# Patient Record
Sex: Female | Born: 1945
Health system: Southern US, Community
[De-identification: ages and names within clinical notes are randomized; demographics above are authoritative.]

## PROBLEM LIST (undated history)

## (undated) DIAGNOSIS — N760 Acute vaginitis: Secondary | ICD-10-CM

## (undated) DIAGNOSIS — E8941 Symptomatic postprocedural ovarian failure: Secondary | ICD-10-CM

## (undated) DIAGNOSIS — B029 Zoster without complications: Secondary | ICD-10-CM

## (undated) DIAGNOSIS — G4700 Insomnia, unspecified: Secondary | ICD-10-CM

## (undated) DIAGNOSIS — F411 Generalized anxiety disorder: Secondary | ICD-10-CM

## (undated) DIAGNOSIS — K589 Irritable bowel syndrome without diarrhea: Secondary | ICD-10-CM

## (undated) HISTORY — DX: Symptomatic postprocedural ovarian failure: E89.41

## (undated) HISTORY — PX: ABDOMINAL HYSTERECTOMY: SHX81

## (undated) HISTORY — DX: Generalized anxiety disorder: F41.1

## (undated) HISTORY — DX: Zoster without complications: B02.9

## (undated) HISTORY — PX: EYE SURGERY: SHX253

## (undated) HISTORY — PX: CATARACT EXTRACTION: SUR2

## (undated) HISTORY — PX: TONSILLECTOMY: SUR1361

## (undated) HISTORY — PX: COLECTOMY: SHX59

## (undated) HISTORY — DX: Insomnia, unspecified: G47.00

## (undated) HISTORY — DX: Acute vaginitis: N76.0

## (undated) HISTORY — DX: Irritable bowel syndrome without diarrhea: K58.9

## (undated) HISTORY — PX: TONSILLECTOMY: SHX5217

---

## 2001-07-03 ENCOUNTER — Ambulatory Visit (HOSPITAL_COMMUNITY): Admission: RE | Admit: 2001-07-03 | Discharge: 2001-07-03 | Payer: Self-pay | Admitting: Gastroenterology

## 2001-08-14 ENCOUNTER — Other Ambulatory Visit: Admission: RE | Admit: 2001-08-14 | Discharge: 2001-08-14 | Payer: Self-pay | Admitting: *Deleted

## 2002-11-04 ENCOUNTER — Other Ambulatory Visit: Admission: RE | Admit: 2002-11-04 | Discharge: 2002-11-04 | Payer: Self-pay | Admitting: *Deleted

## 2003-12-09 ENCOUNTER — Other Ambulatory Visit: Admission: RE | Admit: 2003-12-09 | Discharge: 2003-12-09 | Payer: Self-pay | Admitting: *Deleted

## 2004-04-12 ENCOUNTER — Encounter: Admission: RE | Admit: 2004-04-12 | Discharge: 2004-04-12 | Payer: Self-pay | Admitting: Family Medicine

## 2005-02-02 ENCOUNTER — Other Ambulatory Visit: Admission: RE | Admit: 2005-02-02 | Discharge: 2005-02-02 | Payer: Self-pay | Admitting: *Deleted

## 2006-02-21 ENCOUNTER — Other Ambulatory Visit: Admission: RE | Admit: 2006-02-21 | Discharge: 2006-02-21 | Payer: Self-pay | Admitting: *Deleted

## 2007-03-28 ENCOUNTER — Other Ambulatory Visit: Admission: RE | Admit: 2007-03-28 | Discharge: 2007-03-28 | Payer: Self-pay | Admitting: *Deleted

## 2007-08-13 ENCOUNTER — Encounter: Admission: RE | Admit: 2007-08-13 | Discharge: 2007-08-13 | Payer: Self-pay | Admitting: Family Medicine

## 2008-05-07 ENCOUNTER — Emergency Department (HOSPITAL_COMMUNITY): Admission: EM | Admit: 2008-05-07 | Discharge: 2008-05-07 | Payer: Self-pay | Admitting: Family Medicine

## 2008-08-14 ENCOUNTER — Ambulatory Visit: Payer: Self-pay | Admitting: Internal Medicine

## 2008-08-14 DIAGNOSIS — F411 Generalized anxiety disorder: Secondary | ICD-10-CM

## 2008-08-14 DIAGNOSIS — J069 Acute upper respiratory infection, unspecified: Secondary | ICD-10-CM | POA: Insufficient documentation

## 2008-08-14 DIAGNOSIS — E8941 Symptomatic postprocedural ovarian failure: Secondary | ICD-10-CM | POA: Insufficient documentation

## 2008-08-14 HISTORY — DX: Generalized anxiety disorder: F41.1

## 2008-08-14 HISTORY — DX: Symptomatic postprocedural ovarian failure: E89.41

## 2008-10-03 ENCOUNTER — Ambulatory Visit: Payer: Self-pay | Admitting: Family Medicine

## 2008-10-03 ENCOUNTER — Telehealth: Payer: Self-pay | Admitting: Family Medicine

## 2008-10-04 ENCOUNTER — Telehealth: Payer: Self-pay | Admitting: Family Medicine

## 2009-04-23 ENCOUNTER — Telehealth: Payer: Self-pay | Admitting: Internal Medicine

## 2009-08-30 ENCOUNTER — Telehealth: Payer: Self-pay | Admitting: Internal Medicine

## 2009-08-31 ENCOUNTER — Telehealth: Payer: Self-pay | Admitting: Internal Medicine

## 2009-09-01 ENCOUNTER — Telehealth: Payer: Self-pay

## 2009-09-07 ENCOUNTER — Ambulatory Visit: Payer: Self-pay | Admitting: Internal Medicine

## 2009-09-07 DIAGNOSIS — G4700 Insomnia, unspecified: Secondary | ICD-10-CM | POA: Insufficient documentation

## 2009-09-07 DIAGNOSIS — K589 Irritable bowel syndrome without diarrhea: Secondary | ICD-10-CM

## 2009-09-07 HISTORY — DX: Irritable bowel syndrome, unspecified: K58.9

## 2009-09-07 HISTORY — DX: Insomnia, unspecified: G47.00

## 2009-09-07 LAB — CONVERTED CEMR LAB
BUN: 16 mg/dL (ref 6–23)
Basophils Absolute: 0 10*3/uL (ref 0.0–0.1)
Bilirubin, Direct: 0 mg/dL (ref 0.0–0.3)
Cholesterol: 173 mg/dL (ref 0–200)
Eosinophils Relative: 1.2 % (ref 0.0–5.0)
Glucose, Bld: 91 mg/dL (ref 70–99)
HCT: 40 % (ref 36.0–46.0)
Hemoglobin: 13.2 g/dL (ref 12.0–15.0)
Lymphocytes Relative: 30.8 % (ref 12.0–46.0)
MCHC: 33.1 g/dL (ref 30.0–36.0)
Monocytes Relative: 9.3 % (ref 3.0–12.0)
Neutro Abs: 3.2 10*3/uL (ref 1.4–7.7)
Neutrophils Relative %: 58.3 % (ref 43.0–77.0)
RBC: 4.44 M/uL (ref 3.87–5.11)
RDW: 12.6 % (ref 11.5–14.6)
TSH: 1.42 microintl units/mL (ref 0.35–5.50)
Total Bilirubin: 0.6 mg/dL (ref 0.3–1.2)
Total Protein: 6.7 g/dL (ref 6.0–8.3)

## 2010-02-04 ENCOUNTER — Emergency Department (HOSPITAL_COMMUNITY): Admission: EM | Admit: 2010-02-04 | Discharge: 2010-02-05 | Payer: Self-pay | Admitting: Emergency Medicine

## 2010-02-22 ENCOUNTER — Ambulatory Visit: Payer: Self-pay | Admitting: Cardiology

## 2010-02-22 ENCOUNTER — Ambulatory Visit: Payer: Self-pay

## 2010-02-22 ENCOUNTER — Encounter (INDEPENDENT_AMBULATORY_CARE_PROVIDER_SITE_OTHER): Payer: Self-pay | Admitting: Neurology

## 2010-02-22 ENCOUNTER — Ambulatory Visit (HOSPITAL_COMMUNITY): Admission: RE | Admit: 2010-02-22 | Discharge: 2010-02-22 | Payer: Self-pay | Admitting: Neurology

## 2010-05-12 ENCOUNTER — Telehealth: Payer: Self-pay | Admitting: Internal Medicine

## 2010-06-04 ENCOUNTER — Ambulatory Visit: Payer: Self-pay | Admitting: Family Medicine

## 2010-06-04 DIAGNOSIS — R300 Dysuria: Secondary | ICD-10-CM

## 2010-06-04 DIAGNOSIS — N760 Acute vaginitis: Secondary | ICD-10-CM | POA: Insufficient documentation

## 2010-06-04 HISTORY — DX: Acute vaginitis: N76.0

## 2010-06-04 LAB — CONVERTED CEMR LAB
Blood in Urine, dipstick: NEGATIVE
Glucose, Urine, Semiquant: NEGATIVE
Ketones, urine, test strip: NEGATIVE
Protein, U semiquant: NEGATIVE
Specific Gravity, Urine: <1.005

## 2010-06-06 ENCOUNTER — Telehealth: Payer: Self-pay | Admitting: Internal Medicine

## 2010-06-07 ENCOUNTER — Ambulatory Visit (HOSPITAL_COMMUNITY): Payer: Self-pay | Admitting: Psychology

## 2010-07-14 ENCOUNTER — Ambulatory Visit: Payer: Self-pay | Admitting: Internal Medicine

## 2010-07-15 ENCOUNTER — Telehealth: Payer: Self-pay | Admitting: Internal Medicine

## 2010-07-24 LAB — HM PAP SMEAR: HM Pap smear: NORMAL

## 2010-08-25 NOTE — Assessment & Plan Note (Signed)
Summary: URI/dm   Vital Signs:  Patient profile:   65 year old female Weight:      110 pounds Temp:     98.2 degrees F oral BP sitting:   108 / 72  (left arm) Cuff size:   regular  Vitals Entered By: Duard Brady LPN (July 14, 2010 11:13 AM) CC: c/o chest congestion and weakness Is Patient Diabetic? No   CC:  c/o chest congestion and weakness.  History of Present Illness: 65 year old patient with a 7-day history of head and chest congestion, and minimally productive cough.  She has weakness, chest congestion, and his general sense of unwellness.  There is been no documented fever.  She denies any purulent sputum production, chest pain or shortness of breath.  Allergies: 1)  ! Compazine  Past History:  Past Medical History: Reviewed history from 08/14/2008 and no changes required. surgical menopause history of endometriosis Anxiety insomnia neck pain IBS  Family History: Reviewed history from 08/14/2008 and no changes required. father died age 82, lung cancer mother died age 52 complications of Hodgkin's disease  No siblings  Review of Systems       The patient complains of anorexia, hoarseness, and prolonged cough.  The patient denies fever, weight loss, weight gain, vision loss, decreased hearing, chest pain, syncope, dyspnea on exertion, peripheral edema, headaches, hemoptysis, abdominal pain, melena, hematochezia, severe indigestion/heartburn, hematuria, incontinence, genital sores, muscle weakness, suspicious skin lesions, transient blindness, difficulty walking, depression, unusual weight change, abnormal bleeding, enlarged lymph nodes, angioedema, and breast masses.    Physical Exam  General:  Well-developed,well-nourished,in no acute distress; alert,appropriate and cooperative throughout examination Head:  Normocephalic and atraumatic without obvious abnormalities. No apparent alopecia or balding. Eyes:  No corneal or conjunctival inflammation noted.  EOMI. Perrla. Funduscopic exam benign, without hemorrhages, exudates or papilledema. Vision grossly normal. Ears:  External ear exam shows no significant lesions or deformities.  Otoscopic examination reveals clear canals, tympanic membranes are intact bilaterally without bulging, retraction, inflammation or discharge. Hearing is grossly normal bilaterally. Nose:  External nasal examination shows no deformity or inflammation. Nasal mucosa are pink and moist without lesions or exudates. Mouth:  Oral mucosa and oropharynx without lesions or exudates.  Teeth in good repair. Neck:  No deformities, masses, or tenderness noted. Lungs:  Normal respiratory effort, chest expands symmetrically. Lungs are clear to auscultation, no crackles or wheezes. O2 saturation 99% Heart:  Normal rate and regular rhythm. S1 and S2 normal without gallop, murmur, click, rub or other extra sounds. Msk:  No deformity or scoliosis noted of thoracic or lumbar spine.   Extremities:  No clubbing, cyanosis, edema, or deformity noted with normal full range of motion of all joints.     Impression & Recommendations:  Problem # 1:  URI (ICD-465.9)  Her updated medication list for this problem includes:    Hydrocodone-homatropine 5-1.5 Mg/43ml Syrp (Hydrocodone-homatropine) .Marland Kitchen... 1 teaspoon every 6 hours as needed for cough or congestion  Her updated medication list for this problem includes:    Hydrocodone-homatropine 5-1.5 Mg/62ml Syrp (Hydrocodone-homatropine) .Marland Kitchen... 1 teaspoon every 6 hours as needed for cough or congestion  Complete Medication List: 1)  Prozac 10 Mg Caps (Fluoxetine hcl) .... 1/2 once daily 2)  Premarin 0.9 Mg Tabs (Estrogens conjugated) .Marland Kitchen.. 1 once daily 3)  Trazodone Hcl 50 Mg Tabs (Trazodone hcl) .... 1/2 at bedtime 4)  Alprazolam 0.25 Mg Tbdp (Alprazolam) .Marland Kitchen.. 1 twice daily 5)  Methocarbamol 500 Mg Tabs (Methocarbamol) .Marland Kitchen.. 1 once daily  as needed 6)  Fluticasone Propionate 50 Mcg/act Susp (Fluticasone  propionate) .... 2 sprays each nostril once daily 7)  Hydrocodone-homatropine 5-1.5 Mg/35ml Syrp (Hydrocodone-homatropine) .Marland Kitchen.. 1 teaspoon every 6 hours as needed for cough or congestion  Patient Instructions: 1)  Get plenty of rest, drink lots of clear liquids, and use Tylenol or Ibuprofen for fever and comfort. Return in 7-10 days if you're not better:sooner if you're feeling worse. 2)  Please schedule a follow-up appointment in 6 months for annual exam Prescriptions: HYDROCODONE-HOMATROPINE 5-1.5 MG/5ML SYRP (HYDROCODONE-HOMATROPINE) 1 teaspoon every 6 hours as needed for cough or congestion  #6 oz x 0   Entered and Authorized by:   Gordy Savers  MD   Signed by:   Gordy Savers  MD on 07/14/2010   Method used:   Print then Give to Patient   RxID:   [id number] FLUTICASONE PROPIONATE 50 MCG/ACT  SUSP (FLUTICASONE PROPIONATE) 2 sprays each nostril once daily  #1 x 4   Entered and Authorized by:   Gordy Savers  MD   Signed by:   Gordy Savers  MD on 07/14/2010   Method used:   Print then Give to Patient   RxID:   [id number] ALPRAZOLAM 0.25 MG TBDP (ALPRAZOLAM) 1 twice daily  #180 x 3   Entered and Authorized by:   Gordy Savers  MD   Signed by:   Gordy Savers  MD on 07/14/2010   Method used:   Print then Give to Patient   RxID:   [id number] TRAZODONE HCL 50 MG TABS (TRAZODONE HCL) 1/2 at bedtime  #90 x 4   Entered and Authorized by:   Gordy Savers  MD   Signed by:   Gordy Savers  MD on 07/14/2010   Method used:   Print then Give to Patient   RxID:   [id number] PREMARIN 0.9 MG TABS (ESTROGENS CONJUGATED) 1 once daily  #90 x 4   Entered and Authorized by:   Gordy Savers  MD   Signed by:   Gordy Savers  MD on 07/14/2010   Method used:   Print then Give to Patient   RxID:   [id number] PROZAC 10 MG CAPS (FLUOXETINE HCL) 1/2 once daily  #90 x 4   Entered and Authorized by:   Gordy Savers   MD   Signed by:   Gordy Savers  MD on 07/14/2010   Method used:   Print then Give to Patient   RxID:   [id number]    Orders Added: 1)  Est. Patient Level III [57322]

## 2010-08-25 NOTE — Progress Notes (Signed)
Summary: Call-A-Nurse Report  Call-A-Nurse Triage Call Report Triage Record Num: [id number] Operator: Caswell Corwin Patient Name: Elizabeth Ward Call Date & Time: 06/04/2010 10:05:29AM Patient Phone: [phone] PCP: Patient Gender: Female PCP Fax : Patient DOB: [birth year] [facility] Name: [facility] Reason for Call: Pt callint that she has UTI sx with bladder pain. Sx started 06/02/10 and frequency and urgency and burning. Last voided  ~ 1000. Triaged U/S SX female having above sx. No S/O per office guidelines. Pt inst to go to [facility] for eval. and then called office @ [phone] and transferred to [facility] for appt at [facility]. Protocol(s) Used: Urinary Symptoms - Female Recommended Outcome per Protocol: See Provider within 4 hours Reason for Outcome: Urinary tract symptoms AND any flank or low back pain Care Advice:  ~ Another adult should drive. 06/04/2010 10:18:05AM Page 1 of 1 [email or web address]

## 2010-08-25 NOTE — Progress Notes (Signed)
Summary: cough (appt [facility] at [facility])  Phone Note Call from Patient Call back at Home Phone [phone]   Caller: Patient Call For: DrTabori Summary of Call: c/o possible bronchitis, the past few days fatigue is worse,dry cough, sore throat, glands in throat swollen, chest pain with cough. C/o symptoms since end of Jan but have recently gotten worse, expectorants not helping. I scheduled appt with [facility] Initial call taken by: Liane Comber,  October 03, 2008 9:48 AM  Follow-up for Phone Call        agree Follow-up by: Neena Rhymes MD,  October 03, 2008 9:53 AM

## 2010-08-25 NOTE — Progress Notes (Signed)
  Phone Note Call from Patient   Summary of Call: was seen yesterday and calls today stating no better- sever sorethroat, sinusitis like signs with facial pain- cannot take z pack--[facility]-cough and chest congestion Initial call taken by: Willy Eddy, LPN,  July 15, 2010 2:57 PM  Follow-up for Phone Call        doxycycline 100 mg #20 one BID Follow-up by: Gordy Savers  MD,  July 15, 2010 4:48 PM    New/Updated Medications: DOXYCYCLINE HYCLATE 100 MG CAPS (DOXYCYCLINE HYCLATE) 1 two times a day for 10 days Prescriptions: DOXYCYCLINE HYCLATE 100 MG CAPS (DOXYCYCLINE HYCLATE) 1 two times a day for 10 days  #20 x 0   Entered by:   Willy Eddy, LPN   Authorized by:   Gordy Savers  MD   Signed by:   Willy Eddy, LPN on 40/98/1191   Method used:   [facility]        [facility] [address] [phone]* (retail)       [address]       [place], [place]  [address]       Ph: [phone] or [phone]       Fax: [phone]   RxID:   [id number]   Appended Document:  pt was instructed to check witgh pharmacy after 5pm if she didnt receive call from [place]

## 2010-08-25 NOTE — Progress Notes (Signed)
Summary[facility]  Phone Note Call from Patient   Caller: Patient Call For: Gordy Savers  MD Summary of Call: Spoke with pt. about Alprazolam , she has increased dose to 2 once daily due to family issues. Pharmacy will not refill at original dose, it is to early. She has appt next tues here, and is out of meds now. Will you ok increase in doseage and early refill?  KIK Initial call taken by: Raechel Ache, RN,  September 01, 2009 10:15 AM  Follow-up for Phone Call        [facility] in [facility] Dr. Amador Cunas , notified pt.  Encouraged to keep appt next week.  KIK Follow-up by: Duard Brady RN,  September 02, 2009 8:55 AM    alprazolam 0.25  #60 one twice daily as needed

## 2010-08-25 NOTE — Progress Notes (Signed)
Summary: Pt req early refill of Alprazolam  Phone Note Call from Patient Call back at Home Phone [phone]   Caller: Patient Summary of Call: Pt req early refill of Alprazolam .25mg . Pt says that she has been taking more than dosage instruction. Pt has had a lot of anxiety due family situation. Pt req 90 day supply. [facility] in to [facility].  Initial call taken by: Lucy Antigua,  August 30, 2009 10:29 AM    Prescriptions: ALPRAZOLAM 0.25 MG TBDP (ALPRAZOLAM) 1 once daily as needed  #90 x 0   Entered by:   Raechel Ache, RN   Authorized by:   Gordy Savers  MD   Signed by:   Raechel Ache, RN on 08/31/2009   Method used:   Printed then faxed to ...       [facility] [address] [phone]* (retail)       [address]       [place], [place]  [address]       Ph: [phone] or [phone]       Fax: [phone]   RxID:   [id number]  #90  needs ROV    , Called pt , informed of 90 day rx only , needs ROV for future Rx.  Will call back and make appt.  KIK

## 2010-08-25 NOTE — Progress Notes (Signed)
Summary: REQ FOR REFILL / RETURN CALL  Phone Note Call from Patient   Caller: Patient [phone] Reason for Call: Talk to Nurse, Talk to Doctor Summary of Call: Pt called to adv that she has been taking approx 2 alprazolam (xanax) tablets per day.... Pt increased amt she was taking daily due to current family stresses and increased anxiety.... Pt adv that she would need to have dosage increased because she has been taking an average of 2 pills per day.Marland KitchenMarland KitchenPt was adv that she may need to make an appt to obtain an increase in the dosage amt because she 1) would be increasing her dosage.... 2) she hasn't been seen by Dr Kirtland Bouchard in over a year...Marland KitchenMarland KitchenMarland Kitchen Pt scheduled an OV on 09/07/2009 @ 1:45pm w/ Dr Kirtland Bouchard for med ck / refills.  Pt states that she doesn't understand why Rx hasn't been called into pharmacy ([facility] [address]... Pharmacy advised pt that they have not received any refill authorizations from [facility].... Pt req a return call at [phone]  -or-  cell # [phone].  Initial call taken by: Debbra Riding,  August 31, 2009 3:54 PM

## 2010-08-25 NOTE — Assessment & Plan Note (Signed)
Summary: MED CK (REFILLS) // RS   Vital Signs:  Patient profile:   65 year old female Weight:      115 pounds Temp:     98.6 degrees F BP sitting:   108 / 64  (right arm) Cuff size:   regular  Vitals Entered By: Duard Brady LPN (September 07, 2009 1:52 PM) CC: medication review - refills needed , pt increased alprazolam to BID per self due to family stress   CC:  medication review - refills needed  and pt increased alprazolam to BID per self due to family stress.  History of Present Illness: 65 year old patient who established  here approximately  one year ago who is seen today for follow-up.  She has a history of anxiety, depression, surgical metapophysis and also a history of IBS.  She has been sleeping poorly and describes increased situational stress.  Her IBS is also acting up a bit with some increasingly loose and frequent stools  Allergies: 1)  ! Compazine  Past History:  Past Medical History: Reviewed history from 08/14/2008 and no changes required. surgical menopause history of endometriosis Anxiety insomnia neck pain IBS  Past Surgical History: Reviewed history from 08/14/2008 and no changes required. Colectomy partial 1992 for endometrioma Hysterectomy history of endometriosis 1983 Tonsillectomy 1954  Colonoscopy in 1995  Family History: Reviewed history from 08/14/2008 and no changes required. father died age 8, lung cancer mother died age 37 complications of Hodgkin's disease  No siblings  Review of Systems       The patient complains of anorexia and depression.  The patient denies fever, weight loss, weight gain, vision loss, decreased hearing, hoarseness, chest pain, syncope, dyspnea on exertion, peripheral edema, prolonged cough, headaches, hemoptysis, abdominal pain, melena, hematochezia, severe indigestion/heartburn, hematuria, incontinence, genital sores, muscle weakness, suspicious skin lesions, transient blindness, difficulty walking,  unusual weight change, abnormal bleeding, enlarged lymph nodes, angioedema, and breast masses.    Physical Exam  General:  Well-developed,well-nourished,in no acute distress; alert,appropriate and cooperative throughout examination Head:  Normocephalic and atraumatic without obvious abnormalities. No apparent alopecia or balding. Eyes:  No corneal or conjunctival inflammation noted. EOMI. Perrla. Funduscopic exam benign, without hemorrhages, exudates or papilledema. Vision grossly normal. Ears:  External ear exam shows no significant lesions or deformities.  Otoscopic examination reveals clear canals, tympanic membranes are intact bilaterally without bulging, retraction, inflammation or discharge. Hearing is grossly normal bilaterally. Mouth:  Oral mucosa and oropharynx without lesions or exudates.  Teeth in good repair. Neck:  No deformities, masses, or tenderness noted. Chest Wall:  No deformities, masses, or tenderness noted. Lungs:  Normal respiratory effort, chest expands symmetrically. Lungs are clear to auscultation, no crackles or wheezes. Heart:  Normal rate and regular rhythm. S1 and S2 normal without gallop, murmur, click, rub or other extra sounds. Abdomen:  Bowel sounds positive,abdomen soft and non-tender without masses, organomegaly or hernias noted. Msk:  No deformity or scoliosis noted of thoracic or lumbar spine.   Pulses:  R and L carotid,radial,femoral,dorsalis pedis and posterior tibial pulses are full and equal bilaterally Extremities:  No clubbing, cyanosis, edema, or deformity noted with normal full range of motion of all joints.   Skin:  Intact without suspicious lesions or rashes Cervical Nodes:  No lymphadenopathy noted Psych:  depressed affect.  depressed affect.     Impression & Recommendations:  Problem # 1:  ANXIETY (ICD-300.00)  Her updated medication list for this problem includes:    Prozac 10 Mg Caps (Fluoxetine hcl) .Marland KitchenMarland KitchenMarland KitchenMarland Kitchen  1/2 once daily    Trazodone Hcl  50 Mg Tabs (Trazodone hcl) .Marland Kitchen... 1/2 at bedtime    Alprazolam 0.25 Mg Tbdp (Alprazolam) .Marland Kitchen... 1 twice daily    Her updated medication list for this problem includes:    Prozac 10 Mg Caps (Fluoxetine hcl) .Marland Kitchen... 1/2 once daily    Trazodone Hcl 50 Mg Tabs (Trazodone hcl) .Marland Kitchen... 1/2 at bedtime    Alprazolam 0.25 Mg Tbdp (Alprazolam) .Marland Kitchen... 1 twice daily  Orders: Venipuncture (29562) TLB-BMP (Basic Metabolic Panel-BMET) (80048-METABOL) TLB-CBC Platelet - w/Differential (85025-CBCD) TLB-Hepatic/Liver Function Pnl (80076-HEPATIC) TLB-TSH (Thyroid Stimulating Hormone) (84443-TSH) TLB-Cholesterol, Total (82465-CHO)  Problem # 2:  IRRITABLE BOWEL SYNDROME (ICD-564.1)  Orders: Venipuncture (13086) TLB-BMP (Basic Metabolic Panel-BMET) (80048-METABOL) TLB-CBC Platelet - w/Differential (85025-CBCD) TLB-Hepatic/Liver Function Pnl (80076-HEPATIC) TLB-TSH (Thyroid Stimulating Hormone) (84443-TSH) TLB-Cholesterol, Total (82465-CHO)  Problem # 3:  INSOMNIA (ICD-780.52)  Orders: Venipuncture (57846) TLB-BMP (Basic Metabolic Panel-BMET) (80048-METABOL) TLB-CBC Platelet - w/Differential (85025-CBCD) TLB-Hepatic/Liver Function Pnl (80076-HEPATIC) TLB-TSH (Thyroid Stimulating Hormone) (84443-TSH) TLB-Cholesterol, Total (82465-CHO)  Complete Medication List: 1)  Prozac 10 Mg Caps (Fluoxetine hcl) .... 1/2 once daily 2)  Premarin 0.9 Mg Tabs (Estrogens conjugated) .Marland Kitchen.. 1 once daily 3)  Trazodone Hcl 50 Mg Tabs (Trazodone hcl) .... 1/2 at bedtime 4)  Alprazolam 0.25 Mg Tbdp (Alprazolam) .Marland Kitchen.. 1 twice daily 5)  Methocarbamol 500 Mg Tabs (Methocarbamol) .Marland Kitchen.. 1 once daily as needed 6)  Fluticasone Propionate 50 Mcg/act Susp (Fluticasone propionate) .... 2 sprays each nostril once daily  Patient Instructions: 1)  Please schedule a follow-up appointment in 6 months for annual exam 2)  Limit your Sodium (Salt). 3)  It is important that you exercise regularly at least 20 minutes 5 times a week. If you  develop chest pain, have severe difficulty breathing, or feel very tired , stop exercising immediately and seek medical attention. 4)  Schedule your mammogram. 5)  Schedule a colonoscopy/sigmoidoscopy to help detect colon cancer. 6)  Take calcium +Vitamin D daily. Prescriptions: METHOCARBAMOL 500 MG TABS (METHOCARBAMOL) 1 once daily as needed  #50 x 4   Entered and Authorized by:   Gordy Savers  MD   Signed by:   Gordy Savers  MD on 09/07/2009   Method used:   Print then Give to Patient   RxID:   [id number] ALPRAZOLAM 0.25 MG TBDP (ALPRAZOLAM) 1 twice daily  #180 x 4   Entered and Authorized by:   Gordy Savers  MD   Signed by:   Gordy Savers  MD on 09/07/2009   Method used:   Print then Give to Patient   RxID:   [id number] FLUTICASONE PROPIONATE 50 MCG/ACT  SUSP (FLUTICASONE PROPIONATE) 2 sprays each nostril once daily  #1 x 4   Entered and Authorized by:   Gordy Savers  MD   Signed by:   Gordy Savers  MD on 09/07/2009   Method used:   Print then Give to Patient   RxID:   [id number] ALPRAZOLAM 0.25 MG TBDP (ALPRAZOLAM) 1 once daily as needed  #90 x 4   Entered and Authorized by:   Gordy Savers  MD   Signed by:   Gordy Savers  MD on 09/07/2009   Method used:   Print then Give to Patient   RxID:   [id number] TRAZODONE HCL 50 MG TABS (TRAZODONE HCL) 1/2 at bedtime  #90 x 4   Entered and Authorized by:   Gordy Savers  MD   Signed by:  Gordy Savers  MD on 09/07/2009   Method used:   Print then Give to Patient   RxID:   [id number] PREMARIN 0.9 MG TABS (ESTROGENS CONJUGATED) 1 once daily  #90 x 4   Entered and Authorized by:   Gordy Savers  MD   Signed by:   Gordy Savers  MD on 09/07/2009   Method used:   Print then Give to Patient   RxID:   [id number] PROZAC 10 MG CAPS (FLUOXETINE HCL) 1/2 once daily  #90 x 4   Entered and Authorized by:   Gordy Savers  MD    Signed by:   Gordy Savers  MD on 09/07/2009   Method used:   Print then Give to Patient   RxID:   [id number]

## 2010-08-25 NOTE — Assessment & Plan Note (Signed)
Summary: UTI/RCD   Vital Signs:  Patient profile:   65 year old female Weight:      112 pounds BMI:     20.23 Temp:     98.0 degrees F oral Pulse rate:   79 / minute Pulse rhythm:   regular BP sitting:   124 / 70  (left arm) Cuff size:   regular  Vitals Entered By: Lamar Sprinkles, CMA (June 04, 2010 11:19 AM) CC: C/o urinary burning, pain & frequency x 2 days/SD   History of Present Illness: 65 yo with 2 days of increased urinary frequency and dysuria. Mild vaginal irritation, no discharge. No fevers, chill, nausea, vomiting or back pain.  Used to get frequent UTIs, has not had one in awhile. No visible hematuria. No suprapubic pain.    Current Medications (verified): 1)  Prozac 10 Mg Caps (Fluoxetine Hcl) .... 1/2 Once Daily 2)  Premarin 0.9 Mg Tabs (Estrogens Conjugated) .Marland Kitchen.. 1 Once Daily 3)  Trazodone Hcl 50 Mg Tabs (Trazodone Hcl) .... 1/2 At Bedtime 4)  Alprazolam 0.25 Mg Tbdp (Alprazolam) .Marland Kitchen.. 1 Twice Daily 5)  Methocarbamol 500 Mg Tabs (Methocarbamol) .Marland Kitchen.. 1 Once Daily As Needed 6)  Fluticasone Propionate 50 Mcg/act  Susp (Fluticasone Propionate) .... 2 Sprays Each Nostril Once Daily  Allergies (verified): 1)  ! Compazine  Past History:  Past Medical History: Last updated: 09-02-08 surgical menopause history of endometriosis Anxiety insomnia neck pain IBS  Past Surgical History: Last updated: 2008/09/02 Colectomy partial 1992 for endometrioma Hysterectomy history of endometriosis 1983 Tonsillectomy 1954  Colonoscopy in 1995  Family History: Last updated: 02-Sep-2008 father died age 81, lung cancer mother died age 87 complications of Hodgkin's disease  No siblings  Social History: Last updated: 2008-09-02 Married Never Smoked one son  Risk Factors: Smoking Status: never (09/02/08)  Review of Systems      See HPI General:  Denies fever. GI:  Denies abdominal pain. GU:  Complains of dysuria and urinary frequency; denies discharge,  hematuria, and incontinence.  Physical Exam  General:  Well-developed,well-nourished,in no acute distress; alert,appropriate and cooperative throughout examination Abdomen:  NO suprapubic tenderness Genitalia:  normal introitus, no external lesions, no vaginal discharge, mucosa pink and moist, and no vaginal or cervical lesions.   Msk:  No CVA tenderness Psych:  depressed affect.     Impression & Recommendations:  Problem # 1:  DYSURIA (ICD-788.1) Assessment New UA completely negative, will send for culture to rule out infection.  Advised drinking fluids, can use OTC Azo if it helps with symptoms. Orders: UA Dipstick w/o Micro (manual) (16109) T-Culture, Urine ([id number])  Problem # 2:  UNSPECIFIED VAGINITIS AND VULVOVAGINITIS (ICD-616.10) Assessment: New Vaginal exam unremarkable.  Wet prep sent. Orders: Wet Prep ([id number])  Complete Medication List: 1)  Prozac 10 Mg Caps (Fluoxetine hcl) .... 1/2 once daily 2)  Premarin 0.9 Mg Tabs (Estrogens conjugated) .Marland Kitchen.. 1 once daily 3)  Trazodone Hcl 50 Mg Tabs (Trazodone hcl) .... 1/2 at bedtime 4)  Alprazolam 0.25 Mg Tbdp (Alprazolam) .Marland Kitchen.. 1 twice daily 5)  Methocarbamol 500 Mg Tabs (Methocarbamol) .Marland Kitchen.. 1 once daily as needed 6)  Fluticasone Propionate 50 Mcg/act Susp (Fluticasone propionate) .... 2 sprays each nostril once daily   Orders Added: 1)  UA Dipstick w/o Micro (manual) [81002] 2)  T-Culture, Urine [[id number]] 3)  Wet Prep [84696EX] 4)  Est. Patient Level IV [52841]   Not Administered:    Influenza Vaccine not given due to: declined   Laboratory Results   Urine Tests  Date/Time Reported: Lamar Sprinkles, CMA  June 04, 2010 11:25 AM   Routine Urinalysis   Color: yellow Glucose: negative   (Normal Range: Negative) Bilirubin: negative   (Normal Range: Negative) Ketone: negative   (Normal Range: Negative) Spec. Gravity: <1.005   (Normal Range: 1.003-1.035) Blood: negative   (Normal Range: Negative) pH:  6.0   (Normal Range: 5.0-8.0) Protein: negative   (Normal Range: Negative) Urobilinogen: negative   (Normal Range: 0-1) Nitrite: negative   (Normal Range: Negative) Leukocyte Esterace: negative   (Normal Range: Negative)

## 2010-08-25 NOTE — Progress Notes (Signed)
Summary: REFILL REQUEST  Phone Note Refill Request   Refills Requested: Medication #1:  ALPRAZOLAM 0.25 MG TBDP 1 twice daily   Notes: [facility] [address]     Initial call taken by: Debbra Riding,  May 12, 2010 3:57 PM  Follow-up for Phone Call        #90  RF 3 Follow-up by: Gordy Savers  MD,  May 12, 2010 4:39 PM    Prescriptions: ALPRAZOLAM 0.25 MG TBDP (ALPRAZOLAM) 1 twice daily  #180 x 3   Entered by:   Willy Eddy, LPN   Authorized by:   Gordy Savers  MD   Signed by:   Willy Eddy, LPN on 04/54/0981   Method used:   Telephoned to ...       [facility] [address] [phone]* (retail)       [address]       [place], [place]  [address]       Ph: [phone] or [phone]       Fax: [phone]   RxID:   [id number]

## 2010-09-23 ENCOUNTER — Other Ambulatory Visit: Payer: Self-pay | Admitting: Internal Medicine

## 2010-10-08 LAB — CBC
HCT: 39 % (ref 36.0–46.0)
Hemoglobin: 13.2 g/dL (ref 12.0–15.0)
MCV: 88.7 fL (ref 78.0–100.0)
Platelets: 250 10*3/uL (ref 150–400)
WBC: 7.3 10*3/uL (ref 4.0–10.5)

## 2010-10-08 LAB — DIFFERENTIAL
Lymphocytes Relative: 34 % (ref 12–46)
Lymphs Abs: 2.5 10*3/uL (ref 0.7–4.0)
Monocytes Relative: 9 % (ref 3–12)

## 2010-10-08 LAB — COMPREHENSIVE METABOLIC PANEL
ALT: 15 U/L (ref 0–35)
AST: 16 U/L (ref 0–37)
Albumin: 3.8 g/dL (ref 3.5–5.2)
Alkaline Phosphatase: 38 U/L — ABNORMAL LOW (ref 39–117)
BUN: 17 mg/dL (ref 6–23)
Calcium: 9.3 mg/dL (ref 8.4–10.5)
Chloride: 104 mEq/L (ref 96–112)
GFR calc Af Amer: >60 mL/min (ref >60)
Potassium: 3.8 mEq/L (ref 3.5–5.1)
Total Protein: 6.6 g/dL (ref 6.0–8.3)

## 2010-10-08 LAB — CK TOTAL AND CKMB (NOT AT ARMC): CK, MB: 3.5 ng/mL (ref 0.3–4.0)

## 2010-10-08 LAB — PROTIME-INR: INR: 0.93 (ref 0.00–1.49)

## 2010-12-20 ENCOUNTER — Other Ambulatory Visit: Payer: Self-pay | Admitting: Internal Medicine

## 2011-03-28 ENCOUNTER — Other Ambulatory Visit: Payer: Self-pay | Admitting: Internal Medicine

## 2011-05-11 ENCOUNTER — Other Ambulatory Visit: Payer: Self-pay | Admitting: Internal Medicine

## 2011-05-11 NOTE — Telephone Encounter (Signed)
Suggest rov next week

## 2011-05-11 NOTE — Telephone Encounter (Signed)
Please advise - last seen 06/2010

## 2011-05-11 NOTE — Telephone Encounter (Signed)
Pt called to request a refill on ALPRAZOLAM 0.25 MG TBDP (ALPRAZOLAM). Pt is also requesting an increase in dose from 2 pills a day to 3. Please contact

## 2011-05-11 NOTE — Telephone Encounter (Signed)
Spoke with pt - will need to be seen - appt made for tomorrow - pt states she will run out of meds soon

## 2011-05-12 ENCOUNTER — Ambulatory Visit (INDEPENDENT_AMBULATORY_CARE_PROVIDER_SITE_OTHER): Payer: 59 | Admitting: Internal Medicine

## 2011-05-12 ENCOUNTER — Encounter: Payer: Self-pay | Admitting: Internal Medicine

## 2011-05-12 DIAGNOSIS — Z23 Encounter for immunization: Secondary | ICD-10-CM

## 2011-05-12 DIAGNOSIS — G4700 Insomnia, unspecified: Secondary | ICD-10-CM

## 2011-05-12 DIAGNOSIS — Z0000 Encounter for general adult medical examination without abnormal findings: Secondary | ICD-10-CM

## 2011-05-12 DIAGNOSIS — F411 Generalized anxiety disorder: Secondary | ICD-10-CM

## 2011-05-12 MED ORDER — ALPRAZOLAM 0.25 MG PO TABS: 0.2500 mg | ORAL_TABLET | Freq: Three times a day (TID) | ORAL | Status: DC | PRN

## 2011-05-12 MED ORDER — TRAZODONE HCL 50 MG PO TABS: 50.0000 mg | ORAL_TABLET | Freq: Every day | ORAL | Status: DC

## 2011-05-12 MED ORDER — FLUOXETINE HCL 10 MG PO CAPS: 10.0000 mg | ORAL_CAPSULE | Freq: Every day | ORAL | Status: DC

## 2011-05-12 NOTE — Patient Instructions (Signed)
It is important that you exercise regularly, at least 20 minutes 3 to 4 times per week.  If you develop chest pain or shortness of breath seek  medical attention.  Call or return to clinic prn if these symptoms worsen or fail to improve as anticipated.  Schedule an annual physical in 6 months

## 2011-05-12 NOTE — Progress Notes (Signed)
  Subjective:    Patient ID: Elizabeth Ward, female    DOB: [birth year], 65 y.o.   MRN: [id number]  HPI A 65 year old patient who is seen today for followup. She has a history of insomnia and chronic anxiety. She has been on Xanax as well as low-dose fluoxetine. Recent stressors have included repair of a 34-year-old grandchild. The father and mother are separating and both are not in a position to care for the child. She has had increasing anxiety and poor sleep. She has required more alprazolam due to the increased stress   Review of Systems  Psychiatric/Behavioral: Positive for sleep disturbance and dysphoric mood. The patient is nervous/anxious.        Objective:   Physical Exam  Constitutional: She is oriented to person, place, and time. She appears well-developed and well-nourished.  HENT:  Head: Normocephalic.  Right Ear: External ear normal.  Left Ear: External ear normal.  Mouth/Throat: Oropharynx is clear and moist.  Eyes: Conjunctivae and EOM are normal. Pupils are equal, round, and reactive to light.  Neck: Normal range of motion. Neck supple. No thyromegaly present.  Cardiovascular: Normal rate, regular rhythm, normal heart sounds and intact distal pulses.   Pulmonary/Chest: Effort normal and breath sounds normal.  Abdominal: Soft. Bowel sounds are normal. She exhibits no mass. There is no tenderness.  Musculoskeletal: Normal range of motion.  Lymphadenopathy:    She has no cervical adenopathy.  Neurological: She is alert and oriented to person, place, and time.  Skin: Skin is warm and dry. No rash noted.  Psychiatric: She has a normal mood and affect. Her behavior is normal.          Assessment & Plan:   Situational stress. Insomnia.  Medications refilled. Behavioral health referral discussed she will consider

## 2011-06-20 ENCOUNTER — Encounter: Payer: Self-pay | Admitting: Internal Medicine

## 2011-07-12 ENCOUNTER — Other Ambulatory Visit: Payer: Self-pay | Admitting: Internal Medicine

## 2011-08-11 ENCOUNTER — Other Ambulatory Visit: Payer: Self-pay

## 2011-08-11 MED ORDER — ALPRAZOLAM 0.25 MG PO TABS: 0.2500 mg | ORAL_TABLET | Freq: Three times a day (TID) | ORAL | Status: DC | PRN

## 2011-10-06 ENCOUNTER — Encounter: Payer: Self-pay | Admitting: Internal Medicine

## 2011-10-06 ENCOUNTER — Ambulatory Visit (INDEPENDENT_AMBULATORY_CARE_PROVIDER_SITE_OTHER): Payer: Managed Care, Other (non HMO) | Admitting: Internal Medicine

## 2011-10-06 VITALS — BP 110/76 | Temp 98.1°F | Wt 113.0 lb

## 2011-10-06 DIAGNOSIS — J069 Acute upper respiratory infection, unspecified: Secondary | ICD-10-CM

## 2011-10-06 DIAGNOSIS — F411 Generalized anxiety disorder: Secondary | ICD-10-CM

## 2011-10-06 MED ORDER — HYDROCODONE-HOMATROPINE 5-1.5 MG/5ML PO SYRP: 5.0000 mL | ORAL_SOLUTION | Freq: Four times a day (QID) | ORAL | Status: AC | PRN

## 2011-10-06 NOTE — Progress Notes (Signed)
  Subjective:    Patient ID: Elizabeth Ward, female    DOB: 09/04/1945, 66 y.o.   MRN: [id number]  HPI  66 year old patient who presents with a 3 to four-day history of nonproductive cough. She does have some very mild sore throat no fever several family members have been ill she has been using Robitussin with minimal benefit. Cough was severe interfering with sleep last night and she has developed significant chest wall discomfort due to the refractory coughing    Review of Systems  Constitutional: Positive for activity change, appetite change and fatigue.  HENT: Positive for congestion. Negative for hearing loss, sore throat, rhinorrhea, dental problem, sinus pressure and tinnitus.   Eyes: Negative for pain, discharge and visual disturbance.  Respiratory: Positive for cough. Negative for shortness of breath.   Cardiovascular: Positive for chest pain. Negative for palpitations and leg swelling.  Gastrointestinal: Negative for nausea, vomiting, abdominal pain, diarrhea, constipation, blood in stool and abdominal distention.  Genitourinary: Negative for dysuria, urgency, frequency, hematuria, flank pain, vaginal bleeding, vaginal discharge, difficulty urinating, vaginal pain and pelvic pain.  Musculoskeletal: Negative for joint swelling, arthralgias and gait problem.  Skin: Negative for rash.  Neurological: Negative for dizziness, syncope, speech difficulty, weakness, numbness and headaches.  Hematological: Negative for adenopathy.  Psychiatric/Behavioral: Negative for behavioral problems, dysphoric mood and agitation. The patient is not nervous/anxious.        Objective:   Physical Exam  Constitutional: She is oriented to person, place, and time. She appears well-developed and well-nourished.  HENT:  Head: Normocephalic.  Right Ear: External ear normal.  Left Ear: External ear normal.  Mouth/Throat: Oropharynx is clear and moist.  Eyes: Conjunctivae and EOM are normal. Pupils are equal,  round, and reactive to light.  Neck: Normal range of motion. Neck supple. No thyromegaly present.  Cardiovascular: Normal rate, regular rhythm, normal heart sounds and intact distal pulses.   Pulmonary/Chest: Effort normal and breath sounds normal. No respiratory distress. She has no wheezes. She has no rales.  Abdominal: She exhibits no mass. There is no tenderness.  Musculoskeletal: Normal range of motion.  Lymphadenopathy:    She has no cervical adenopathy.  Neurological: She is alert and oriented to person, place, and time.  Skin: Skin is warm and dry. No rash noted.  Psychiatric: She has a normal mood and affect. Her behavior is normal.          Assessment & Plan:   Viral URI with cough. Will treat symptomatically Anxiety disorder stable we'll continue present treatment  Return when necessary

## 2011-10-06 NOTE — Patient Instructions (Signed)
Get plenty of rest, Drink lots of  clear liquids, and use Tylenol or ibuprofen for fever and discomfort.    

## 2011-10-18 ENCOUNTER — Other Ambulatory Visit: Payer: Self-pay | Admitting: Internal Medicine

## 2011-10-18 NOTE — Telephone Encounter (Signed)
ok 

## 2011-10-18 NOTE — Telephone Encounter (Signed)
Please advise - pt is requesting a 90 day supply

## 2011-10-18 NOTE — Telephone Encounter (Signed)
Pt requesting new  rx be called on for ALPRAZolam (XANAX) 0.25 MG tablet. Pt is requesting a 90 day supply so that her insurance will cover it  [facility]

## 2011-10-18 NOTE — Telephone Encounter (Signed)
Is it ok to fill with #270 ? TID PRN are the directions

## 2011-10-19 ENCOUNTER — Other Ambulatory Visit: Payer: Self-pay | Admitting: Internal Medicine

## 2011-10-19 NOTE — Telephone Encounter (Signed)
#  90  With RF

## 2011-10-19 NOTE — Telephone Encounter (Signed)
I informed that this is a prn med - and # 90 in 90day is what dr. Amador Cunas has okay'd - pt states she takes tid every day.  Please advise

## 2011-10-19 NOTE — Telephone Encounter (Signed)
Spoke with pt- informed that per dr. Amador Cunas - he will only disp # 90

## 2011-10-19 NOTE — Telephone Encounter (Signed)
Spoke with pharmacy - instructed #90 to last 90 days - this is a PRN med. KIK

## 2011-10-19 NOTE — Telephone Encounter (Signed)
Pt stated INS is requiring her to obtain 90 day supply #[facility]. Pt takes med three times a day.

## 2011-10-19 NOTE — Telephone Encounter (Signed)
#  90  RF 3 

## 2011-10-19 NOTE — Telephone Encounter (Signed)
No  #90 max

## 2011-10-19 NOTE — Telephone Encounter (Signed)
Pt takes tid every day - requesting 90 day rx per ins. Co. - that would be # 270 - please advise

## 2011-10-20 ENCOUNTER — Emergency Department (HOSPITAL_COMMUNITY): Payer: 59

## 2011-10-20 ENCOUNTER — Emergency Department (HOSPITAL_COMMUNITY)
Admission: EM | Admit: 2011-10-20 | Discharge: 2011-10-20 | Disposition: A | Discharge status: Home / Self Care | Payer: 59 | Attending: Emergency Medicine | Admitting: Emergency Medicine

## 2011-10-20 ENCOUNTER — Encounter (HOSPITAL_COMMUNITY): Payer: Self-pay | Admitting: *Deleted

## 2011-10-20 DIAGNOSIS — J3489 Other specified disorders of nose and nasal sinuses: Secondary | ICD-10-CM | POA: Insufficient documentation

## 2011-10-20 DIAGNOSIS — R51 Headache: Secondary | ICD-10-CM | POA: Insufficient documentation

## 2011-10-20 MED ORDER — HYDROCODONE-ACETAMINOPHEN 5-500 MG PO TABS: 1.0000 | ORAL_TABLET | Freq: Four times a day (QID) | ORAL | Status: AC | PRN

## 2011-10-20 NOTE — ED Provider Notes (Signed)
History     CSN: [id number]  Arrival date & time 10/20/11  1312   First MD Initiated Contact with Patient 10/20/11 1400      Chief Complaint  Patient presents with  . Facial Pain  . Sinusitis    (Consider location/radiation/quality/duration/timing/severity/associated sxs/prior treatment) Patient is a 66 y.o. female presenting with sinusitis. The history is provided by the patient. No language interpreter was used.  Sinusitis  This is a new problem. The current episode started more than 2 days ago. The problem has been gradually worsening. There has been no fever. The pain is moderate. The pain has been fluctuating since onset. Associated symptoms include sinus pressure. Pertinent negatives include no chills, no congestion, no ear pain, no sore throat, no swollen glands, no cough and no shortness of breath. She has tried other medications for the symptoms. The treatment provided mild relief.  Patient reports pain in right maxillary area, started earlier this week and has progressively worsened. Minimal nasal discharge, mostly clear, non-purulent. Past Medical History  Diagnosis Date  . ANXIETY 08/14/2008  . INSOMNIA 09/07/2009  . Irritable bowel syndrome 09/07/2009  . MENOPAUSE, SURGICAL 08/14/2008  . UNSPECIFIED VAGINITIS AND VULVOVAGINITIS 06/04/2010    Past Surgical History  Procedure Date  . Abdominal hysterectomy   . Tonsillectomy   . Colectomy     partial  . Tonsillectomy     No family history on file.  History  Substance Use Topics  . Smoking status: Never Smoker   . Smokeless tobacco: Never Used  . Alcohol Use: Yes    OB History    Grav Para Term Preterm Abortions TAB SAB Ect Mult Living                  Review of Systems  Constitutional: Negative for chills.  HENT: Positive for rhinorrhea and sinus pressure. Negative for ear pain, congestion and sore throat.   Respiratory: Negative for cough and shortness of breath.   All other systems reviewed and are  negative.    Allergies  Prochlorperazine edisylate  Home Medications   Current Outpatient Rx  Name Route Sig Dispense Refill  . ALPRAZOLAM 0.25 MG PO TABS Oral Take 1 tablet (0.25 mg total) by mouth 3 (three) times daily as needed for sleep. 90 tablet 2  . ESTROGENS CONJUGATED 0.9 MG PO TABS Oral Take 0.9 mg by mouth daily. Take daily for 21 days then do not take for 7 days.    Marland Kitchen FLUOXETINE HCL 10 MG PO TABS Oral Take 5 mg by mouth daily.    . TRAZODONE HCL 50 MG PO TABS Oral Take 25 mg by mouth at bedtime.      BP 113/54  Pulse 71  Temp(Src) 98.2 F (36.8 C) (Oral)  Resp 20  Ht 5' 2.5" (1.588 m)  Wt 112 lb (50.803 kg)  BMI 20.16 kg/m2  SpO2 100%  Physical Exam  Nursing note and vitals reviewed. Constitutional: She is oriented to person, place, and time. She appears well-developed and well-nourished.  HENT:  Head: Normocephalic.    Nose: Right sinus exhibits maxillary sinus tenderness.  Mouth/Throat: Oropharynx is clear and moist. No oropharyngeal exudate.  Eyes: Conjunctivae are normal. Pupils are equal, round, and reactive to light.  Neck: Normal range of motion. Neck supple.  Cardiovascular: Normal rate, regular rhythm, normal heart sounds and intact distal pulses.   Pulmonary/Chest: Effort normal and breath sounds normal.  Abdominal: Soft. Bowel sounds are normal.  Musculoskeletal: Normal range of motion.  Neurological: She is alert and oriented to person, place, and time.  Skin: Skin is warm and dry.  Psychiatric: She has a normal mood and affect. Her behavior is normal. Judgment and thought content normal.    ED Course  Procedures (including critical care time)  Labs Reviewed - No data to display No results found.   No diagnosis found.  Discussed with Dr. Dossie Der obtain CT.  3:08 PM  CT results reviewed and discussed with patient.  No findings to explain symptoms.  Will continue with anti-inflammatory and provide rx for additional analgesia.  MDM           Jimmye Norman, NP 10/20/11 1508  Jimmye Norman, NP 10/20/11 [phone]

## 2011-10-20 NOTE — ED Provider Notes (Signed)
Medical screening examination/treatment/procedure(s) were performed by non-physician practitioner and as supervising physician I was immediately available for consultation/collaboration.    Nelia Shi, MD 10/20/11 2252

## 2011-10-20 NOTE — Discharge Instructions (Signed)
Your workup today does not reveal the cause of your facial pain.  The CT scan was negative for sinusitis or other abnormality that would explain your symptoms.  Continue with the ibuprofen as discussed and reserve the vicodin for uncontrolled pain.  Follow-up with Dr. Maggie Schwalbe if symptoms persist.

## 2011-10-20 NOTE — ED Notes (Signed)
C/o sinus pain, pressure to right side of nose. C/o minimal nasal congestion & puffy eyes. denis fever, cold, cough, sore throat, ear ache, dizziness. Reports pain worse when bends forward & head is down

## 2011-10-20 NOTE — ED Notes (Signed)
Patient transported to CT 

## 2011-10-20 NOTE — ED Notes (Signed)
Patient complains of right sided sinus pain.  She was unable to see her md.

## 2011-10-23 ENCOUNTER — Telehealth: Payer: Self-pay | Admitting: Family Medicine

## 2011-10-23 NOTE — Telephone Encounter (Signed)
[facility] Triage Call Report Triage Record Num: [id number] Operator: Durward Mallard DiMatteis Patient Name: Elizabeth Ward Call Date & Time: 10/20/2011 11:34:04AM Patient Phone: [phone] PCP: Gordy Savers Patient Gender: Female PCP Fax : [phone] Patient DOB: 07-01-1946 [facility] Name[facility] Reason for Call: Caller: Kalinda/Patient; PCP: Eleonore Chiquito; CB#: [phone]; Call regarding pain in right side of nose, looks red and inflamed when she looks with a flashlight; sx started 10/16/11; pain is located around the rt side of nose and eye area; Triaged per Face Pain or Swelling Guideline; See in 4 hr d/t tissue surrounding eye is red swollen and tender; will comply and go to [facility]; homecare given as well; Protocol(s) Used: Face Pain or Swelling Recommended Outcome per Protocol: See Provider within 4 hours Reason for Outcome: Tissue surrounding eye is red, swollen and tender Care Advice: Apply warm, moist soaks or compresses to the affected area for 20-30 minutes 3 to 4 times per day. Avoid burning skin by using water no hotter than bath water and by not lying on the compresses. ~ ~ SYMPTOM / CONDITION MANAGEMENT ~ List, or take, all current prescription(s), nonprescription or alternative medication(s) to provider for evaluation. Analgesic/Antipyretic Advice - Acetaminophen: Consider acetaminophen as directed on label or by pharmacist/provider for pain or fever PRECAUTIONS: - Use if there is no history of liver disease, alcoholism, or intake of three or more alcohol drinks per day - Only if approved by provider during pregnancy or when breastfeeding - During pregnancy, acetaminophen should not be taken more than 3 consecutive days without telling provider - Do not exceed recommended dose or frequency ~ 10/20/2011 11:43:53AM Page 1 of 1 CAN_TriageRpt

## 2011-11-01 ENCOUNTER — Other Ambulatory Visit: Payer: Self-pay | Admitting: Internal Medicine

## 2011-11-16 ENCOUNTER — Other Ambulatory Visit: Payer: Self-pay

## 2011-11-16 MED ORDER — ALPRAZOLAM 0.25 MG PO TABS: 0.2500 mg | ORAL_TABLET | Freq: Three times a day (TID) | ORAL | Status: DC | PRN

## 2012-01-01 ENCOUNTER — Other Ambulatory Visit: Payer: Self-pay | Admitting: Internal Medicine

## 2012-02-15 ENCOUNTER — Other Ambulatory Visit: Payer: Self-pay

## 2012-02-15 MED ORDER — ALPRAZOLAM 0.25 MG PO TABS: 0.2500 mg | ORAL_TABLET | Freq: Three times a day (TID) | ORAL | Status: DC | PRN

## 2012-03-11 ENCOUNTER — Encounter (HOSPITAL_COMMUNITY): Payer: Self-pay | Admitting: Emergency Medicine

## 2012-03-11 ENCOUNTER — Emergency Department (HOSPITAL_COMMUNITY): Payer: 59

## 2012-03-11 ENCOUNTER — Emergency Department (HOSPITAL_COMMUNITY)
Admission: EM | Admit: 2012-03-11 | Discharge: 2012-03-11 | Disposition: A | Discharge status: Home / Self Care | Payer: 59 | Attending: Emergency Medicine | Admitting: Emergency Medicine

## 2012-03-11 DIAGNOSIS — G4700 Insomnia, unspecified: Secondary | ICD-10-CM | POA: Insufficient documentation

## 2012-03-11 DIAGNOSIS — R1110 Vomiting, unspecified: Secondary | ICD-10-CM | POA: Insufficient documentation

## 2012-03-11 DIAGNOSIS — K589 Irritable bowel syndrome without diarrhea: Secondary | ICD-10-CM | POA: Insufficient documentation

## 2012-03-11 DIAGNOSIS — F4321 Adjustment disorder with depressed mood: Secondary | ICD-10-CM

## 2012-03-11 LAB — CBC WITH DIFFERENTIAL/PLATELET
Basophils Relative: 0 % (ref 0–1)
Eosinophils Absolute: 0 10*3/uL (ref 0.0–0.7)
HCT: 40 % (ref 36.0–46.0)
Hemoglobin: 13.9 g/dL (ref 12.0–15.0)
Lymphs Abs: 1.1 10*3/uL (ref 0.7–4.0)
MCH: 29 pg (ref 26.0–34.0)
MCHC: 34.8 g/dL (ref 30.0–36.0)
Monocytes Absolute: 0.3 10*3/uL (ref 0.1–1.0)
Monocytes Relative: 3 % (ref 3–12)
Neutro Abs: 10.3 10*3/uL — ABNORMAL HIGH (ref 1.7–7.7)
Neutrophils Relative %: 88 % — ABNORMAL HIGH (ref 43–77)
RBC: 4.79 MIL/uL (ref 3.87–5.11)

## 2012-03-11 LAB — BASIC METABOLIC PANEL
BUN: 15 mg/dL (ref 6–23)
Chloride: 99 mEq/L (ref 96–112)
Creatinine, Ser: 0.6 mg/dL (ref 0.50–1.10)
Glucose, Bld: 145 mg/dL — ABNORMAL HIGH (ref 70–99)
Potassium: 3.5 mEq/L (ref 3.5–5.1)

## 2012-03-11 MED ORDER — KETOROLAC TROMETHAMINE 30 MG/ML IJ SOLN
30.0000 mg | Freq: Once | INTRAMUSCULAR | Status: AC
  Administered 2012-03-11: 30 mg via INTRAVENOUS
  Filled 2012-03-11: qty 1

## 2012-03-11 MED ORDER — ONDANSETRON HCL 4 MG PO TABS: 4.0000 mg | ORAL_TABLET | Freq: Three times a day (TID) | ORAL | Status: AC | PRN

## 2012-03-11 MED ORDER — LORAZEPAM 1 MG PO TABS: 1.0000 mg | ORAL_TABLET | Freq: Three times a day (TID) | ORAL | Status: AC | PRN

## 2012-03-11 MED ORDER — LORAZEPAM 2 MG/ML IJ SOLN
1.0000 mg | Freq: Once | INTRAMUSCULAR | Status: AC
  Administered 2012-03-11: 1 mg via INTRAVENOUS
  Filled 2012-03-11: qty 1

## 2012-03-11 NOTE — ED Notes (Signed)
MD at bedside. 

## 2012-03-11 NOTE — ED Provider Notes (Addendum)
History   This chart was scribed for Charles B. Bernette Mayers, MD by Melba Coon. The patient was seen in room TR09C/TR09C and the patient's care was started at 9:10PM.    CSN: [id number]  Arrival date & time 03/11/12  2026   None     Chief Complaint  Patient presents with  . Emesis    (Consider location/radiation/quality/duration/timing/severity/associated sxs/prior treatment) The history is provided by the patient. No language interpreter was used.   MAGALI BRAY is a 66 y.o. female who presents to the Emergency Department complaining of persistent, moderate to severe anxiety with associated emesis with an onset today. Pt just found out that her son passed away today. Pt's daughter thought that she was going into shock with chills, slight, sharp, intermittent CP, trembling, and leg tingling; pt's PCP was called who told her to present to the ED. Pt has been vomiting all today as well. Pt took alprazolam 5 hrs ago which did not alleviate the symptoms. HA present from frequent crying today. No fever, neck pain, sore throat, rash, back pain, SOB, abd pain, diarrhea, dysuria, or extremity pain, edema, weakness, numbness, or tingling. No Hx of heart problems. No other pertinent medical symptoms.   Past Medical History  Diagnosis Date  . ANXIETY 08/14/2008  . INSOMNIA 09/07/2009  . Irritable bowel syndrome 09/07/2009  . MENOPAUSE, SURGICAL 08/14/2008  . UNSPECIFIED VAGINITIS AND VULVOVAGINITIS 06/04/2010    Past Surgical History  Procedure Date  . Abdominal hysterectomy   . Tonsillectomy   . Colectomy     partial  . Tonsillectomy     No family history on file.  History  Substance Use Topics  . Smoking status: Never Smoker   . Smokeless tobacco: Never Used  . Alcohol Use: Yes    OB History    Grav Para Term Preterm Abortions TAB SAB Ect Mult Living                  Review of Systems 10 Systems reviewed and all are negative for acute change except as noted in the  HPI.   Allergies  Prochlorperazine edisylate  Home Medications   Current Outpatient Rx  Name Route Sig Dispense Refill  . ALPRAZOLAM 0.25 MG PO TABS Oral Take 1 tablet (0.25 mg total) by mouth 3 (three) times daily as needed for sleep. 90 tablet 1    Needs to be seen for future refills  . ESTROGENS CONJUGATED 0.9 MG PO TABS Oral Take 0.9 mg by mouth daily. Take daily for 21 days then do not take for 7 days.    Marland Kitchen FLUOXETINE HCL 10 MG PO TABS  TAKE 1/2 TABLET BY MOUTH ONCE DAILY 90 tablet 3  . FLUTICASONE PROPIONATE 50 MCG/ACT NA SUSP  USE 2 SPRAYS IN EACH NOSTRIL ONCE DAILY 32 g 2  . METHOCARBAMOL 500 MG PO TABS  TAKE 1 TABLET EVERY DAY AS NEEDED 30 tablet 0  . TRAZODONE HCL 50 MG PO TABS Oral Take 25 mg by mouth at bedtime.      BP 130/73  Pulse 77  Temp 97 F (36.1 C) (Oral)  Resp 26  SpO2 100%  Physical Exam  Nursing note and vitals reviewed. Constitutional: She is oriented to person, place, and time. She appears well-developed and well-nourished.  HENT:  Head: Normocephalic and atraumatic.  Eyes: EOM are normal. Pupils are equal, round, and reactive to light.  Neck: Normal range of motion. Neck supple.  Cardiovascular: Normal rate, normal heart sounds and intact  distal pulses.   Pulmonary/Chest: Effort normal and breath sounds normal.  Abdominal: Bowel sounds are normal. She exhibits no distension. There is no tenderness.  Musculoskeletal: Normal range of motion. She exhibits no edema and no tenderness.  Neurological: She is alert and oriented to person, place, and time. She has normal strength. No cranial nerve deficit or sensory deficit.  Skin: Skin is warm and dry. No rash noted.  Psychiatric: She has a normal mood and affect.    ED Course  Procedures (including critical care time)  DIAGNOSTIC STUDIES: Oxygen Saturation is 100% on room air, normal by my interpretation.    COORDINATION OF CARE:  9:17PM - Toradol, ativan, CXR, blood w/u, and EKG will be ordered  for the pt. 9:19PM - pt will be moved to CDU.  Labs Reviewed - No data to display No results found.   No diagnosis found.    MDM  Likely grief reaction, will check basic labs and EKG to ensure no cardiac involvement in her reaction. Move to CDU. Discussed with Trixie Dredge, PA I personally performed the services described in the documentation, which were scribed in my presence. The recorded information has been reviewed and considered.     Date: 03/11/2012  Rate: 80  Rhythm: normal sinus rhythm  QRS Axis: normal  Intervals: normal  ST/T Wave abnormalities: normal  Conduction Disutrbances: none  Narrative Interpretation: unremarkable      Charles B. Bernette Mayers, MD 03/11/12 2240

## 2012-03-11 NOTE — ED Provider Notes (Signed)
11:15 PM Patient placed in CDU holding for labs, CXR, EKG, symptomatic treatment.  Sign out received from Dr Bernette Mayers.  Pt's son died today and she has had multiple symptoms thought to be associated with a grief reaction.  Workup has been unremarkable.  Pt reports she is feeling better after medications.  Pt d/c home with ativan (have advised pt to be careful as she has other sedating meds at home, have advised to stop xanax while taking ativan), and zofran as pt is concerned she may become nauseated again.  Discussed all results with patient.  Pt given return precautions.  Pt verbalizes understanding and agrees with plan.      Results for orders placed during the hospital encounter of 03/11/12  CBC WITH DIFFERENTIAL      Component Value Range   WBC 11.7 (*) 4.0 - 10.5 K/uL   RBC 4.79  3.87 - 5.11 MIL/uL   Hemoglobin 13.9  12.0 - 15.0 g/dL   HCT 16.1  09.6 - 04.5 %   MCV 83.5  78.0 - 100.0 fL   MCH 29.0  26.0 - 34.0 pg   MCHC 34.8  30.0 - 36.0 g/dL   RDW 40.9  81.1 - 91.4 %   Platelets 269  150 - 400 K/uL   Neutrophils Relative 88 (*) 43 - 77 %   Neutro Abs 10.3 (*) 1.7 - 7.7 K/uL   Lymphocytes Relative 9 (*) 12 - 46 %   Lymphs Abs 1.1  0.7 - 4.0 K/uL   Monocytes Relative 3  3 - 12 %   Monocytes Absolute 0.3  0.1 - 1.0 K/uL   Eosinophils Relative 0  0 - 5 %   Eosinophils Absolute 0.0  0.0 - 0.7 K/uL   Basophils Relative 0  0 - 1 %   Basophils Absolute 0.0  0.0 - 0.1 K/uL  BASIC METABOLIC PANEL      Component Value Range   Sodium 134 (*) 135 - 145 mEq/L   Potassium 3.5  3.5 - 5.1 mEq/L   Chloride 99  96 - 112 mEq/L   CO2 22  19 - 32 mEq/L   Glucose, Bld 145 (*) 70 - 99 mg/dL   BUN 15  6 - 23 mg/dL   Creatinine, Ser 7.82  0.50 - 1.10 mg/dL   Calcium 9.3  8.4 - 95.6 mg/dL   GFR calc non Af Amer >90  >90 mL/min   GFR calc Af Amer >90  >90 mL/min  TROPONIN I      Component Value Range   Troponin I <0.30  <0.30 ng/mL   Dg Chest 2 View  03/11/2012  *RADIOLOGY REPORT*  Clinical Data:  Chest pressure, shortness of breath, and dizziness and vomiting.  CHEST - 2 VIEW  Comparison: None.  Findings: The heart size and pulmonary vascularity are normal. The lungs appear clear and expanded without focal air space disease or consolidation. No blunting of the costophrenic angles.  No pneumothorax.  Mediastinal contours appear intact.  IMPRESSION: No evidence of active pulmonary disease.   Original Report Authenticated By: Marlon Pel, M.D.        [place], [place] 03/11/12 2318

## 2012-03-11 NOTE — ED Notes (Signed)
PT'S SON PASSED AWAY TODAY , PT. VOMITTED THIS EVENING , FEELING " COLD"  / " SHAKING" , FEET TINGLING , TOOK HER XANAX THIS EVENING WITH NO RELIEF, CHAPLAIN NOTIFIED .

## 2012-03-11 NOTE — ED Notes (Signed)
Pt transported to radiology, then taken to room 19. Family has been escorted to room 19.

## 2012-03-13 NOTE — ED Provider Notes (Signed)
Medical screening examination/treatment/procedure(s) were conducted as a shared visit with non-physician practitioner(s) and myself.  I personally evaluated the patient during the encounter   Elizabeth Sylvan B. Bernette Mayers, MD 03/13/12 [phone]

## 2012-04-09 ENCOUNTER — Telehealth: Payer: Self-pay | Admitting: Family Medicine

## 2012-04-09 ENCOUNTER — Other Ambulatory Visit: Payer: Self-pay | Admitting: Internal Medicine

## 2012-04-09 MED ORDER — ALPRAZOLAM 0.25 MG PO TABS: 0.2500 mg | ORAL_TABLET | Freq: Three times a day (TID) | ORAL | Status: DC | PRN

## 2012-04-09 NOTE — Telephone Encounter (Signed)
Please advise - pt taking tid  Last seen 09/2011 Last written 02/15/12 # 90 1RF

## 2012-04-09 NOTE — Telephone Encounter (Signed)
error 

## 2012-04-09 NOTE — Telephone Encounter (Signed)
Ok;  schedule CPX

## 2012-04-09 NOTE — Telephone Encounter (Signed)
Patient called stating that she need a refill of her aprazolam and wants the MD to know that she has been taking more of this due to the death of her son. [facility]. Patient is taking .25 mg tid. Please advise/assist. Patient only has eight left.

## 2012-04-09 NOTE — Telephone Encounter (Signed)
Spoke with husband - informed ok to Rf but will need to be seen for future refills

## 2012-05-07 ENCOUNTER — Other Ambulatory Visit (INDEPENDENT_AMBULATORY_CARE_PROVIDER_SITE_OTHER): Payer: 59

## 2012-05-07 DIAGNOSIS — Z0000 Encounter for general adult medical examination without abnormal findings: Secondary | ICD-10-CM

## 2012-05-07 LAB — CBC WITH DIFFERENTIAL/PLATELET
Basophils Absolute: 0 10*3/uL (ref 0.0–0.1)
Eosinophils Absolute: 0.1 10*3/uL (ref 0.0–0.7)
HCT: 40.7 % (ref 36.0–46.0)
Lymphs Abs: 1.6 10*3/uL (ref 0.7–4.0)
MCHC: 32.5 g/dL (ref 30.0–36.0)
MCV: 88.5 fl (ref 78.0–100.0)
Monocytes Absolute: 0.4 10*3/uL (ref 0.1–1.0)
Neutrophils Relative %: 56.3 % (ref 43.0–77.0)
Platelets: 252 10*3/uL (ref 150.0–400.0)
RDW: 13.6 % (ref 11.5–14.6)

## 2012-05-07 LAB — POCT URINALYSIS DIPSTICK
Bilirubin, UA: NEGATIVE
Glucose, UA: NEGATIVE
pH, UA: 5.5

## 2012-05-07 LAB — BASIC METABOLIC PANEL
BUN: 18 mg/dL (ref 6–23)
Chloride: 98 mEq/L (ref 96–112)
Creatinine, Ser: 0.8 mg/dL (ref 0.4–1.2)
Glucose, Bld: 93 mg/dL (ref 70–99)
Potassium: 4.5 mEq/L (ref 3.5–5.1)

## 2012-05-07 LAB — LIPID PANEL
LDL Cholesterol: 52 mg/dL (ref 0–99)
Total CHOL/HDL Ratio: 2
Triglycerides: 150 mg/dL — ABNORMAL HIGH (ref 0.0–149.0)
VLDL: 30 mg/dL (ref 0.0–40.0)

## 2012-05-07 LAB — HEPATIC FUNCTION PANEL
ALT: 12 U/L (ref 0–35)
Total Bilirubin: 0.7 mg/dL (ref 0.3–1.2)

## 2012-05-07 LAB — TSH: TSH: 1.91 u[IU]/mL (ref 0.35–5.50)

## 2012-05-09 ENCOUNTER — Encounter: Payer: Self-pay | Admitting: Internal Medicine

## 2012-05-09 ENCOUNTER — Ambulatory Visit (INDEPENDENT_AMBULATORY_CARE_PROVIDER_SITE_OTHER): Payer: 59 | Admitting: Internal Medicine

## 2012-05-09 ENCOUNTER — Telehealth: Payer: Self-pay | Admitting: Internal Medicine

## 2012-05-09 VITALS — BP 100/72 | HR 68 | Temp 98.1°F | Resp 20 | Ht 62.75 in | Wt 111.0 lb

## 2012-05-09 DIAGNOSIS — E8941 Symptomatic postprocedural ovarian failure: Secondary | ICD-10-CM

## 2012-05-09 DIAGNOSIS — Z0000 Encounter for general adult medical examination without abnormal findings: Secondary | ICD-10-CM

## 2012-05-09 DIAGNOSIS — F411 Generalized anxiety disorder: Secondary | ICD-10-CM

## 2012-05-09 DIAGNOSIS — K589 Irritable bowel syndrome without diarrhea: Secondary | ICD-10-CM

## 2012-05-09 DIAGNOSIS — Z23 Encounter for immunization: Secondary | ICD-10-CM

## 2012-05-09 MED ORDER — TRAZODONE HCL 50 MG PO TABS: 25.0000 mg | ORAL_TABLET | Freq: Every day | ORAL | Status: DC

## 2012-05-09 MED ORDER — METHOCARBAMOL 500 MG PO TABS: 500.0000 mg | ORAL_TABLET | Freq: Three times a day (TID) | ORAL | Status: DC

## 2012-05-09 MED ORDER — ESTROGENS CONJUGATED 0.9 MG PO TABS: 0.9000 mg | ORAL_TABLET | Freq: Every day | ORAL | Status: DC

## 2012-05-09 MED ORDER — ALPRAZOLAM 0.25 MG PO TABS: 0.2500 mg | ORAL_TABLET | Freq: Three times a day (TID) | ORAL | Status: DC | PRN

## 2012-05-09 MED ORDER — FLUOXETINE HCL 10 MG PO TABS: 10.0000 mg | ORAL_TABLET | Freq: Every day | ORAL | Status: DC

## 2012-05-09 MED ORDER — FLUTICASONE PROPIONATE 50 MCG/ACT NA SUSP: 2.0000 | Freq: Every day | NASAL | Status: DC

## 2012-05-09 NOTE — Patient Instructions (Signed)
Take a calcium supplement, plus [phone] units of vitamin D    It is important that you exercise regularly, at least 20 minutes 3 to 4 times per week.  If you develop chest pain or shortness of breath seek  medical attention.  Schedule your colonoscopy to help detect colon cancer.  Return in 6 months for follow-up

## 2012-05-09 NOTE — Telephone Encounter (Signed)
[facility]

## 2012-05-09 NOTE — Progress Notes (Signed)
Subjective:    Patient ID: Elizabeth Ward, female    DOB: 01-19-1946, 66 y.o.   MRN: [id number]  HPI Chief Complaint: To Establish. Annual exam  History of Present Illness:   66 year-old patient is seen today for an annual exam.  Medical problems include anxiety IBS insomnia. She is also had a surgical menopause in 1992 did 2 endometriosis. She has been on hormone replacement therapy. More recently she has been adjusting to the death of her son who died in the Apr 05, 2023 of this year.  She has chronic intermittent neck pain well-controlled with muscle relaxers  she has been on hormone replacement therapy since her surgical menopause   Current Allergies:  ! COMPAZINE  Past Medical History:  Reviewed history and no changes required:  surgical menopause  history of endometriosis  Anxiety  insomnia  neck pain  IBS   Past Surgical History:  Reviewed history and no changes required:  Colectomy partial 1992 for endometrioma  Hysterectomy history of endometriosis 1983  Tonsillectomy 1954  Colonoscopy in 1995   Family History:  Reviewed history and no changes required:  father died age 52, lung cancer  mother died age 32 complications of Hodgkin's disease  No siblings   Social History:  Reviewed history and no changes required:  Married  Never Smoked  one son  died April 04, 2012 Risk Factors:  Tobacco use: never     Review of Systems  Constitutional: Negative for fever, appetite change, fatigue and unexpected weight change.  HENT: Negative for hearing loss, ear pain, nosebleeds, congestion, sore throat, mouth sores, trouble swallowing, neck stiffness, dental problem, voice change, sinus pressure and tinnitus.   Eyes: Negative for photophobia, pain, redness and visual disturbance.  Respiratory: Negative for cough, chest tightness and shortness of breath.   Cardiovascular: Negative for chest pain, palpitations and leg swelling.  Gastrointestinal: Negative for nausea, vomiting,  abdominal pain, diarrhea, constipation, blood in stool, abdominal distention and rectal pain.  Genitourinary: Negative for dysuria, urgency, frequency, hematuria, flank pain, vaginal bleeding, vaginal discharge, difficulty urinating, genital sores, vaginal pain, menstrual problem and pelvic pain.  Musculoskeletal: Positive for arthralgias. Negative for back pain.  Skin: Negative for rash.  Neurological: Negative for dizziness, syncope, speech difficulty, weakness, light-headedness, numbness and headaches.  Hematological: Negative for adenopathy. Does not bruise/bleed easily.  Psychiatric/Behavioral: Positive for disturbed wake/sleep cycle. Negative for suicidal ideas, behavioral problems, self-injury, dysphoric mood and agitation. The patient is nervous/anxious.        Objective:   Physical Exam  Constitutional: She is oriented to person, place, and time. She appears well-developed and well-nourished.  HENT:  Head: Normocephalic and atraumatic.  Right Ear: External ear normal.  Left Ear: External ear normal.  Mouth/Throat: Oropharynx is clear and moist.  Eyes: Conjunctivae normal and EOM are normal.  Neck: Normal range of motion. Neck supple. No JVD present. No thyromegaly present.  Cardiovascular: Normal rate, regular rhythm, normal heart sounds and intact distal pulses.   No murmur heard. Pulmonary/Chest: Effort normal and breath sounds normal. She has no wheezes. She has no rales.  Abdominal: Soft. Bowel sounds are normal. She exhibits no distension and no mass. There is no tenderness. There is no rebound and no guarding.  Musculoskeletal: Normal range of motion. She exhibits no edema and no tenderness.  Neurological: She is alert and oriented to person, place, and time. She has normal reflexes. No cranial nerve deficit. She exhibits normal muscle tone. Coordination normal.  Skin: Skin is warm and dry.  No rash noted.  Psychiatric: She has a normal mood and affect. Her behavior is  normal.          Assessment & Plan:    preventive health examination. Laboratory studies reviewed and discussed with patient screening colonoscopy also encouraged. She will consider this after the first of the year  Anxiety disorder. Alprazolam refilled   Recheck 6 months

## 2012-05-09 NOTE — Telephone Encounter (Signed)
Pt came by and said that the pharmacy has not rcvd the script for ALPRAZolam (XANAX) 0.25 MG tablet. Pls call in directly to the pharmacist. Pt req to pick up today.

## 2012-07-05 ENCOUNTER — Other Ambulatory Visit: Payer: Self-pay | Admitting: Internal Medicine

## 2012-08-30 ENCOUNTER — Other Ambulatory Visit: Payer: Self-pay | Admitting: Internal Medicine

## 2012-10-09 DIAGNOSIS — Z85828 Personal history of other malignant neoplasm of skin: Secondary | ICD-10-CM | POA: Diagnosis not present

## 2012-10-09 DIAGNOSIS — L820 Inflamed seborrheic keratosis: Secondary | ICD-10-CM | POA: Diagnosis not present

## 2012-10-09 DIAGNOSIS — L821 Other seborrheic keratosis: Secondary | ICD-10-CM | POA: Diagnosis not present

## 2012-10-09 DIAGNOSIS — L259 Unspecified contact dermatitis, unspecified cause: Secondary | ICD-10-CM | POA: Diagnosis not present

## 2012-10-28 ENCOUNTER — Other Ambulatory Visit: Payer: Self-pay | Admitting: Internal Medicine

## 2012-10-30 NOTE — Telephone Encounter (Signed)
Pt is out of alprazolam °

## 2013-01-23 ENCOUNTER — Other Ambulatory Visit: Payer: Self-pay | Admitting: Internal Medicine

## 2013-01-23 NOTE — Telephone Encounter (Signed)
PT called and stated that she will run out before the end of the weekend. Please assist.

## 2013-02-26 ENCOUNTER — Other Ambulatory Visit: Payer: Self-pay

## 2013-03-06 ENCOUNTER — Other Ambulatory Visit: Payer: Self-pay | Admitting: Internal Medicine

## 2013-04-17 ENCOUNTER — Telehealth: Payer: Self-pay | Admitting: Internal Medicine

## 2013-04-17 ENCOUNTER — Other Ambulatory Visit: Payer: Self-pay | Admitting: Internal Medicine

## 2013-04-17 MED ORDER — ALPRAZOLAM 0.25 MG PO TABS: ORAL_TABLET | ORAL | Status: DC

## 2013-04-17 NOTE — Telephone Encounter (Signed)
Left message Rx called into pharmacy as requested.

## 2013-04-17 NOTE — Telephone Encounter (Signed)
Pt request refill ALPRAZolam (XANAX) 0.25 MG tablet Pt has made her yearly cpe for 10/31.  (3 / day) (30 day )Can you refill now?

## 2013-04-22 ENCOUNTER — Other Ambulatory Visit: Payer: Self-pay | Admitting: Internal Medicine

## 2013-05-15 ENCOUNTER — Other Ambulatory Visit (INDEPENDENT_AMBULATORY_CARE_PROVIDER_SITE_OTHER): Payer: Medicare Other

## 2013-05-15 DIAGNOSIS — Z0000 Encounter for general adult medical examination without abnormal findings: Secondary | ICD-10-CM

## 2013-05-15 DIAGNOSIS — Z79899 Other long term (current) drug therapy: Secondary | ICD-10-CM | POA: Diagnosis not present

## 2013-05-15 LAB — BASIC METABOLIC PANEL
Calcium: 9.3 mg/dL (ref 8.4–10.5)
Creatinine, Ser: 0.9 mg/dL (ref 0.4–1.2)
GFR: 70.21 mL/min (ref >60.00)
Glucose, Bld: 96 mg/dL (ref 70–99)
Sodium: 138 mEq/L (ref 135–145)

## 2013-05-15 LAB — LIPID PANEL
Cholesterol: 172 mg/dL (ref 0–200)
HDL: 96.1 mg/dL (ref >39.00)
LDL Cholesterol: 55 mg/dL (ref 0–99)
Total CHOL/HDL Ratio: 2
Triglycerides: 107 mg/dL (ref 0.0–149.0)

## 2013-05-15 LAB — CBC WITH DIFFERENTIAL/PLATELET
Basophils Absolute: 0 10*3/uL (ref 0.0–0.1)
Eosinophils Absolute: 0.1 10*3/uL (ref 0.0–0.7)
Hemoglobin: 13.6 g/dL (ref 12.0–15.0)
Lymphocytes Relative: 30.4 % (ref 12.0–46.0)
MCHC: 34.3 g/dL (ref 30.0–36.0)
Monocytes Relative: 8.1 % (ref 3.0–12.0)
Neutro Abs: 3.2 10*3/uL (ref 1.4–7.7)
Neutrophils Relative %: 58.5 % (ref 43.0–77.0)
Platelets: 270 10*3/uL (ref 150.0–400.0)
RDW: 13.5 % (ref 11.5–14.6)

## 2013-05-15 LAB — HEPATIC FUNCTION PANEL
AST: 17 U/L (ref 0–37)
Alkaline Phosphatase: 40 U/L (ref 39–117)
Bilirubin, Direct: 0 mg/dL (ref 0.0–0.3)
Total Bilirubin: 0.4 mg/dL (ref 0.3–1.2)

## 2013-05-15 LAB — POCT URINALYSIS DIPSTICK
Bilirubin, UA: NEGATIVE
Blood, UA: NEGATIVE
Glucose, UA: NEGATIVE
Spec Grav, UA: 1.025
Urobilinogen, UA: 0.2

## 2013-05-21 ENCOUNTER — Telehealth: Payer: Self-pay | Admitting: Internal Medicine

## 2013-05-21 MED ORDER — ALPRAZOLAM 0.25 MG PO TABS: ORAL_TABLET | ORAL | Status: DC

## 2013-05-21 NOTE — Telephone Encounter (Signed)
Pt has appt 10/31, but request refill ALPRAZolam (XANAX) 0.25 MG tablet prior to appt. Afraid she will run out. [place] Hun Iva Lento

## 2013-05-21 NOTE — Telephone Encounter (Signed)
Pt notified Rx called into pharmacy 

## 2013-05-23 ENCOUNTER — Encounter: Payer: Self-pay | Admitting: Internal Medicine

## 2013-05-23 ENCOUNTER — Ambulatory Visit (INDEPENDENT_AMBULATORY_CARE_PROVIDER_SITE_OTHER): Payer: Medicare Other | Admitting: Internal Medicine

## 2013-05-23 VITALS — BP 120/70 | Temp 98.1°F | Ht 63.0 in | Wt 110.0 lb

## 2013-05-23 DIAGNOSIS — Z0000 Encounter for general adult medical examination without abnormal findings: Secondary | ICD-10-CM

## 2013-05-23 DIAGNOSIS — E8941 Symptomatic postprocedural ovarian failure: Secondary | ICD-10-CM | POA: Diagnosis not present

## 2013-05-23 DIAGNOSIS — F411 Generalized anxiety disorder: Secondary | ICD-10-CM | POA: Diagnosis not present

## 2013-05-23 DIAGNOSIS — G4700 Insomnia, unspecified: Secondary | ICD-10-CM | POA: Diagnosis not present

## 2013-05-23 DIAGNOSIS — Z23 Encounter for immunization: Secondary | ICD-10-CM | POA: Diagnosis not present

## 2013-05-23 MED ORDER — ALPRAZOLAM 0.25 MG PO TABS: ORAL_TABLET | ORAL | Status: DC

## 2013-05-23 MED ORDER — FLUOXETINE HCL 20 MG PO TABS: 20.0000 mg | ORAL_TABLET | Freq: Every day | ORAL | Status: DC

## 2013-05-23 NOTE — Progress Notes (Signed)
Patient ID: Elizabeth Ward, female   DOB: 1946/01/10, 67 y.o.   MRN: [id number]  Subjective:    Patient ID: Elizabeth Ward, female    DOB: 22-Jun-1946, 67 y.o.   MRN: [id number]  HPI Chief Complaint: To Establish. Annual exam  History of Present Illness:   67  year-old patient is seen today for an annual exam.  Medical problems include anxiety IBS insomnia. She is also had a surgical menopause in 1992 due to endometriosis. She has been on hormone replacement therapy. More recently she has been adjusting to the death of her son who died in the Mar 21, 2012.  She has chronic intermittent neck pain well-controlled with muscle relaxers  she has been on hormone replacement therapy since her surgical menopause   Current Allergies:  ! COMPAZINE   Past Medical History:   surgical menopause  history of endometriosis  Anxiety  insomnia  neck pain  IBS   Past Surgical History:   Colectomy partial 1992 for endometrioma  Hysterectomy history of endometriosis 1983  Tonsillectomy 1954  Colonoscopy in 1995   Family History:   father died age 75, lung cancer  mother died age 60 complications of Hodgkin's disease  No siblings   Social History:  Reviewed history and no changes required:  Married  Never Smoked  one son  died March 21, 2012 Risk Factors:  Tobacco use: never     Review of Systems  Constitutional: Negative for fever, appetite change, fatigue and unexpected weight change.  HENT: Negative for congestion, dental problem, ear pain, hearing loss, mouth sores, nosebleeds, sinus pressure, sore throat, tinnitus, trouble swallowing and voice change.   Eyes: Negative for photophobia, pain, redness and visual disturbance.  Respiratory: Negative for cough, chest tightness and shortness of breath.   Cardiovascular: Negative for chest pain, palpitations and leg swelling.  Gastrointestinal: Negative for nausea, vomiting, abdominal pain, diarrhea, constipation, blood in stool, abdominal  distention and rectal pain.  Genitourinary: Negative for dysuria, urgency, frequency, hematuria, flank pain, vaginal bleeding, vaginal discharge, difficulty urinating, genital sores, vaginal pain, menstrual problem and pelvic pain.  Musculoskeletal: Positive for arthralgias. Negative for back pain and neck stiffness.  Skin: Negative for rash.  Neurological: Negative for dizziness, syncope, speech difficulty, weakness, light-headedness, numbness and headaches.  Hematological: Negative for adenopathy. Does not bruise/bleed easily.  Psychiatric/Behavioral: Positive for sleep disturbance, dysphoric mood and decreased concentration. Negative for suicidal ideas, behavioral problems, self-injury and agitation. The patient is nervous/anxious.        Objective:   Physical Exam  Constitutional: She is oriented to person, place, and time. She appears well-developed and well-nourished.  HENT:  Head: Normocephalic and atraumatic.  Right Ear: External ear normal.  Left Ear: External ear normal.  Mouth/Throat: Oropharynx is clear and moist.  Eyes: Conjunctivae and EOM are normal.  Neck: Normal range of motion. Neck supple. No JVD present. No thyromegaly present.  Cardiovascular: Normal rate, regular rhythm, normal heart sounds and intact distal pulses.   No murmur heard. Pulmonary/Chest: Effort normal and breath sounds normal. She has no wheezes. She has no rales.  Abdominal: Soft. Bowel sounds are normal. She exhibits no distension and no mass. There is no tenderness. There is no rebound and no guarding.  Musculoskeletal: Normal range of motion. She exhibits no edema and no tenderness.  Neurological: She is alert and oriented to person, place, and time. She has normal reflexes. No cranial nerve deficit. She exhibits normal muscle tone. Coordination normal.  Skin: Skin is warm  and dry. No rash noted.  Psychiatric: She has a normal mood and affect. Her behavior is normal.   1. Risk factors, based on  past  M,S,F history- no significant cardiovascular risk factors  2.  Physical activities: No regimented exercise program but remains active. At ideal body weight  3.  Depression/mood: History of reactive depression since the death of her son in March 20, 2023 of last year  4.  Hearing: No deficits  5.  ADL's: Independent in aspects of daily living  6.  Fall risk: Low  7.  Home safety: No problems identified  8.  Height weight, and visual acuity; height and weight stable no change in visual acuity  9.  Counseling: Behavioral health counseling recommended. Regular exercise regimen encouraged  10. Lab orders based on risk factors: Laboratory profile reviewed  11. Referral : To receive counseling at [facility]. Followup colonoscopy encouraged as well as mammogram  12. Care plan: As above  13. Cognitive assessment: Alert and oriented normal affect no cognitive dysfunction          Assessment & Plan:   Preventive health examination. Laboratory studies reviewed and discussed with patient screening colonoscopy also encouraged. She will consider this after the first of the year  Anxiety disorder. Alprazolam refilled counseling recommended. She will consider getting help at her church Will increase Prozac to 20 mg daily  Recheck 6 months

## 2013-05-23 NOTE — Patient Instructions (Signed)
It is important that you exercise regularly, at least 20 minutes 3 to 4 times per week.  If you develop chest pain or shortness of breath seek  medical attention.  Schedule your mammogram.  Schedule your colonoscopy to help detect colon cancer.

## 2013-06-11 ENCOUNTER — Other Ambulatory Visit: Payer: Self-pay | Admitting: Internal Medicine

## 2013-07-07 ENCOUNTER — Other Ambulatory Visit: Payer: Self-pay | Admitting: Internal Medicine

## 2013-07-11 ENCOUNTER — Other Ambulatory Visit: Payer: Self-pay | Admitting: Internal Medicine

## 2013-07-11 NOTE — Telephone Encounter (Signed)
Dr.K, pt would like to increase Alprazolam 0.25 mg to 4 times a day instead of 3? Please advise.

## 2013-07-11 NOTE — Telephone Encounter (Signed)
Spoke to pt told her okay Dr. Kirtland Bouchard is out until Monday will send message to him and get back to her. Pt verbalized understanding.

## 2013-07-11 NOTE — Telephone Encounter (Signed)
Pt would like to increase her alprazolam to 4 pills a day instead for 3 per day. [facility]

## 2013-07-13 NOTE — Telephone Encounter (Signed)
ok 

## 2013-07-14 MED ORDER — ALPRAZOLAM 0.25 MG PO TABS: 0.2500 mg | ORAL_TABLET | Freq: Four times a day (QID) | ORAL | Status: DC | PRN

## 2013-07-14 NOTE — Telephone Encounter (Signed)
Rx called into pharmacy.  Left message for patient

## 2013-09-11 ENCOUNTER — Other Ambulatory Visit: Payer: Self-pay | Admitting: Internal Medicine

## 2013-10-06 ENCOUNTER — Telehealth: Payer: Self-pay | Admitting: Internal Medicine

## 2013-10-06 ENCOUNTER — Emergency Department (HOSPITAL_COMMUNITY): Payer: Medicare Other

## 2013-10-06 ENCOUNTER — Emergency Department (HOSPITAL_COMMUNITY)
Admission: EM | Admit: 2013-10-06 | Discharge: 2013-10-06 | Disposition: A | Discharge status: Home / Self Care | Payer: Medicare Other | Attending: Emergency Medicine | Admitting: Emergency Medicine

## 2013-10-06 ENCOUNTER — Encounter (HOSPITAL_COMMUNITY): Payer: Self-pay | Admitting: Emergency Medicine

## 2013-10-06 DIAGNOSIS — R112 Nausea with vomiting, unspecified: Secondary | ICD-10-CM | POA: Insufficient documentation

## 2013-10-06 DIAGNOSIS — Z8719 Personal history of other diseases of the digestive system: Secondary | ICD-10-CM | POA: Diagnosis not present

## 2013-10-06 DIAGNOSIS — F411 Generalized anxiety disorder: Secondary | ICD-10-CM | POA: Diagnosis not present

## 2013-10-06 DIAGNOSIS — R5381 Other malaise: Secondary | ICD-10-CM | POA: Diagnosis not present

## 2013-10-06 DIAGNOSIS — R42 Dizziness and giddiness: Secondary | ICD-10-CM | POA: Insufficient documentation

## 2013-10-06 DIAGNOSIS — Z8742 Personal history of other diseases of the female genital tract: Secondary | ICD-10-CM | POA: Diagnosis not present

## 2013-10-06 DIAGNOSIS — Z79899 Other long term (current) drug therapy: Secondary | ICD-10-CM | POA: Diagnosis not present

## 2013-10-06 DIAGNOSIS — R1013 Epigastric pain: Secondary | ICD-10-CM | POA: Diagnosis not present

## 2013-10-06 DIAGNOSIS — Z90710 Acquired absence of both cervix and uterus: Secondary | ICD-10-CM | POA: Diagnosis not present

## 2013-10-06 DIAGNOSIS — R5383 Other fatigue: Secondary | ICD-10-CM

## 2013-10-06 DIAGNOSIS — R197 Diarrhea, unspecified: Secondary | ICD-10-CM | POA: Diagnosis not present

## 2013-10-06 LAB — COMPREHENSIVE METABOLIC PANEL
Alkaline Phosphatase: 49 U/L (ref 39–117)
CO2: 23 mEq/L (ref 19–32)
Chloride: 97 mEq/L (ref 96–112)
Creatinine, Ser: 0.81 mg/dL (ref 0.50–1.10)
GFR calc Af Amer: 86 mL/min — ABNORMAL LOW (ref >90)
Sodium: 136 mEq/L — ABNORMAL LOW (ref 137–147)
Total Protein: 8 g/dL (ref 6.0–8.3)

## 2013-10-06 LAB — URINALYSIS, ROUTINE W REFLEX MICROSCOPIC
Glucose, UA: NEGATIVE mg/dL
Hgb urine dipstick: NEGATIVE
KETONES UR: 40 mg/dL — AB
LEUKOCYTES UA: NEGATIVE
Nitrite: NEGATIVE
PROTEIN: NEGATIVE mg/dL
Specific Gravity, Urine: 1.026 (ref 1.005–1.030)
Urobilinogen, UA: 0.2 mg/dL (ref 0.0–1.0)
pH: 5.5 (ref 5.0–8.0)

## 2013-10-06 LAB — I-STAT TROPONIN, ED: Troponin i, poc: 0 ng/mL (ref 0.00–0.08)

## 2013-10-06 LAB — COMPREHENSIVE METABOLIC PANEL WITH GFR
ALT: 15 U/L (ref 0–35)
AST: 14 U/L (ref 0–37)
Albumin: 4 g/dL (ref 3.5–5.2)
BUN: 21 mg/dL (ref 6–23)
Calcium: 9.8 mg/dL (ref 8.4–10.5)
GFR calc non Af Amer: 74 mL/min — ABNORMAL LOW (ref >90)
Glucose, Bld: 147 mg/dL — ABNORMAL HIGH (ref 70–99)
Potassium: 4.1 meq/L (ref 3.7–5.3)
Total Bilirubin: 1.1 mg/dL (ref 0.3–1.2)

## 2013-10-06 LAB — CBC WITH DIFFERENTIAL/PLATELET
Basophils Absolute: 0 10*3/uL (ref 0.0–0.1)
Basophils Relative: 0 % (ref 0–1)
Eosinophils Absolute: 0 K/uL (ref 0.0–0.7)
Eosinophils Relative: 0 % (ref 0–5)
HCT: 43.7 % (ref 36.0–46.0)
Hemoglobin: 15.1 g/dL — ABNORMAL HIGH (ref 12.0–15.0)
Lymphocytes Relative: 2 % — ABNORMAL LOW (ref 12–46)
Lymphs Abs: 0.2 10*3/uL — ABNORMAL LOW (ref 0.7–4.0)
MCH: 29.3 pg (ref 26.0–34.0)
MCHC: 34.6 g/dL (ref 30.0–36.0)
MCV: 84.7 fL (ref 78.0–100.0)
Monocytes Absolute: 0.3 K/uL (ref 0.1–1.0)
Monocytes Relative: 4 % (ref 3–12)
Neutro Abs: 7.7 10*3/uL (ref 1.7–7.7)
Neutrophils Relative %: 94 % — ABNORMAL HIGH (ref 43–77)
Platelets: 259 10*3/uL (ref 150–400)
RBC: 5.16 MIL/uL — ABNORMAL HIGH (ref 3.87–5.11)
RDW: 13.5 % (ref 11.5–15.5)
WBC: 8.2 10*3/uL (ref 4.0–10.5)

## 2013-10-06 LAB — LIPASE, BLOOD: Lipase: 36 U/L (ref 11–59)

## 2013-10-06 MED ORDER — SODIUM CHLORIDE 0.9 % IV BOLUS (SEPSIS)
1000.0000 mL | Freq: Once | INTRAVENOUS | Status: AC
  Administered 2013-10-06: 1000 mL via INTRAVENOUS

## 2013-10-06 MED ORDER — ONDANSETRON 8 MG PO TBDP: 8.0000 mg | ORAL_TABLET | Freq: Three times a day (TID) | ORAL | Status: DC | PRN

## 2013-10-06 MED ORDER — ONDANSETRON HCL 4 MG/2ML IJ SOLN
4.0000 mg | Freq: Once | INTRAMUSCULAR | Status: AC
  Administered 2013-10-06: 4 mg via INTRAVENOUS
  Filled 2013-10-06: qty 2

## 2013-10-06 NOTE — ED Provider Notes (Signed)
Medical screening examination/treatment/procedure(s) were conducted as a shared visit with non-physician practitioner(s) and myself.  I personally evaluated the patient during the encounter.   EKG Interpretation None      Pt with N/V/D that began acutely at 0200, numerous times, clinically dehydrated. Abd soft except for some upper epigastric pain that began after emesis several times.  A grandson with similar symptoms that began yesterday as well.  Pt feels improved after IV fluids and meds.  Stable for discharge as she is able to keep down PO's.  Diarrhea has slowed down as well.     Saddie Benders. Dorna Mai, MD 10/06/13 [id number]

## 2013-10-06 NOTE — ED Notes (Signed)
Pt states she has had n/v/d since 0200 today along with epigastric pain. Pt states she has thrown up 13x and diarrhea 13x as well. Pt states she has been around others with similar symptoms.

## 2013-10-06 NOTE — Discharge Instructions (Signed)
Continue fluids tonight. Take zofran as prescribed as needed for nausea. You can take immodium as needed for diarrhea. Follow up with your primary care doctor. Advance diet tomorrow as tolerate. If worsening symptoms or increased abdominal pain, return to ER.    Viral Gastroenteritis Viral gastroenteritis is also known as stomach flu. This condition affects the stomach and intestinal tract. It can cause sudden diarrhea and vomiting. The illness typically lasts 3 to 8 days. Most people develop an immune response that eventually gets rid of the virus. While this natural response develops, the virus can make you quite ill. CAUSES  Many different viruses can cause gastroenteritis, such as rotavirus or noroviruses. You can catch one of these viruses by consuming contaminated food or water. You may also catch a virus by sharing utensils or other personal items with an infected person or by touching a contaminated surface. SYMPTOMS  The most common symptoms are diarrhea and vomiting. These problems can cause a severe loss of body fluids (dehydration) and a body salt (electrolyte) imbalance. Other symptoms may include:  Fever.  Headache.  Fatigue.  Abdominal pain. DIAGNOSIS  Your caregiver can usually diagnose viral gastroenteritis based on your symptoms and a physical exam. A stool sample may also be taken to test for the presence of viruses or other infections. TREATMENT  This illness typically goes away on its own. Treatments are aimed at rehydration. The most serious cases of viral gastroenteritis involve vomiting so severely that you are not able to keep fluids down. In these cases, fluids must be given through an intravenous line (IV). HOME CARE INSTRUCTIONS   Drink enough fluids to keep your urine clear or pale yellow. Drink small amounts of fluids frequently and increase the amounts as tolerated.  Ask your caregiver for specific rehydration instructions.  Avoid:  Foods high in  sugar.  Alcohol.  Carbonated drinks.  Tobacco.  Juice.  Caffeine drinks.  Extremely hot or cold fluids.  Fatty, greasy foods.  Too much intake of anything at one time.  Dairy products until 24 to 48 hours after diarrhea stops.  You may consume probiotics. Probiotics are active cultures of beneficial bacteria. They may lessen the amount and number of diarrheal stools in adults. Probiotics can be found in yogurt with active cultures and in supplements.  Wash your hands well to avoid spreading the virus.  Only take over-the-counter or prescription medicines for pain, discomfort, or fever as directed by your caregiver. Do not give aspirin to children. Antidiarrheal medicines are not recommended.  Ask your caregiver if you should continue to take your regular prescribed and over-the-counter medicines.  Keep all follow-up appointments as directed by your caregiver. SEEK IMMEDIATE MEDICAL CARE IF:   You are unable to keep fluids down.  You do not urinate at least once every 6 to 8 hours.  You develop shortness of breath.  You notice blood in your stool or vomit. This may look like coffee grounds.  You have abdominal pain that increases or is concentrated in one small area (localized).  You have persistent vomiting or diarrhea.  You have a fever.  The patient is a child younger than 3 months, and he or she has a fever.  The patient is a child older than 3 months, and he or she has a fever and persistent symptoms.  The patient is a child older than 3 months, and he or she has a fever and symptoms suddenly get worse.  The patient is a baby, and he  or she has no tears when crying. MAKE SURE YOU:   Understand these instructions.  Will watch your condition.  Will get help right away if you are not doing well or get worse. Document Released: 07/10/2005 Document Revised: 10/02/2011 Document Reviewed: 04/26/2011 [facility] Patient Information 2014 [place], [place]. Diet for  Diarrhea, Adult Frequent, runny stools (diarrhea) may be caused or worsened by food or drink. Diarrhea may be relieved by changing your diet. Since diarrhea can last up to 7 days, it is easy for you to lose too much fluid from the body and become dehydrated. Fluids that are lost need to be replaced. Along with a modified diet, make sure you drink enough fluids to keep your urine clear or pale yellow. DIET INSTRUCTIONS  Ensure adequate fluid intake (hydration): have 1 cup (8 oz) of fluid for each diarrhea episode. Avoid fluids that contain simple sugars or sports drinks, fruit juices, whole milk products, and sodas. Your urine should be clear or pale yellow if you are drinking enough fluids. Hydrate with an oral rehydration solution that you can purchase at pharmacies, retail stores, and online. You can prepare an oral rehydration solution at home by mixing the following ingredients together:    tsp table salt.   tsp baking soda.   tsp salt substitute containing potassium chloride.  1  tablespoons sugar.  1 L (34 oz) of water.  Certain foods and beverages may increase the speed at which food moves through the gastrointestinal (GI) tract. These foods and beverages should be avoided and include:  Caffeinated and alcoholic beverages.  High-fiber foods, such as raw fruits and vegetables, nuts, seeds, and whole grain breads and cereals.  Foods and beverages sweetened with sugar alcohols, such as xylitol, sorbitol, and mannitol.  Some foods may be well tolerated and may help thicken stool including:  Starchy foods, such as rice, toast, pasta, low-sugar cereal, oatmeal, grits, baked potatoes, crackers, and bagels.   Bananas.   Applesauce.  Add probiotic-rich foods to help increase healthy bacteria in the GI tract, such as yogurt and fermented milk products. RECOMMENDED FOODS AND BEVERAGES Starches Choose foods with less than 2 g of fiber per serving.  Recommended:  White, [place], and  pita breads, plain rolls, buns, bagels. Plain muffins, matzo. Soda, saltine, or graham crackers. Pretzels, melba toast, zwieback. Cooked cereals made with water: cornmeal, farina, cream cereals. Dry cereals: refined corn, wheat, rice. Potatoes prepared any way without skins, refined macaroni, spaghetti, noodles, refined rice.  Avoid:  Bread, rolls, or crackers made with whole wheat, multi-grains, rye, bran seeds, nuts, or coconut. Corn tortillas or taco shells. Cereals containing whole grains, multi-grains, bran, coconut, nuts, raisins. Cooked or dry oatmeal. Coarse wheat cereals, granola. Cereals advertised as "high-fiber." Potato skins. Whole grain pasta, wild or brown rice. Popcorn. Sweet potatoes, yams. Sweet rolls, doughnuts, waffles, pancakes, sweet breads. Vegetables  Recommended: Strained tomato and vegetable juices. Most well-cooked and canned vegetables without seeds. Fresh: Tender lettuce, cucumber without the skin, cabbage, spinach, bean sprouts.  Avoid: Fresh, cooked, or canned: Artichokes, baked beans, beet greens, broccoli, Brussels sprouts, corn, kale, legumes, peas, sweet potatoes. Cooked: Green or red cabbage, spinach. Avoid large servings of any vegetables because vegetables shrink when cooked, and they contain more fiber per serving than fresh vegetables. Fruit  Recommended: Cooked or canned: Apricots, applesauce, cantaloupe, cherries, fruit cocktail, grapefruit, grapes, kiwi, mandarin oranges, peaches, pears, plums, watermelon. Fresh: Apples without skin, ripe banana, grapes, cantaloupe, cherries, grapefruit, peaches, oranges, plums. Keep servings limited to  cup or 1 piece.  Avoid: Fresh: Apples with skin, apricots, mangoes, pears, raspberries, strawberries. Prune juice, stewed or dried prunes. Dried fruits, raisins, dates. Large servings of all fresh fruits. Protein  Recommended: Ground or well-cooked tender beef, ham, veal, lamb, pork, or poultry. Eggs. Fish, oysters, shrimp,  lobster, other seafoods. Liver, organ meats.  Avoid: Tough, fibrous meats with gristle. Peanut butter, smooth or chunky. Cheese, nuts, seeds, legumes, dried peas, beans, lentils. Dairy  Recommended: Yogurt, lactose-free milk, kefir, drinkable yogurt, buttermilk, soy milk, or plain hard cheese.  Avoid: Milk, chocolate milk, beverages made with milk, such as milkshakes. Soups  Recommended: Bouillon, broth, or soups made from allowed foods. Any strained soup.  Avoid: Soups made from vegetables that are not allowed, cream or milk-based soups. Desserts and Sweets  Recommended: Sugar-free gelatin, sugar-free frozen ice pops made without sugar alcohol.  Avoid: Plain cakes and cookies, pie made with fruit, pudding, custard, cream pie. Gelatin, fruit, ice, sherbet, frozen ice pops. Ice cream, ice milk without nuts. Plain hard candy, honey, jelly, molasses, syrup, sugar, chocolate syrup, gumdrops, marshmallows. Fats and Oils  Recommended: Limit fats to less than 8 tsp per day.  Avoid: Seeds, nuts, olives, avocados. Margarine, butter, cream, mayonnaise, salad oils, plain salad dressings. Plain gravy, crisp bacon without rind. Beverages  Recommended: Water, decaffeinated teas, oral rehydration solutions, sugar-free beverages not sweetened with sugar alcohols.  Avoid: Fruit juices, caffeinated beverages (coffee, tea, soda), alcohol, sports drinks, or lemon-lime soda. Condiments  Recommended: Ketchup, mustard, horseradish, vinegar, cocoa powder. Spices in moderation: allspice, basil, bay leaves, celery powder or leaves, cinnamon, cumin powder, curry powder, ginger, mace, marjoram, onion or garlic powder, oregano, paprika, parsley flakes, ground pepper, rosemary, sage, savory, tarragon, thyme, turmeric.  Avoid: Coconut, honey. Document Released: 09/30/2003 Document Revised: 04/03/2012 Document Reviewed: 11/24/2011 [facility] Patient Information 2014 [employer].

## 2013-10-06 NOTE — Telephone Encounter (Signed)
Patient Information:  Caller Name: Josilyn  Phone: [phone]  Patient: Elizabeth Ward, Elizabeth Ward  Gender: Female  DOB: 22-Feb-1946  Age: 68 Years  PCP: Bluford Kaufmann (Family Practice > 63yrs old)  Office Follow Up:  Does the office need to follow up with this patient?: No  Instructions For The Office: N/A   Symptoms  Reason For Call & Symptoms: Calling about starrted with vomiting and diarrhea at 0300 -10/06/13 after having a bad stomacheche all day yesterday. Vomiting severe all night-up to 13 x with dry heaves, no sleep. Grandson is vomiting also and she watches him daily. Afebrile. Chillls and feels weak. Legs are twitching and arms and hands are aching and she has pain in the "pit of stomach". Taking sips of fluid but feels very nauseous. Last voided small amount at 12:00 noon.  Reviewed Health History In EMR: Yes  Reviewed Medications In EMR: Yes  Reviewed Allergies In EMR: Yes  Reviewed Surgeries / Procedures: Yes  Date of Onset of Symptoms: 10/05/2013  Guideline(s) Used:  Vomiting  Disposition Per Guideline:   Go to ED Now  Reason For Disposition Reached:   Severe vomiting (e.g., 6 or more times/day)  Advice Given:  Reassurance:  Vomiting can be caused by many types of illnesses. It can be caused by a stomach flu virus. It can be caused by eating or drinking something that disagreed with your stomach.  Adults with vomiting need to stay hydrated. This is the most important thing. If you don't drink and replace lost fluids, you may get dehydrated.  For Non-stop Vomiting, Try Sleeping:  Try to go to sleep (Reason: sleep often empties the stomach and relieves the need to vomit).  When you awaken, resume drinking liquids. Water works best initially.  Avoid Nonprescription Medicines:  Stop all nonprescription medicines for 24 hours (Reason: they may make vomiting worse).  Call if vomiting a prescription medicine.  Expected Course:  Vomiting from viral gastritis usually stops in 12  to 48 hours.  If diarrhea is present, it usually lasts for several days.  People with moderate to severe dehydration may need medical care. signs of this include very dry mouth, dizziness, weakness, and decreased urination.  Call Back If:  Vomiting lasts for more than 2 days (48 hours)  Signs of dehydration occur  You become worse.  Patient Will Follow Care Advice:  YES

## 2013-10-06 NOTE — ED Provider Notes (Signed)
CSN: [id number]     Arrival date & time 10/06/13  1320 History   First MD Initiated Contact with Patient 10/06/13 1524     Chief Complaint  Patient presents with  . Emesis  . Diarrhea  . Abdominal Pain     (Consider location/radiation/quality/duration/timing/severity/associated sxs/prior Treatment) HPI Elizabeth Ward is a 68 y.o. female who presents emergency department complaining of nausea, vomiting, diarrhea onset 2 AM today. Patient states she vomited approximately 15 times, and states she had approximately 15 episodes of diarrhea. States stools are watery, emesis was clear at first but now green. She reports that her grandson is with similar symptoms. She denies any fever or chills. She states she does feel weak and lightheaded. She states she did not take any medications for this because she's unable to keep them in ice cubes down. She states every time she tries to take a sip of water or ice cube, she immediately had an episode of emesis and diarrhea. Patient states she is having mild discomfort in her epigastric area otherwise no abdominal pain. She denies any blood in her stool or emesis. She denies a urinary symptoms. She denies any prior abdominal problems however states she does have IBS.   Past Medical History  Diagnosis Date  . ANXIETY 08/14/2008  . INSOMNIA 09/07/2009  . Irritable bowel syndrome 09/07/2009  . MENOPAUSE, SURGICAL 08/14/2008  . UNSPECIFIED VAGINITIS AND VULVOVAGINITIS 06/04/2010   Past Surgical History  Procedure Laterality Date  . Abdominal hysterectomy    . Tonsillectomy    . Colectomy      partial  . Tonsillectomy     History reviewed. No pertinent family history. History  Substance Use Topics  . Smoking status: Never Smoker   . Smokeless tobacco: Never Used  . Alcohol Use: Yes   OB History   Grav Para Term Preterm Abortions TAB SAB Ect Mult Living                 Review of Systems  Constitutional: Negative for fever and chills.  Respiratory:  Negative for cough, chest tightness and shortness of breath.   Cardiovascular: Negative for chest pain, palpitations and leg swelling.  Gastrointestinal: Positive for nausea, vomiting, abdominal pain and diarrhea.  Genitourinary: Negative for dysuria, flank pain and pelvic pain.  Musculoskeletal: Negative for arthralgias, myalgias, neck pain and neck stiffness.  Skin: Negative for rash.  Neurological: Negative for dizziness, weakness and headaches.  All other systems reviewed and are negative.      Allergies  Prochlorperazine edisylate and Tape  Home Medications   Current Outpatient Rx  Name  Route  Sig  Dispense  Refill  . ALPRAZolam (XANAX) 0.25 MG tablet   Oral   Take 0.25 mg by mouth 4 (four) times daily - after meals and at bedtime.         Marland Kitchen estradiol (ESTRACE) 0.1 MG/GM vaginal cream   Vaginal   Place 1 Applicatorful vaginally 2 (two) times a week.         . estrogens, conjugated, (PREMARIN) 0.9 MG tablet   Oral   Take 0.9 mg by mouth daily. Take daily for 21 days then do not take for 7 days.         Marland Kitchen FLUoxetine (PROZAC) 20 MG tablet   Oral   Take 10 mg by mouth daily.         . traZODone (DESYREL) 50 MG tablet   Oral   Take 18 mg by mouth at bedtime.  BP 102/56  Pulse 92  Temp(Src) 97.8 F (36.6 C) (Oral)  Resp 16  SpO2 100% Physical Exam  Nursing note and vitals reviewed. Constitutional: She appears well-developed and well-nourished. No distress.  HENT:  Head: Normocephalic.  Eyes: Conjunctivae are normal.  Neck: Neck supple.  Cardiovascular: Normal rate, regular rhythm and normal heart sounds.   Pulmonary/Chest: Effort normal and breath sounds normal. No respiratory distress. She has no wheezes. She has no rales.  Abdominal: Soft. Bowel sounds are normal. She exhibits no distension and no mass. There is no tenderness. There is no rebound and no guarding.  Musculoskeletal: She exhibits no edema.  Neurological: She is alert.  Skin:  Skin is warm and dry.  Psychiatric: She has a normal mood and affect. Her behavior is normal.    ED Course  Procedures (including critical care time) Labs Review Labs Reviewed  CBC WITH DIFFERENTIAL - Abnormal; Notable for the following:    RBC 5.16 (*)    Hemoglobin 15.1 (*)    Neutrophils Relative % 94 (*)    Lymphocytes Relative 2 (*)    Lymphs Abs 0.2 (*)    All other components within normal limits  COMPREHENSIVE METABOLIC PANEL - Abnormal; Notable for the following:    Sodium 136 (*)    Glucose, Bld 147 (*)    GFR calc non Af Amer 74 (*)    GFR calc Af Amer 86 (*)    All other components within normal limits  URINE CULTURE  LIPASE, BLOOD  URINALYSIS, ROUTINE W REFLEX MICROSCOPIC  I-STAT TROPOININ, ED   Imaging Review Dg Abd 2 Views  10/06/2013   CLINICAL DATA:  Vomiting. Diarrhea. Abdominal pain. Previous colon surgery.  EXAM: ABDOMEN - 2 VIEW  COMPARISON:  None.  FINDINGS: Bowel gas pattern is normal without evidence of ileus, obstruction or free air. Surgical clips are present in the left pelvis. No abnormal calcifications. No significant bony finding.  IMPRESSION: Surgical clips in the left pelvis. No sign of ileus, obstruction or free air.   Electronically Signed   By: Nelson Chimes M.D.   On: 10/06/2013 16:40     EKG Interpretation None       Date: 10/07/2013  Rate: 96  Rhythm: normal sinus rhythm  QRS Axis: right  Intervals: normal  ST/T Wave abnormalities: normal  Conduction Disutrbances:none  Narrative Interpretation:   Old EKG Reviewed: none available   MDM   Final diagnoses:  Nausea vomiting and diarrhea   3:52 PM  Pt with epigastric abdominal pain, with no tenderness on examination. Numerous episodes of nausea and vomiting. No blood in stool or emesis. Prior hysterectomy and colectomy from endometriosis/and obstruction. Will get blood work, x-ray abdomen, UA. Fluids and zofran ordered.   Patient is feeling much better after fluids and Zofran IV.  Her labs and x-ray are unremarkable. She is able to tolerate by mouth fluids in emergency department. No diarrhea or episodes of emesis. Her abdomen continues to be soft and non-tender. Discussed with Dr. Dorna Mai who has seen pt as well. Home with zofran. Most likely viral Gastroenteritis.   Filed Vitals:   10/06/13 1341 10/06/13 1737 10/06/13 1918  BP: 102/56 115/55 115/65  Pulse: 92 90 87  Temp: 97.8 F (36.6 C) 97.9 F (36.6 C) 97.9 F (36.6 C)  TempSrc: Oral Oral Oral  Resp: 16 16 16   SpO2: 100% 100% 99%       Terrion Gencarelli A Eiman Maret, PA-C 10/07/13 0110

## 2013-10-06 NOTE — ED Notes (Signed)
She states she awoke at about 0300 today with n/v/d and has had ~6 episodes of this.  She is in no distress and c/o persistent nausea.

## 2013-10-07 LAB — URINE CULTURE
COLONY COUNT: NO GROWTH
CULTURE: NO GROWTH

## 2013-10-07 NOTE — Telephone Encounter (Signed)
Noted  

## 2013-10-14 IMAGING — CR DG CHEST 2V
2 series · 2 of 2 positions shown · non-contrast
Comparison: None.

CLINICAL DATA: Chest pressure, shortness of breath, and dizziness
and vomiting.

CHEST - 2 VIEW

[w chest pa]
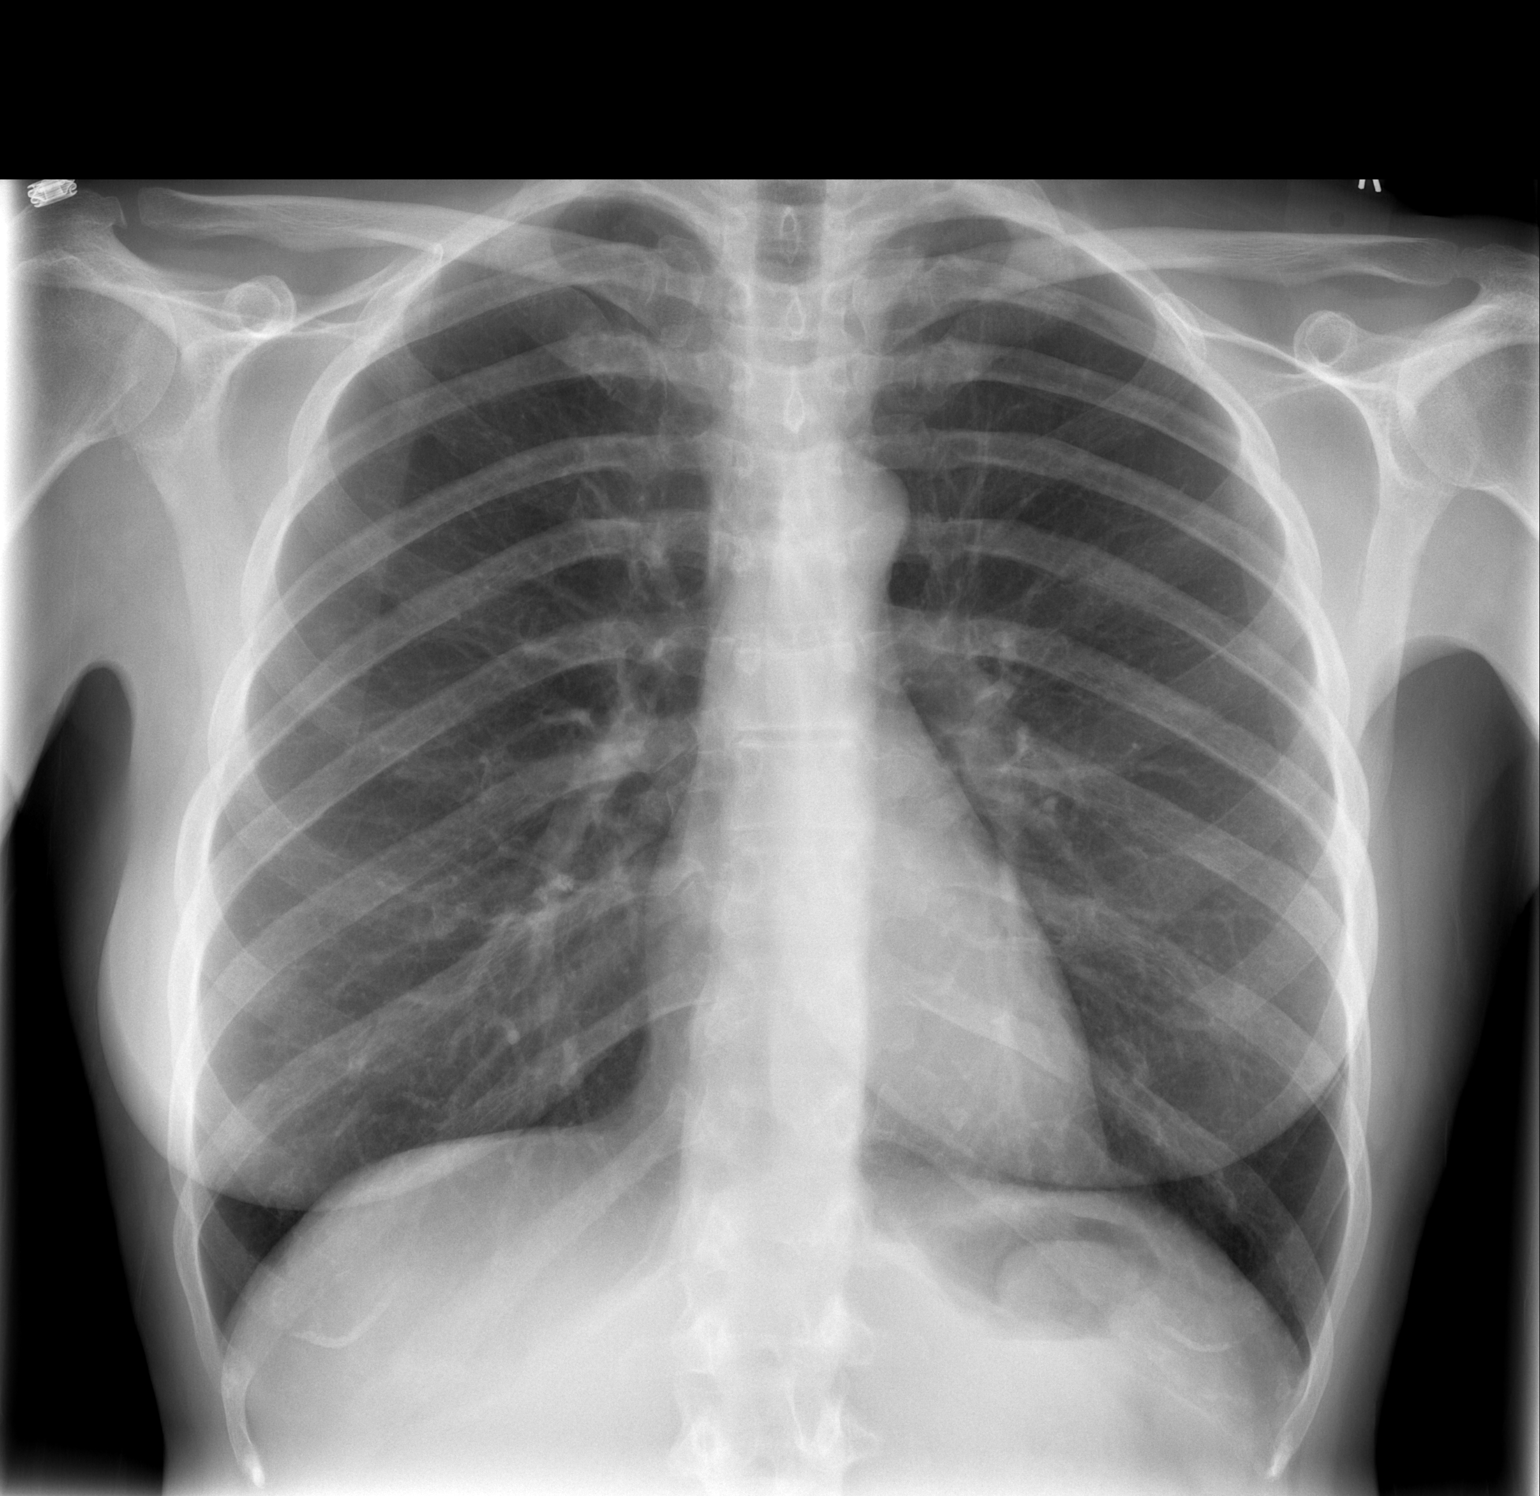

[w chest lat]
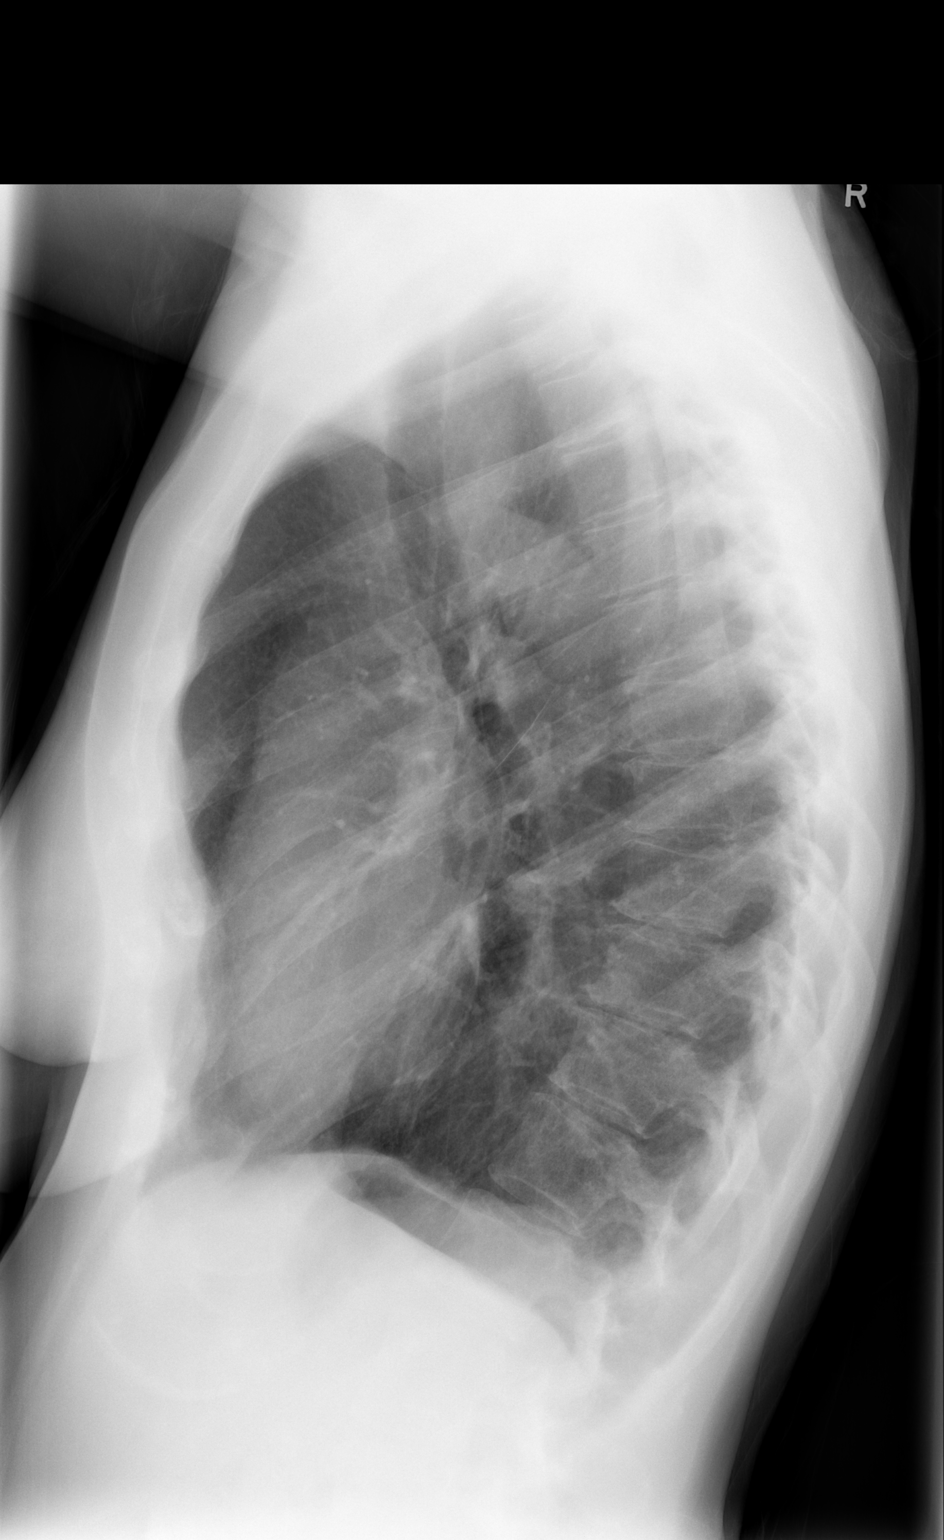

[2 of 2 positions shown; findings below may reference images not displayed]

FINDINGS: The heart size and pulmonary vascularity are normal. The
lungs appear clear and expanded without focal air space disease or
consolidation. No blunting of the costophrenic angles.  No
pneumothorax.  Mediastinal contours appear intact.
IMPRESSION: No evidence of active pulmonary disease.

## 2013-10-22 DIAGNOSIS — L820 Inflamed seborrheic keratosis: Secondary | ICD-10-CM | POA: Diagnosis not present

## 2013-10-22 DIAGNOSIS — L819 Disorder of pigmentation, unspecified: Secondary | ICD-10-CM | POA: Diagnosis not present

## 2013-10-22 DIAGNOSIS — L821 Other seborrheic keratosis: Secondary | ICD-10-CM | POA: Diagnosis not present

## 2013-10-22 DIAGNOSIS — L723 Sebaceous cyst: Secondary | ICD-10-CM | POA: Diagnosis not present

## 2013-10-22 DIAGNOSIS — D1801 Hemangioma of skin and subcutaneous tissue: Secondary | ICD-10-CM | POA: Diagnosis not present

## 2013-10-22 DIAGNOSIS — L570 Actinic keratosis: Secondary | ICD-10-CM | POA: Diagnosis not present

## 2013-11-10 ENCOUNTER — Other Ambulatory Visit: Payer: Self-pay | Admitting: Internal Medicine

## 2013-12-08 ENCOUNTER — Other Ambulatory Visit: Payer: Self-pay | Admitting: Internal Medicine

## 2014-01-08 ENCOUNTER — Other Ambulatory Visit: Payer: Self-pay | Admitting: Internal Medicine

## 2014-01-13 ENCOUNTER — Telehealth: Payer: Self-pay | Admitting: Internal Medicine

## 2014-01-13 MED ORDER — ALPRAZOLAM 0.25 MG PO TABS: ORAL_TABLET | ORAL | Status: DC

## 2014-01-13 NOTE — Telephone Encounter (Signed)
Pt following up on rx request for ALPRAZolam (XANAX) 0.25 MG tablet [facility] [address]

## 2014-01-13 NOTE — Telephone Encounter (Signed)
Rx called in to pharmacy. 

## 2014-04-17 ENCOUNTER — Other Ambulatory Visit: Payer: Self-pay | Admitting: Internal Medicine

## 2014-04-24 DIAGNOSIS — L821 Other seborrheic keratosis: Secondary | ICD-10-CM | POA: Diagnosis not present

## 2014-06-05 ENCOUNTER — Other Ambulatory Visit: Payer: Self-pay | Admitting: Internal Medicine

## 2014-07-01 ENCOUNTER — Other Ambulatory Visit: Payer: Self-pay | Admitting: Internal Medicine

## 2014-07-01 ENCOUNTER — Telehealth: Payer: Self-pay | Admitting: Internal Medicine

## 2014-07-01 MED ORDER — ALPRAZOLAM 0.25 MG PO TABS: ORAL_TABLET | ORAL | Status: DC

## 2014-07-01 NOTE — Telephone Encounter (Signed)
Pt has made an appt and would like her alprazolam refilled

## 2014-07-01 NOTE — Telephone Encounter (Signed)
Pt notified Rx called into pharmacy enough till appt in Feb.

## 2014-08-21 ENCOUNTER — Telehealth: Payer: Self-pay | Admitting: Internal Medicine

## 2014-08-21 NOTE — Telephone Encounter (Signed)
Pt states her hair has been falling out and she has had extreme fatigue. pt would like to know if dr would add additional tsh labs to her cpe labs on 2/2 and check for any thyroid issues  Pt has medicare w/ [facility] and not sure if they will pay for any additional labs.

## 2014-08-21 NOTE — Telephone Encounter (Signed)
Spoke to pt, told her TSH is done with physical labs and if additional labs are needed will order at time of physical. Pt verbalized understanding.

## 2014-08-25 ENCOUNTER — Other Ambulatory Visit (INDEPENDENT_AMBULATORY_CARE_PROVIDER_SITE_OTHER): Payer: Medicare Other

## 2014-08-25 DIAGNOSIS — G4700 Insomnia, unspecified: Secondary | ICD-10-CM

## 2014-08-25 DIAGNOSIS — F4323 Adjustment disorder with mixed anxiety and depressed mood: Secondary | ICD-10-CM

## 2014-08-25 DIAGNOSIS — Z0000 Encounter for general adult medical examination without abnormal findings: Secondary | ICD-10-CM | POA: Diagnosis not present

## 2014-08-25 LAB — POCT URINALYSIS DIPSTICK
Bilirubin, UA: NEGATIVE
Blood, UA: NEGATIVE
Glucose, UA: NEGATIVE
Ketones, UA: NEGATIVE
Leukocytes, UA: NEGATIVE
Nitrite, UA: NEGATIVE
Protein, UA: NEGATIVE
Spec Grav, UA: 1.015
Urobilinogen, UA: 0.2
pH, UA: 8

## 2014-08-25 LAB — COMPREHENSIVE METABOLIC PANEL
ALT: 12 U/L (ref 0–35)
AST: 12 U/L (ref 0–37)
Albumin: 3.9 g/dL (ref 3.5–5.2)
Alkaline Phosphatase: 34 U/L — ABNORMAL LOW (ref 39–117)
BUN: 15 mg/dL (ref 6–23)
CO2: 29 mEq/L (ref 19–32)
CREATININE: 0.76 mg/dL (ref 0.40–1.20)
Calcium: 9 mg/dL (ref 8.4–10.5)
Chloride: 100 mEq/L (ref 96–112)
GFR: 80.66 mL/min (ref >60.00)
GLUCOSE: 88 mg/dL (ref 70–99)
POTASSIUM: 4.2 meq/L (ref 3.5–5.1)
SODIUM: 134 meq/L — AB (ref 135–145)
TOTAL PROTEIN: 6.6 g/dL (ref 6.0–8.3)
Total Bilirubin: 0.9 mg/dL (ref 0.2–1.2)

## 2014-08-25 LAB — CBC WITH DIFFERENTIAL/PLATELET
Basophils Absolute: 0 10*3/uL (ref 0.0–0.1)
Basophils Relative: 1 % (ref 0.0–3.0)
Eosinophils Absolute: 0.1 10*3/uL (ref 0.0–0.7)
Eosinophils Relative: 1.5 % (ref 0.0–5.0)
HCT: 35.8 % — ABNORMAL LOW (ref 36.0–46.0)
Hemoglobin: 12.2 g/dL (ref 12.0–15.0)
Lymphocytes Relative: 32.6 % (ref 12.0–46.0)
Lymphs Abs: 1.5 10*3/uL (ref 0.7–4.0)
MCHC: 34 g/dL (ref 30.0–36.0)
MCV: 84.6 fl (ref 78.0–100.0)
Monocytes Absolute: 0.5 10*3/uL (ref 0.1–1.0)
Monocytes Relative: 10.2 % (ref 3.0–12.0)
Neutro Abs: 2.6 10*3/uL (ref 1.4–7.7)
Neutrophils Relative %: 54.7 % (ref 43.0–77.0)
Platelets: 246 10*3/uL (ref 150.0–400.0)
RBC: 4.24 Mil/uL (ref 3.87–5.11)
RDW: 13.4 % (ref 11.5–15.5)
WBC: 4.7 10*3/uL (ref 4.0–10.5)

## 2014-08-25 LAB — LIPID PANEL
Cholesterol: 154 mg/dL (ref 0–200)
HDL: 89.3 mg/dL (ref >39.00)
LDL Cholesterol: 44 mg/dL (ref 0–99)
NonHDL: 64.7
Total CHOL/HDL Ratio: 2
Triglycerides: 106 mg/dL (ref 0.0–149.0)
VLDL: 21.2 mg/dL (ref 0.0–40.0)

## 2014-08-25 LAB — TSH: TSH: 1.86 u[IU]/mL (ref 0.35–4.50)

## 2014-09-03 ENCOUNTER — Encounter: Payer: Self-pay | Admitting: *Deleted

## 2014-09-03 ENCOUNTER — Ambulatory Visit (INDEPENDENT_AMBULATORY_CARE_PROVIDER_SITE_OTHER): Payer: Medicare Other | Admitting: Internal Medicine

## 2014-09-03 ENCOUNTER — Encounter: Payer: Self-pay | Admitting: Internal Medicine

## 2014-09-03 VITALS — BP 110/70 | HR 80 | Temp 97.9°F | Resp 20 | Ht 61.5 in | Wt 114.0 lb

## 2014-09-03 DIAGNOSIS — E8941 Symptomatic postprocedural ovarian failure: Secondary | ICD-10-CM

## 2014-09-03 DIAGNOSIS — K589 Irritable bowel syndrome without diarrhea: Secondary | ICD-10-CM

## 2014-09-03 DIAGNOSIS — Z0000 Encounter for general adult medical examination without abnormal findings: Secondary | ICD-10-CM

## 2014-09-03 DIAGNOSIS — N958 Other specified menopausal and perimenopausal disorders: Secondary | ICD-10-CM

## 2014-09-03 MED ORDER — FLUOXETINE HCL 20 MG PO TABS: ORAL_TABLET | ORAL | Status: DC

## 2014-09-03 MED ORDER — ALPRAZOLAM 0.25 MG PO TABS: ORAL_TABLET | ORAL | Status: DC

## 2014-09-03 MED ORDER — METHOCARBAMOL 500 MG PO TABS: 500.0000 mg | ORAL_TABLET | Freq: Four times a day (QID) | ORAL | Status: DC

## 2014-09-03 MED ORDER — TRAZODONE HCL 50 MG PO TABS: ORAL_TABLET | ORAL | Status: DC

## 2014-09-03 NOTE — Progress Notes (Signed)
Patient ID: Elizabeth Ward, female   DOB: 1945-09-20, 69 y.o.   MRN: [id number]  Subjective:    Patient ID: Elizabeth Ward, female    DOB: 1946/06/20, 69 y.o.   MRN: [id number]  HPI  Chief Complaint: To Establish. Annual exam  History of Present Illness:   69  year-old patient is seen today for an annual exam.  Medical problems include anxiety IBS insomnia. She is also had a surgical menopause in 1992 due to endometriosis. She has been on hormone replacement therapy.She has been adjusting to the death of her son who died in the 2012/03/30.  She is primary caregiver for her grandson   she has been on hormone replacement therapy since her surgical menopause   Current Allergies:  ! COMPAZINE   Past Medical History:   surgical menopause  history of endometriosis  Anxiety  insomnia  neck pain  IBS   Past Surgical History:   Colectomy partial 1992 for endometrioma  Hysterectomy history of endometriosis 1983  Tonsillectomy 1954  Colonoscopy in 1995   Family History:   father died age 25, lung cancer  mother died age 11 complications of Hodgkin's disease  No siblings   Social History:   Married  Never Smoked  one son  died 03-30-2012  Risk Factors:  Tobacco use: never     Review of Systems  Constitutional: Negative for fever, appetite change, fatigue and unexpected weight change.  HENT: Negative for congestion, dental problem, ear pain, hearing loss, mouth sores, nosebleeds, sinus pressure, sore throat, tinnitus, trouble swallowing and voice change.   Eyes: Negative for photophobia, pain, redness and visual disturbance.  Respiratory: Negative for cough, chest tightness and shortness of breath.   Cardiovascular: Negative for chest pain, palpitations and leg swelling.  Gastrointestinal: Negative for nausea, vomiting, abdominal pain, diarrhea, constipation, blood in stool, abdominal distention and rectal pain.  Genitourinary: Negative for dysuria, urgency, frequency,  hematuria, flank pain, vaginal bleeding, vaginal discharge, difficulty urinating, genital sores, vaginal pain, menstrual problem and pelvic pain.  Musculoskeletal: Positive for arthralgias. Negative for back pain and neck stiffness.  Skin: Negative for rash.  Neurological: Negative for dizziness, syncope, speech difficulty, weakness, light-headedness, numbness and headaches.  Hematological: Negative for adenopathy. Does not bruise/bleed easily.  Psychiatric/Behavioral: Positive for sleep disturbance, dysphoric mood and decreased concentration. Negative for suicidal ideas, behavioral problems, self-injury and agitation. The patient is nervous/anxious.   . And pelvis, same dose.  Recheck in 4 weeks     Objective:   Physical Exam  Constitutional: She is oriented to person, place, and time. She appears well-developed and well-nourished.  HENT:  Head: Normocephalic and atraumatic.  Right Ear: External ear normal.  Left Ear: External ear normal.  Mouth/Throat: Oropharynx is clear and moist.  Eyes: Conjunctivae and EOM are normal.  Neck: Normal range of motion. Neck supple. No JVD present. No thyromegaly present.  Cardiovascular: Normal rate, regular rhythm, normal heart sounds and intact distal pulses.   No murmur heard. Pulmonary/Chest: Effort normal and breath sounds normal. She has no wheezes. She has no rales.  Abdominal: Soft. Bowel sounds are normal. She exhibits no distension and no mass. There is no tenderness. There is no rebound and no guarding.  Musculoskeletal: Normal range of motion. She exhibits no edema or tenderness.  Neurological: She is alert and oriented to person, place, and time. She has normal reflexes. No cranial nerve deficit. She exhibits normal muscle tone. Coordination normal.  Skin: Skin is warm and dry.  No rash noted.  Psychiatric: She has a normal mood and affect. Her behavior is normal.   1. Risk factors, based on past  M,S,F history- no significant  cardiovascular risk factors  2.  Physical activities: No regimented exercise program but remains active. At ideal body weight  3.  Depression/mood: History of reactive depression since the death of her son in Apr 23, 2023 of last year  4.  Hearing: No deficits  5.  ADL's: Independent in aspects of daily living  6.  Fall risk: Low  7.  Home safety: No problems identified  8.  Height weight, and visual acuity; height and weight stable no change in visual acuity  9.  Counseling: Behavioral health counseling recommended. Regular exercise regimen encouraged  10. Lab orders based on risk factors: Laboratory profile reviewed  11. Referral : [facility] receive counseling [facility]. Followup colonoscopy encouraged as well as mammogram  12. Care plan: As above  13. Cognitive assessment: Alert and oriented normal affect no cognitive dysfunction  14.  Preventive services will include annual health examination was screening lab.  The patient will consider a follow-up bone density in one or 2 years.  Follow colonoscopy strongly encouraged  Patient was provided with a written and personalized care plan  15.  Provider list includes primary care medicine, gastroenterology and ophthalmology          Assessment & Plan:   Preventive health examination. Laboratory studies reviewed and discussed with patient screening colonoscopy also encouraged. She will consider this Anxiety disorder. Alprazolam refilled counseling recommended. She will consider getting help at h.  er church Will  continue Prozac to 20 mg daily  Recheck 6 months

## 2014-09-03 NOTE — Patient Instructions (Signed)
Take a calcium supplement, plus [phone] units of vitamin D  Health Maintenance Adopting a healthy lifestyle and getting preventive care can go a long way to promote health and wellness. Talk with your health care provider about what schedule of regular examinations is right for you. This is a good chance for you to check in with your provider about disease prevention and staying healthy. In between checkups, there are plenty of things you can do on your own. Experts have done a lot of research about which lifestyle changes and preventive measures are most likely to keep you healthy. Ask your health care provider for more information. WEIGHT AND DIET  Eat a healthy diet  Be sure to include plenty of vegetables, fruits, low-fat dairy products, and lean protein.  Do not eat a lot of foods high in solid fats, added sugars, or salt.  Get regular exercise. This is one of the most important things you can do for your health.  Most adults should exercise for at least 150 minutes each week. The exercise should increase your heart rate and make you sweat (moderate-intensity exercise).  Most adults should also do strengthening exercises at least twice a week. This is in addition to the moderate-intensity exercise.  Maintain a healthy weight  Body mass index (BMI) is a measurement that can be used to identify possible weight problems. It estimates body fat based on height and weight. Your health care provider can help determine your BMI and help you achieve or maintain a healthy weight.  For females 75 years of age and older:   A BMI below 18.5 is considered underweight.  A BMI of 18.5 to 24.9 is normal.  A BMI of 25 to 29.9 is considered overweight.  A BMI of 30 and above is considered obese.  Watch levels of cholesterol and blood lipids  You should start having your blood tested for lipids and cholesterol at 67 years of age, then have this test every 5 years.  You may need to have your  cholesterol levels checked more often if:  Your lipid or cholesterol levels are high.  You are older than 67 years of age.  You are at high risk for heart disease.  CANCER SCREENING   Lung Cancer  Lung cancer screening is recommended for adults 4-7 years old who are at high risk for lung cancer because of a history of smoking.  A yearly low-dose CT scan of the lungs is recommended for people who:  Currently smoke.  Have quit within the past 15 years.  Have at least a 30-pack-year history of smoking. A pack year is smoking an average of one pack of cigarettes a day for 1 year.  Yearly screening should continue until it has been 15 years since you quit.  Yearly screening should stop if you develop a health problem that would prevent you from having lung cancer treatment.  Breast Cancer  Practice breast self-awareness. This means understanding how your breasts normally appear and feel.  It also means doing regular breast self-exams. Let your health care provider know about any changes, no matter how small.  If you are in your 20s or 30s, you should have a clinical breast exam (CBE) by a health care provider every 1-3 years as part of a regular health exam.  If you are 25 or older, have a CBE every year. Also consider having a breast X-ray (mammogram) every year.  If you have a family history of breast cancer, talk to  your health care provider about genetic screening.  If you are at high risk for breast cancer, talk to your health care provider about having an MRI and a mammogram every year.  Breast cancer gene (BRCA) assessment is recommended for women who have family members with BRCA-related cancers. BRCA-related cancers include:  Breast.  Ovarian.  Tubal.  Peritoneal cancers.  Results of the assessment will determine the need for genetic counseling and BRCA1 and BRCA2 testing. Cervical Cancer Routine pelvic examinations to screen for cervical cancer are no  longer recommended for nonpregnant women who are considered low risk for cancer of the pelvic organs (ovaries, uterus, and vagina) and who do not have symptoms. A pelvic examination may be necessary if you have symptoms including those associated with pelvic infections. Ask your health care provider if a screening pelvic exam is right for you.   The Pap test is the screening test for cervical cancer for women who are considered at risk.  If you had a hysterectomy for a problem that was not cancer or a condition that could lead to cancer, then you no longer need Pap tests.  If you are older than 65 years, and you have had normal Pap tests for the past 10 years, you no longer need to have Pap tests.  If you have had past treatment for cervical cancer or a condition that could lead to cancer, you need Pap tests and screening for cancer for at least 20 years after your treatment.  If you no longer get a Pap test, assess your risk factors if they change (such as having a new sexual partner). This can affect whether you should start being screened again.  Some women have medical problems that increase their chance of getting cervical cancer. If this is the case for you, your health care provider may recommend more frequent screening and Pap tests.  The human papillomavirus (HPV) test is another test that may be used for cervical cancer screening. The HPV test looks for the virus that can cause cell changes in the cervix. The cells collected during the Pap test can be tested for HPV.  The HPV test can be used to screen women 58 years of age and older. Getting tested for HPV can extend the interval between normal Pap tests from three to five years.  An HPV test also should be used to screen women of any age who have unclear Pap test results.  After 67 years of age, women should have HPV testing as often as Pap tests.  Colorectal Cancer  This type of cancer can be detected and often  prevented.  Routine colorectal cancer screening usually begins at 67 years of age and continues through 67 years of age.  Your health care provider may recommend screening at an earlier age if you have risk factors for colon cancer.  Your health care provider may also recommend using home test kits to check for hidden blood in the stool.  A small camera at the end of a tube can be used to examine your colon directly (sigmoidoscopy or colonoscopy). This is done to check for the earliest forms of colorectal cancer.  Routine screening usually begins at age 43.  Direct examination of the colon should be repeated every 5-10 years through 67 years of age. However, you may need to be screened more often if early forms of precancerous polyps or small growths are found. Skin Cancer  Check your skin from head to toe regularly.  Tell  your health care provider about any new moles or changes in moles, especially if there is a change in a mole's shape or color.  Also tell your health care provider if you have a mole that is larger than the size of a pencil eraser.  Always use sunscreen. Apply sunscreen liberally and repeatedly throughout the day.  Protect yourself by wearing long sleeves, pants, a wide-brimmed hat, and sunglasses whenever you are outside. HEART DISEASE, DIABETES, AND HIGH BLOOD PRESSURE   Have your blood pressure checked at least every 1-2 years. High blood pressure causes heart disease and increases the risk of stroke.  If you are between 1 years and 61 years old, ask your health care provider if you should take aspirin to prevent strokes.  Have regular diabetes screenings. This involves taking a blood sample to check your fasting blood sugar level.  If you are at a normal weight and have a low risk for diabetes, have this test once every three years after 67 years of age.  If you are overweight and have a high risk for diabetes, consider being tested at a younger age or more  often. PREVENTING INFECTION  Hepatitis B  If you have a higher risk for hepatitis B, you should be screened for this virus. You are considered at high risk for hepatitis B if:  You were born in a country where hepatitis B is common. Ask your health care provider which countries are considered high risk.  Your parents were born in a high-risk country, and you have not been immunized against hepatitis B (hepatitis B vaccine).  You have HIV or AIDS.  You use needles to inject street drugs.  You live with someone who has hepatitis B.  You have had sex with someone who has hepatitis B.  You get hemodialysis treatment.  You take certain medicines for conditions, including cancer, organ transplantation, and autoimmune conditions. Hepatitis C  Blood testing is recommended for:  Everyone born from [birth year] through 1963.  Anyone with known risk factors for hepatitis C. Sexually transmitted infections (STIs)  You should be screened for sexually transmitted infections (STIs) including gonorrhea and chlamydia if:  You are sexually active and are younger than 67 years of age.  You are older than 67 years of age and your health care provider tells you that you are at risk for this type of infection.  Your sexual activity has changed since you were last screened and you are at an increased risk for chlamydia or gonorrhea. Ask your health care provider if you are at risk.  If you do not have HIV, but are at risk, it may be recommended that you take a prescription medicine daily to prevent HIV infection. This is called pre-exposure prophylaxis (PrEP). You are considered at risk if:  You are sexually active and do not regularly use condoms or know the HIV status of your partner(s).  You take drugs by injection.  You are sexually active with a partner who has HIV. Talk with your health care provider about whether you are at high risk of being infected with HIV. If you choose to begin PrEP, you  should first be tested for HIV. You should then be tested every 3 months for as long as you are taking PrEP.  PREGNANCY   If you are premenopausal and you may become pregnant, ask your health care provider about preconception counseling.  If you may become pregnant, take 400 to 800 micrograms (mcg) of folic acid every  day.  If you want to prevent pregnancy, talk to your health care provider about birth control (contraception). OSTEOPOROSIS AND MENOPAUSE   Osteoporosis is a disease in which the bones lose minerals and strength with aging. This can result in serious bone fractures. Your risk for osteoporosis can be identified using a bone density scan.  If you are 5 years of age or older, or if you are at risk for osteoporosis and fractures, ask your health care provider if you should be screened.  Ask your health care provider whether you should take a calcium or vitamin D supplement to lower your risk for osteoporosis.  Menopause may have certain physical symptoms and risks.  Hormone replacement therapy may reduce some of these symptoms and risks. Talk to your health care provider about whether hormone replacement therapy is right for you.  HOME CARE INSTRUCTIONS   Schedule regular health, dental, and eye exams.  Stay current with your immunizations.   Do not use any tobacco products including cigarettes, chewing tobacco, or electronic cigarettes.  If you are pregnant, do not drink alcohol.  If you are breastfeeding, limit how much and how often you drink alcohol.  Limit alcohol intake to no more than 1 drink per day for nonpregnant women. One drink equals 12 ounces of beer, 5 ounces of wine, or 1 ounces of hard liquor.  Do not use street drugs.  Do not share needles.  Ask your health care provider for help if you need support or information about quitting drugs.  Tell your health care provider if you often feel depressed.  Tell your health care provider if you have ever  been abused or do not feel safe at home. Document Released: 01/23/2011 Document Revised: 11/24/2013 Document Reviewed: 06/11/2013 [facility] Patient Information 2015 [place], [place]. This information is not intended to replace advice given to you by your health care provider. Make sure you discuss any questions you have with your health care provider.

## 2014-09-08 ENCOUNTER — Other Ambulatory Visit: Payer: Self-pay | Admitting: Internal Medicine

## 2014-09-15 ENCOUNTER — Other Ambulatory Visit: Payer: Self-pay | Admitting: Internal Medicine

## 2014-10-09 ENCOUNTER — Telehealth: Payer: Self-pay | Admitting: Internal Medicine

## 2014-10-09 NOTE — Telephone Encounter (Signed)
Pt declined Flu shot now. Chart updated.

## 2014-10-09 NOTE — Telephone Encounter (Signed)
Patient returned Misty's call about the flu shot.  She states she is not sure if she is going to get it and will call back when her schedule opens up.

## 2014-12-13 ENCOUNTER — Other Ambulatory Visit: Payer: Self-pay | Admitting: Internal Medicine

## 2014-12-16 ENCOUNTER — Telehealth: Payer: Self-pay | Admitting: Family Medicine

## 2014-12-16 NOTE — Telephone Encounter (Signed)
Pt will have mammogram tomorrow at [facility]

## 2014-12-16 NOTE — Telephone Encounter (Signed)
Left a message for the pt to return my call.  Need to see if she has had her yearly mammogram.  If so, where and when.

## 2014-12-17 DIAGNOSIS — Z803 Family history of malignant neoplasm of breast: Secondary | ICD-10-CM | POA: Diagnosis not present

## 2014-12-17 DIAGNOSIS — Z1231 Encounter for screening mammogram for malignant neoplasm of breast: Secondary | ICD-10-CM | POA: Diagnosis not present

## 2014-12-17 LAB — HM MAMMOGRAPHY: HM Mammogram: NEGATIVE

## 2014-12-30 ENCOUNTER — Encounter: Payer: Self-pay | Admitting: Internal Medicine

## 2015-01-29 ENCOUNTER — Other Ambulatory Visit: Payer: Self-pay | Admitting: Internal Medicine

## 2015-03-05 DIAGNOSIS — L821 Other seborrheic keratosis: Secondary | ICD-10-CM | POA: Diagnosis not present

## 2015-03-05 DIAGNOSIS — L814 Other melanin hyperpigmentation: Secondary | ICD-10-CM | POA: Diagnosis not present

## 2015-03-05 DIAGNOSIS — D1801 Hemangioma of skin and subcutaneous tissue: Secondary | ICD-10-CM | POA: Diagnosis not present

## 2015-03-05 DIAGNOSIS — L720 Epidermal cyst: Secondary | ICD-10-CM | POA: Diagnosis not present

## 2015-03-05 DIAGNOSIS — L570 Actinic keratosis: Secondary | ICD-10-CM | POA: Diagnosis not present

## 2015-03-05 DIAGNOSIS — Z85828 Personal history of other malignant neoplasm of skin: Secondary | ICD-10-CM | POA: Diagnosis not present

## 2015-03-05 DIAGNOSIS — L820 Inflamed seborrheic keratosis: Secondary | ICD-10-CM | POA: Diagnosis not present

## 2015-06-21 ENCOUNTER — Other Ambulatory Visit: Payer: Self-pay | Admitting: Internal Medicine

## 2015-06-22 ENCOUNTER — Other Ambulatory Visit: Payer: Self-pay | Admitting: Internal Medicine

## 2015-06-23 ENCOUNTER — Other Ambulatory Visit: Payer: Self-pay | Admitting: Internal Medicine

## 2015-07-30 ENCOUNTER — Other Ambulatory Visit: Payer: Self-pay | Admitting: Internal Medicine

## 2015-08-23 ENCOUNTER — Other Ambulatory Visit: Payer: Self-pay | Admitting: Internal Medicine

## 2015-10-14 ENCOUNTER — Other Ambulatory Visit: Payer: Self-pay | Admitting: Internal Medicine

## 2015-11-02 DIAGNOSIS — R7309 Other abnormal glucose: Secondary | ICD-10-CM | POA: Diagnosis not present

## 2015-11-02 DIAGNOSIS — K529 Noninfective gastroenteritis and colitis, unspecified: Secondary | ICD-10-CM | POA: Diagnosis not present

## 2015-11-02 DIAGNOSIS — R112 Nausea with vomiting, unspecified: Secondary | ICD-10-CM | POA: Diagnosis not present

## 2015-11-10 ENCOUNTER — Other Ambulatory Visit: Payer: Self-pay | Admitting: Internal Medicine

## 2015-12-13 ENCOUNTER — Other Ambulatory Visit: Payer: Self-pay | Admitting: Internal Medicine

## 2015-12-13 NOTE — Telephone Encounter (Signed)
Sent to the pharmacy by e-scribe for 6 months.  Pt has yearly 08/2015.  Should return in 1 year.

## 2015-12-29 DIAGNOSIS — H2512 Age-related nuclear cataract, left eye: Secondary | ICD-10-CM | POA: Diagnosis not present

## 2015-12-29 DIAGNOSIS — H2511 Age-related nuclear cataract, right eye: Secondary | ICD-10-CM | POA: Diagnosis not present

## 2015-12-29 DIAGNOSIS — H35342 Macular cyst, hole, or pseudohole, left eye: Secondary | ICD-10-CM | POA: Diagnosis not present

## 2016-01-04 DIAGNOSIS — H2511 Age-related nuclear cataract, right eye: Secondary | ICD-10-CM | POA: Diagnosis not present

## 2016-01-04 DIAGNOSIS — H33101 Unspecified retinoschisis, right eye: Secondary | ICD-10-CM | POA: Diagnosis not present

## 2016-01-04 DIAGNOSIS — H35341 Macular cyst, hole, or pseudohole, right eye: Secondary | ICD-10-CM | POA: Diagnosis not present

## 2016-01-04 DIAGNOSIS — H35371 Puckering of macula, right eye: Secondary | ICD-10-CM | POA: Diagnosis not present

## 2016-01-18 ENCOUNTER — Telehealth: Payer: Self-pay | Admitting: Internal Medicine

## 2016-01-18 NOTE — Telephone Encounter (Signed)
Pt needs an appt for medication follow up. Please schedule.

## 2016-01-18 NOTE — Telephone Encounter (Signed)
Pt request refill  ALPRAZolam (XANAX) 0.25 MG tablet  [facility]  Pt not seen since 2//2016 Pt called several weeks ago and was told she needs cpe , and was scheduled for 04/07/16. Pt wants to know if this can be refilled until then, or does pt need earlier appt?

## 2016-01-19 ENCOUNTER — Other Ambulatory Visit: Payer: Self-pay | Admitting: Internal Medicine

## 2016-01-19 NOTE — Telephone Encounter (Signed)
Pt has an appt on 01-21-16

## 2016-01-21 ENCOUNTER — Encounter: Payer: Self-pay | Admitting: Internal Medicine

## 2016-01-21 ENCOUNTER — Ambulatory Visit (INDEPENDENT_AMBULATORY_CARE_PROVIDER_SITE_OTHER): Payer: Medicare Other | Admitting: Internal Medicine

## 2016-01-21 VITALS — BP 124/62 | HR 81 | Temp 98.2°F | Ht 61.5 in | Wt 109.0 lb

## 2016-01-21 DIAGNOSIS — L659 Nonscarring hair loss, unspecified: Secondary | ICD-10-CM | POA: Diagnosis not present

## 2016-01-21 DIAGNOSIS — G4700 Insomnia, unspecified: Secondary | ICD-10-CM | POA: Diagnosis not present

## 2016-01-21 DIAGNOSIS — E8941 Symptomatic postprocedural ovarian failure: Secondary | ICD-10-CM

## 2016-01-21 DIAGNOSIS — G609 Hereditary and idiopathic neuropathy, unspecified: Secondary | ICD-10-CM | POA: Diagnosis not present

## 2016-01-21 DIAGNOSIS — K589 Irritable bowel syndrome without diarrhea: Secondary | ICD-10-CM

## 2016-01-21 DIAGNOSIS — E785 Hyperlipidemia, unspecified: Secondary | ICD-10-CM

## 2016-01-21 LAB — CBC WITH DIFFERENTIAL/PLATELET
BASOS ABS: 0 10*3/uL (ref 0.0–0.1)
Basophils Relative: 0.8 % (ref 0.0–3.0)
EOS PCT: 1.6 % (ref 0.0–5.0)
Eosinophils Absolute: 0.1 10*3/uL (ref 0.0–0.7)
HCT: 38.5 % (ref 36.0–46.0)
Hemoglobin: 13 g/dL (ref 12.0–15.0)
LYMPHS ABS: 1.4 10*3/uL (ref 0.7–4.0)
Lymphocytes Relative: 29.2 % (ref 12.0–46.0)
MCHC: 33.7 g/dL (ref 30.0–36.0)
MCV: 84.8 fl (ref 78.0–100.0)
MONO ABS: 0.5 10*3/uL (ref 0.1–1.0)
Monocytes Relative: 9.9 % (ref 3.0–12.0)
NEUTROS ABS: 2.8 10*3/uL (ref 1.4–7.7)
NEUTROS PCT: 58.5 % (ref 43.0–77.0)
PLATELETS: 266 10*3/uL (ref 150.0–400.0)
RBC: 4.54 Mil/uL (ref 3.87–5.11)
RDW: 13.9 % (ref 11.5–15.5)
WBC: 4.7 10*3/uL (ref 4.0–10.5)

## 2016-01-21 LAB — COMPREHENSIVE METABOLIC PANEL
ALK PHOS: 42 U/L (ref 39–117)
ALT: 14 U/L (ref 0–35)
AST: 14 U/L (ref 0–37)
Albumin: 4.1 g/dL (ref 3.5–5.2)
BILIRUBIN TOTAL: 0.6 mg/dL (ref 0.2–1.2)
BUN: 18 mg/dL (ref 6–23)
CO2: 32 mEq/L (ref 19–32)
Calcium: 9.4 mg/dL (ref 8.4–10.5)
Chloride: 102 mEq/L (ref 96–112)
Creatinine, Ser: 0.9 mg/dL (ref 0.40–1.20)
GFR: 66.09 mL/min (ref >60.00)
GLUCOSE: 83 mg/dL (ref 70–99)
Potassium: 4.4 mEq/L (ref 3.5–5.1)
SODIUM: 137 meq/L (ref 135–145)
TOTAL PROTEIN: 7.2 g/dL (ref 6.0–8.3)

## 2016-01-21 LAB — LIPID PANEL
Cholesterol: 178 mg/dL (ref 0–200)
HDL: 82.5 mg/dL (ref >39.00)
LDL Cholesterol: 63 mg/dL (ref 0–99)
NONHDL: 95.01
Total CHOL/HDL Ratio: 2
Triglycerides: 159 mg/dL — ABNORMAL HIGH (ref 0.0–149.0)
VLDL: 31.8 mg/dL (ref 0.0–40.0)

## 2016-01-21 LAB — TSH: TSH: 1.48 u[IU]/mL (ref 0.35–4.50)

## 2016-01-21 LAB — VITAMIN B12: VITAMIN B 12: 471 pg/mL (ref 211–911)

## 2016-01-21 MED ORDER — ALPRAZOLAM 0.25 MG PO TABS: 0.2500 mg | ORAL_TABLET | Freq: Three times a day (TID) | ORAL | Status: DC | PRN

## 2016-01-21 NOTE — Patient Instructions (Signed)
Return as scheduled for your annual exam 

## 2016-01-21 NOTE — Progress Notes (Signed)
Subjective:    Patient ID: Elizabeth Ward, female    DOB: 1945/09/07, 70 y.o.   MRN: [id number]  HPI  70 year old patient who is seen today in follow-up.  She has not been seen in over one year.  Medical issues include anxiety as well as insomnia.  She has been on trazodone and does take alprazolam when necessary.  She does have a history of surgical menopause and has been on estrogen replacement therapy for decades.  She also uses Estrace vaginal cream.  She has a history of IBS. Concerns include possible peripheral neuropathy with some tingling and numbness involving the soles of her feet. She is followed closely by ophthalmology and is anticipating surgery later this summer. Other complaints include thinning of the hair.  She is also followed by dermatology She is scheduled for an annual exam later this fall  Past Medical History  Diagnosis Date  . ANXIETY 08/14/2008  . INSOMNIA 09/07/2009  . Irritable bowel syndrome 09/07/2009  . MENOPAUSE, SURGICAL 08/14/2008  . UNSPECIFIED VAGINITIS AND VULVOVAGINITIS 06/04/2010     Social History   Social History  . Marital Status: Married    Spouse Name: N/A  . Number of Children: N/A  . Years of Education: N/A   Occupational History  . Not on file.   Social History Main Topics  . Smoking status: Never Smoker   . Smokeless tobacco: Never Used  . Alcohol Use: Yes  . Drug Use: No  . Sexual Activity: Not on file   Other Topics Concern  . Not on file   Social History Narrative    Past Surgical History  Procedure Laterality Date  . Abdominal hysterectomy    . Tonsillectomy    . Colectomy      partial  . Tonsillectomy      No family history on file.  Allergies  Allergen Reactions  . Prochlorperazine Edisylate Other (See Comments)    Tongue spasms(tongue would droop back in to the throat   . Tape Rash    Current Outpatient Prescriptions on File Prior to Visit  Medication Sig Dispense Refill  . estradiol (ESTRACE) 0.1  MG/GM vaginal cream Place 1 Applicatorful vaginally 2 (two) times a week.    . estrogens, conjugated, (PREMARIN) 0.9 MG tablet Take 0.9 mg by mouth daily.     Marland Kitchen FLUoxetine (PROZAC) 20 MG tablet TAKE 1 TABLET (20 MG TOTAL) BY MOUTH DAILY. 90 tablet 1  . FLUoxetine (PROZAC) 20 MG tablet TAKE 1 TABLET (20 MG TOTAL) BY MOUTH DAILY. 90 tablet 1  . methocarbamol (ROBAXIN) 500 MG tablet Take 1 tablet (500 mg total) by mouth 4 (four) times daily. 60 tablet 3  . PREMARIN 0.9 MG tablet TAKE 1 TABLET BY MOUTH EVERY DAY 90 tablet 0  . traZODone (DESYREL) 50 MG tablet TAKE 1/2 TABLET AT BEDTIME AS NEEDED 45 tablet 1  . traZODone (DESYREL) 50 MG tablet TAKE 1/2 TABLET AT BEDTIME AS NEEDED 60 tablet 2  . traZODone (DESYREL) 50 MG tablet TAKE 1/2 TABLET BY MOUTH AT BEDTIME AS NEEDED 45 tablet 1   No current facility-administered medications on file prior to visit.    BP 124/62 mmHg  Pulse 81  Temp(Src) 98.2 F (36.8 C) (Oral)  Ht 5' 1.5" (1.562 m)  Wt 109 lb (49.442 kg)  BMI 20.26 kg/m2  SpO2 95%     Review of Systems  Constitutional: Negative.   HENT: Negative for congestion, dental problem, hearing loss, rhinorrhea, sinus pressure, sore throat  and tinnitus.   Eyes: Positive for visual disturbance. Negative for pain and discharge.  Respiratory: Negative for cough and shortness of breath.   Cardiovascular: Negative for chest pain, palpitations and leg swelling.  Gastrointestinal: Positive for abdominal pain. Negative for nausea, vomiting, diarrhea, constipation, blood in stool and abdominal distention.  Genitourinary: Negative for dysuria, urgency, frequency, hematuria, flank pain, vaginal bleeding, vaginal discharge, difficulty urinating, vaginal pain and pelvic pain.  Musculoskeletal: Negative for joint swelling, arthralgias and gait problem.  Skin: Negative for rash.       Hair loss  Neurological: Positive for numbness. Negative for dizziness, syncope, speech difficulty, weakness and  headaches.  Hematological: Negative for adenopathy.  Psychiatric/Behavioral: Negative for behavioral problems, dysphoric mood and agitation. The patient is not nervous/anxious.        Objective:   Physical Exam  Constitutional: She is oriented to person, place, and time. She appears well-developed and well-nourished. No distress.  Blood pressure 124/62  HENT:  Head: Normocephalic.  Right Ear: External ear normal.  Left Ear: External ear normal.  Mouth/Throat: Oropharynx is clear and moist.  Eyes: Conjunctivae and EOM are normal. Pupils are equal, round, and reactive to light.  Neck: Normal range of motion. Neck supple. No thyromegaly present.  Cardiovascular: Normal rate, regular rhythm, normal heart sounds and intact distal pulses.   Pulmonary/Chest: Effort normal and breath sounds normal.  Abdominal: Soft. Bowel sounds are normal. She exhibits no mass. There is no tenderness.  Musculoskeletal: Normal range of motion.  Lymphadenopathy:    She has no cervical adenopathy.  Neurological: She is alert and oriented to person, place, and time.  Skin: Skin is warm and dry. No rash noted.  Psychiatric: She has a normal mood and affect. Her behavior is normal.          Assessment & Plan:   Anxiety disorder.  Alprazolam refilled.  This was the primary reason for her visit.  She is scheduled for a physical later this fall Menopausal syndrome.  Patient will consider taper and discontinuation of hormone replacement therapy Insomnia Hair loss.  Follow-up with dermatology Cataracts.  Follow-up ophthalmology  Annual exam as scheduled  Nyoka Cowden, MD

## 2016-02-02 ENCOUNTER — Telehealth: Payer: Self-pay | Admitting: Internal Medicine

## 2016-02-02 NOTE — Telephone Encounter (Signed)
The directions for alprazolam should say 4x's a day instead of 3x's a day in order for her to get 120 pills at this time she is 30 pills short.

## 2016-02-03 NOTE — Telephone Encounter (Signed)
Dr. Raliegh Ip, please clarify directions for pt's Alprazolam, is she to take it 3 or 4 times a day?

## 2016-02-04 MED ORDER — ALPRAZOLAM 0.25 MG PO TABS: 0.2500 mg | ORAL_TABLET | Freq: Four times a day (QID) | ORAL | Status: DC

## 2016-02-04 NOTE — Telephone Encounter (Signed)
Okay to refill for #120 Inform  patient that she should try to down titrate her medication use to 3 times daily

## 2016-02-04 NOTE — Telephone Encounter (Signed)
When you look in history of meds the last time she took med 3 times a day was 05-23-13. Starting 07-14-13 until present she has been taking the Xanax 4 times a day and getting #120 at a time The last Rx you gave her was for #90 which leaves her 30 pills short.

## 2016-02-04 NOTE — Telephone Encounter (Signed)
Gave patient Dr. Instructions and phoned in Rx

## 2016-02-04 NOTE — Telephone Encounter (Signed)
Alprazolam 1 tablet every 6 hours as needed for anxiety;  maximum 90 per month

## 2016-02-14 DIAGNOSIS — H35372 Puckering of macula, left eye: Secondary | ICD-10-CM | POA: Diagnosis not present

## 2016-02-14 DIAGNOSIS — H2513 Age-related nuclear cataract, bilateral: Secondary | ICD-10-CM | POA: Insufficient documentation

## 2016-02-14 DIAGNOSIS — H43393 Other vitreous opacities, bilateral: Secondary | ICD-10-CM | POA: Insufficient documentation

## 2016-02-14 DIAGNOSIS — H2511 Age-related nuclear cataract, right eye: Secondary | ICD-10-CM | POA: Diagnosis not present

## 2016-02-14 DIAGNOSIS — H35341 Macular cyst, hole, or pseudohole, right eye: Secondary | ICD-10-CM | POA: Insufficient documentation

## 2016-02-14 DIAGNOSIS — Z888 Allergy status to other drugs, medicaments and biological substances status: Secondary | ICD-10-CM | POA: Diagnosis not present

## 2016-02-20 ENCOUNTER — Other Ambulatory Visit: Payer: Self-pay | Admitting: Internal Medicine

## 2016-03-24 ENCOUNTER — Telehealth: Payer: Self-pay | Admitting: Internal Medicine

## 2016-03-24 MED ORDER — ALPRAZOLAM 0.25 MG PO TABS: 0.2500 mg | ORAL_TABLET | Freq: Four times a day (QID) | ORAL | 0 refills | Status: DC

## 2016-03-24 NOTE — Telephone Encounter (Signed)
Spoke to pt, told her Rx was called into pharmacy for her enough until appt on 9/15 with Dr.K. Pt verbalized understanding. Told her she can discuss quantity with him when she comes for appt. Pt verbalized understanding.

## 2016-03-24 NOTE — Telephone Encounter (Signed)
Pt following up on request refill  ALPRAZolam (XANAX) 0.25 MG tablet  Pt has appt 9/15 for cpe, but last rx did not last long enough

## 2016-03-31 ENCOUNTER — Other Ambulatory Visit: Payer: 59

## 2016-04-06 DIAGNOSIS — H2511 Age-related nuclear cataract, right eye: Secondary | ICD-10-CM | POA: Diagnosis not present

## 2016-04-06 DIAGNOSIS — H2513 Age-related nuclear cataract, bilateral: Secondary | ICD-10-CM | POA: Diagnosis not present

## 2016-04-06 DIAGNOSIS — H35341 Macular cyst, hole, or pseudohole, right eye: Secondary | ICD-10-CM | POA: Diagnosis not present

## 2016-04-06 DIAGNOSIS — H35373 Puckering of macula, bilateral: Secondary | ICD-10-CM | POA: Diagnosis not present

## 2016-04-07 ENCOUNTER — Encounter: Payer: Self-pay | Admitting: Internal Medicine

## 2016-04-07 ENCOUNTER — Telehealth: Payer: Self-pay | Admitting: Internal Medicine

## 2016-04-07 ENCOUNTER — Ambulatory Visit (INDEPENDENT_AMBULATORY_CARE_PROVIDER_SITE_OTHER): Payer: Medicare Other | Admitting: Internal Medicine

## 2016-04-07 ENCOUNTER — Other Ambulatory Visit: Payer: Self-pay | Admitting: *Deleted

## 2016-04-07 VITALS — BP 120/68 | HR 104 | Temp 98.4°F | Resp 18 | Ht 62.0 in | Wt 110.0 lb

## 2016-04-07 DIAGNOSIS — F411 Generalized anxiety disorder: Secondary | ICD-10-CM

## 2016-04-07 DIAGNOSIS — E8941 Symptomatic postprocedural ovarian failure: Secondary | ICD-10-CM | POA: Diagnosis not present

## 2016-04-07 DIAGNOSIS — Z0000 Encounter for general adult medical examination without abnormal findings: Secondary | ICD-10-CM

## 2016-04-07 MED ORDER — ESTRADIOL 0.1 MG/GM VA CREA: 1.0000 | TOPICAL_CREAM | VAGINAL | 3 refills | Status: DC

## 2016-04-07 MED ORDER — ESTROGENS CONJUGATED 0.625 MG PO TABS: 0.6250 mg | ORAL_TABLET | Freq: Every day | ORAL | 4 refills | Status: DC

## 2016-04-07 MED ORDER — ALPRAZOLAM 0.25 MG PO TABS: 0.2500 mg | ORAL_TABLET | Freq: Four times a day (QID) | ORAL | 2 refills | Status: DC

## 2016-04-07 NOTE — Progress Notes (Signed)
Patient ID: Elizabeth Ward, female   DOB: 1946-06-03, 71 y.o.   MRN: [id number]  Subjective:    Patient ID: Elizabeth Ward, female    DOB: 05-15-1946, 70 y.o.   MRN: [id number]  HPI  Chief Complaint: To Establish. Annual exam  History of Present Illness:   70  year-old patient is seen today for an annual exam.  Medical problems include anxiety IBS insomnia. She is also had a surgical menopause in 1992 due to endometriosis. She has been on hormone replacement therapy.She has been adjusting to the death of her son who died in the 03/08/2012.  She is primary caregiver for her grandson   She has been on hormone replacement therapy since her surgical menopause   Current Allergies:  ! COMPAZINE   Past Medical History:   surgical menopause  history of endometriosis  Anxiety  insomnia  neck pain  IBS   Past Surgical History:   Colectomy partial 1992 for endometrioma  Hysterectomy history of endometriosis 1983  Tonsillectomy 1954  Colonoscopy in 1995   Family History:   father died age 76, lung cancer  mother died age 24 complications of Hodgkin's disease  No siblings   Social History:   Married  Never Smoked  one son  died 03-08-2012  Risk Factors:  Tobacco use: never     Review of Systems  Constitutional: Negative for appetite change, fatigue, fever and unexpected weight change.  HENT: Negative for congestion, dental problem, ear pain, hearing loss, mouth sores, nosebleeds, sinus pressure, sore throat, tinnitus, trouble swallowing and voice change.   Eyes: Negative for photophobia, pain, redness and visual disturbance.  Respiratory: Negative for cough, chest tightness and shortness of breath.   Cardiovascular: Negative for chest pain, palpitations and leg swelling.  Gastrointestinal: Negative for abdominal distention, abdominal pain, blood in stool, constipation, diarrhea, nausea, rectal pain and vomiting.  Genitourinary: Negative for difficulty urinating, dysuria,  flank pain, frequency, genital sores, hematuria, menstrual problem, pelvic pain, urgency, vaginal bleeding, vaginal discharge and vaginal pain.  Musculoskeletal: Positive for arthralgias. Negative for back pain and neck stiffness.  Skin: Negative for rash.  Neurological: Negative for dizziness, syncope, speech difficulty, weakness, light-headedness, numbness and headaches.  Hematological: Negative for adenopathy. Does not bruise/bleed easily.  Psychiatric/Behavioral: Positive for decreased concentration, dysphoric mood and sleep disturbance. Negative for agitation, behavioral problems, self-injury and suicidal ideas. The patient is nervous/anxious.   . And pelvis, same dose.  Recheck in 4 weeks     Objective:   Physical Exam  Constitutional: She is oriented to person, place, and time. She appears well-developed and well-nourished.  HENT:  Head: Normocephalic and atraumatic.  Right Ear: External ear normal.  Left Ear: External ear normal.  Mouth/Throat: Oropharynx is clear and moist.  Eyes: Conjunctivae and EOM are normal.  Neck: Normal range of motion. Neck supple. No JVD present. No thyromegaly present.  Cardiovascular: Normal rate, regular rhythm, normal heart sounds and intact distal pulses.   No murmur heard. Pulmonary/Chest: Effort normal and breath sounds normal. She has no wheezes. She has no rales.  Abdominal: Soft. Bowel sounds are normal. She exhibits no distension and no mass. There is no tenderness. There is no rebound and no guarding.  Musculoskeletal: Normal range of motion. She exhibits no edema or tenderness.  Neurological: She is alert and oriented to person, place, and time. She has normal reflexes. No cranial nerve deficit. She exhibits normal muscle tone. Coordination normal.  Skin: Skin is warm and dry.  No rash noted.  Psychiatric: She has a normal mood and affect. Her behavior is normal.   1. Risk factors, based on past  M,S,F history- no significant cardiovascular  risk factors  2.  Physical activities: No regimented exercise program but remains active. At ideal body weight  3.  Depression/mood: History of reactive depression since the death of her son in Mar 08, 2023 of last year  4.  Hearing: No deficits  5.  ADL's: Independent in aspects of daily living  6.  Fall risk: Low  7.  Home safety: No problems identified  8.  Height weight, and visual acuity; height and weight stable no change in visual acuity  9.  Counseling: Behavioral health counseling recommended. Regular exercise regimen encouraged  10. Lab orders based on risk factors: Laboratory profile reviewed  11. Referral : To receive counseling at church. Followup colonoscopy encouraged as well as mammogram  12. Care plan: As above  13. Cognitive assessment: Alert and oriented normal affect no cognitive dysfunction  14.  Preventive services will include annual health examination was screening lab.  The patient will consider a follow-up bone density in one or 2 years.  Follow colonoscopy strongly encouraged  Patient was provided with a written and personalized care plan  15.  Provider list includes primary care medicine, gastroenterology and ophthalmology          Assessment & Plan:   Preventive health examination. Laboratory studies reviewed and discussed with patient screening colonoscopy also encouraged. She will consider this Anxiety disorder. Alprazolam refilled counseling recommended.  Down titrate Premarin to 0.65 milligrams daily Will  continue Prozac to 20 mg daily  Recheck 6 months    Nyoka Cowden

## 2016-04-07 NOTE — Patient Instructions (Addendum)
Schedule your colonoscopy to help detect colon cancer.    It is important that you exercise regularly, at least 20 minutes 3 to 4 times per week.  If you develop chest pain or shortness of breath seek  medical attention.  Take a calcium supplement, plus [phone] units of vitamin D  Menopause is a normal process in which your reproductive ability comes to an end. This process happens gradually over a span of months to years, usually between the ages of 68 and 75. Menopause is complete when you have missed 12 consecutive menstrual periods. It is important to talk with your health care provider about some of the most common conditions that affect postmenopausal women, such as heart disease, cancer, and bone loss (osteoporosis). Adopting a healthy lifestyle and getting preventive care can help to promote your health and wellness. Those actions can also lower your chances of developing some of these common conditions. WHAT SHOULD I KNOW ABOUT MENOPAUSE? During menopause, you may experience a number of symptoms, such as:  Moderate-to-severe hot flashes.  Night sweats.  Decrease in sex drive.  Mood swings.  Headaches.  Tiredness.  Irritability.  Memory problems.  Insomnia. Choosing to treat or not to treat menopausal changes is an individual decision that you make with your health care provider. WHAT SHOULD I KNOW ABOUT HORMONE REPLACEMENT THERAPY AND SUPPLEMENTS? Hormone therapy products are effective for treating symptoms that are associated with menopause, such as hot flashes and night sweats. Hormone replacement carries certain risks, especially as you become older. If you are thinking about using estrogen or estrogen with progestin treatments, discuss the benefits and risks with your health care provider. WHAT SHOULD I KNOW ABOUT HEART DISEASE AND STROKE? Heart disease, heart attack, and stroke become more likely as you age. This may be due, in part, to the hormonal changes that your  body experiences during menopause. These can affect how your body processes dietary fats, triglycerides, and cholesterol. Heart attack and stroke are both medical emergencies. There are many things that you can do to help prevent heart disease and stroke:  Have your blood pressure checked at least every 1-2 years. High blood pressure causes heart disease and increases the risk of stroke.  If you are 68-31 years old, ask your health care provider if you should take aspirin to prevent a heart attack or a stroke.  Do not use any tobacco products, including cigarettes, chewing tobacco, or electronic cigarettes. If you need help quitting, ask your health care provider.  It is important to eat a healthy diet and maintain a healthy weight.  Be sure to include plenty of vegetables, fruits, low-fat dairy products, and lean protein.  Avoid eating foods that are high in solid fats, added sugars, or salt (sodium).  Get regular exercise. This is one of the most important things that you can do for your health.  Try to exercise for at least 150 minutes each week. The type of exercise that you do should increase your heart rate and make you sweat. This is known as moderate-intensity exercise.  Try to do strengthening exercises at least twice each week. Do these in addition to the moderate-intensity exercise.  Know your numbers.Ask your health care provider to check your cholesterol and your blood glucose. Continue to have your blood tested as directed by your health care provider. WHAT SHOULD I KNOW ABOUT CANCER SCREENING? There are several types of cancer. Take the following steps to reduce your risk and to catch any cancer development  as early as possible. Breast Cancer  Practice breast self-awareness.  This means understanding how your breasts normally appear and feel.  It also means doing regular breast self-exams. Let your health care provider know about any changes, no matter how small.  If  you are 68 or older, have a clinician do a breast exam (clinical breast exam or CBE) every year. Depending on your age, family history, and medical history, it may be recommended that you also have a yearly breast X-ray (mammogram).  If you have a family history of breast cancer, talk with your health care provider about genetic screening.  If you are at high risk for breast cancer, talk with your health care provider about having an MRI and a mammogram every year.  Breast cancer (BRCA) gene test is recommended for women who have family members with BRCA-related cancers. Results of the assessment will determine the need for genetic counseling and BRCA1 and for BRCA2 testing. BRCA-related cancers include these types:  Breast. This occurs in males or females.  Ovarian.  Tubal. This may also be called fallopian tube cancer.  Cancer of the abdominal or pelvic lining (peritoneal cancer).  Prostate.  Pancreatic. Cervical, Uterine, and Ovarian Cancer Your health care provider may recommend that you be screened regularly for cancer of the pelvic organs. These include your ovaries, uterus, and vagina. This screening involves a pelvic exam, which includes checking for microscopic changes to the surface of your cervix (Pap test).  For women ages 21-65, health care providers may recommend a pelvic exam and a Pap test every three years. For women ages 31-65, they may recommend the Pap test and pelvic exam, combined with testing for human papilloma virus (HPV), every five years. Some types of HPV increase your risk of cervical cancer. Testing for HPV may also be done on women of any age who have unclear Pap test results.  Other health care providers may not recommend any screening for nonpregnant women who are considered low risk for pelvic cancer and have no symptoms. Ask your health care provider if a screening pelvic exam is right for you.  If you have had past treatment for cervical cancer or a  condition that could lead to cancer, you need Pap tests and screening for cancer for at least 20 years after your treatment. If Pap tests have been discontinued for you, your risk factors (such as having a new sexual partner) need to be reassessed to determine if you should start having screenings again. Some women have medical problems that increase the chance of getting cervical cancer. In these cases, your health care provider may recommend that you have screening and Pap tests more often.  If you have a family history of uterine cancer or ovarian cancer, talk with your health care provider about genetic screening.  If you have vaginal bleeding after reaching menopause, tell your health care provider.  There are currently no reliable tests available to screen for ovarian cancer. Lung Cancer Lung cancer screening is recommended for adults 14-51 years old who are at high risk for lung cancer because of a history of smoking. A yearly low-dose CT scan of the lungs is recommended if you:  Currently smoke.  Have a history of at least 30 pack-years of smoking and you currently smoke or have quit within the past 15 years. A pack-year is smoking an average of one pack of cigarettes per day for one year. Yearly screening should:  Continue until it has been 15 years  since you quit.  Stop if you develop a health problem that would prevent you from having lung cancer treatment. Colorectal Cancer  This type of cancer can be detected and can often be prevented.  Routine colorectal cancer screening usually begins at age 32 and continues through age 38.  If you have risk factors for colon cancer, your health care provider may recommend that you be screened at an earlier age.  If you have a family history of colorectal cancer, talk with your health care provider about genetic screening.  Your health care provider may also recommend using home test kits to check for hidden blood in your stool.  A small  camera at the end of a tube can be used to examine your colon directly (sigmoidoscopy or colonoscopy). This is done to check for the earliest forms of colorectal cancer.  Direct examination of the colon should be repeated every 5-10 years until age 80. However, if early forms of precancerous polyps or small growths are found or if you have a family history or genetic risk for colorectal cancer, you may need to be screened more often. Skin Cancer  Check your skin from head to toe regularly.  Monitor any moles. Be sure to tell your health care provider:  About any new moles or changes in moles, especially if there is a change in a mole's shape or color.  If you have a mole that is larger than the size of a pencil eraser.  If any of your family members has a history of skin cancer, especially at a young age, talk with your health care provider about genetic screening.  Always use sunscreen. Apply sunscreen liberally and repeatedly throughout the day.  Whenever you are outside, protect yourself by wearing long sleeves, pants, a wide-brimmed hat, and sunglasses. WHAT SHOULD I KNOW ABOUT OSTEOPOROSIS? Osteoporosis is a condition in which bone destruction happens more quickly than new bone creation. After menopause, you may be at an increased risk for osteoporosis. To help prevent osteoporosis or the bone fractures that can happen because of osteoporosis, the following is recommended:  If you are 34-77 years old, get at least 1,000 mg of calcium and at least 600 mg of vitamin D per day.  If you are older than age 77 but younger than age 15, get at least 1,200 mg of calcium and at least 600 mg of vitamin D per day.  If you are older than age 78, get at least 1,200 mg of calcium and at least 800 mg of vitamin D per day. Smoking and excessive alcohol intake increase the risk of osteoporosis. Eat foods that are rich in calcium and vitamin D, and do weight-bearing exercises several times each week as  directed by your health care provider. WHAT SHOULD I KNOW ABOUT HOW MENOPAUSE AFFECTS [place]? Depression may occur at any age, but it is more common as you become older. Common symptoms of depression include:  Low or sad mood.  Changes in sleep patterns.  Changes in appetite or eating patterns.  Feeling an overall lack of motivation or enjoyment of activities that you previously enjoyed.  Frequent crying spells. Talk with your health care provider if you think that you are experiencing depression. WHAT SHOULD I KNOW ABOUT IMMUNIZATIONS? It is important that you get and maintain your immunizations. These include:  Tetanus, diphtheria, and pertussis (Tdap) booster vaccine.  Influenza every year before the flu season begins.  Pneumonia vaccine.  Shingles vaccine. Your health care  provider may also recommend other immunizations.   This information is not intended to replace advice given to you by your health care provider. Make sure you discuss any questions you have with your health care provider.   Document Released: 09/01/2005 Document Revised: 07/31/2014 Document Reviewed: 03/12/2014 Elsevier Interactive Patient Education [employer].

## 2016-04-07 NOTE — Progress Notes (Signed)
Pre visit review using our clinic review tool, if applicable. No additional management support is needed unless otherwise documented below in the visit note. 

## 2016-04-07 NOTE — Telephone Encounter (Signed)
Error/ltd ° °

## 2016-04-12 DIAGNOSIS — H35371 Puckering of macula, right eye: Secondary | ICD-10-CM | POA: Diagnosis not present

## 2016-04-12 DIAGNOSIS — H33101 Unspecified retinoschisis, right eye: Secondary | ICD-10-CM | POA: Diagnosis not present

## 2016-04-12 DIAGNOSIS — H2511 Age-related nuclear cataract, right eye: Secondary | ICD-10-CM | POA: Diagnosis not present

## 2016-04-12 DIAGNOSIS — H35341 Macular cyst, hole, or pseudohole, right eye: Secondary | ICD-10-CM | POA: Diagnosis not present

## 2016-05-15 DIAGNOSIS — H2511 Age-related nuclear cataract, right eye: Secondary | ICD-10-CM | POA: Diagnosis not present

## 2016-05-15 DIAGNOSIS — H33101 Unspecified retinoschisis, right eye: Secondary | ICD-10-CM | POA: Diagnosis not present

## 2016-05-18 ENCOUNTER — Other Ambulatory Visit: Payer: Self-pay | Admitting: Internal Medicine

## 2016-05-19 DIAGNOSIS — L814 Other melanin hyperpigmentation: Secondary | ICD-10-CM | POA: Diagnosis not present

## 2016-05-19 DIAGNOSIS — L570 Actinic keratosis: Secondary | ICD-10-CM | POA: Diagnosis not present

## 2016-05-19 DIAGNOSIS — D1801 Hemangioma of skin and subcutaneous tissue: Secondary | ICD-10-CM | POA: Diagnosis not present

## 2016-05-19 DIAGNOSIS — L720 Epidermal cyst: Secondary | ICD-10-CM | POA: Diagnosis not present

## 2016-05-19 DIAGNOSIS — I788 Other diseases of capillaries: Secondary | ICD-10-CM | POA: Diagnosis not present

## 2016-05-19 DIAGNOSIS — L821 Other seborrheic keratosis: Secondary | ICD-10-CM | POA: Diagnosis not present

## 2016-06-01 ENCOUNTER — Telehealth: Payer: Self-pay | Admitting: Internal Medicine

## 2016-06-01 MED ORDER — ALPRAZOLAM 0.25 MG PO TABS: 0.2500 mg | ORAL_TABLET | Freq: Four times a day (QID) | ORAL | 3 refills | Status: DC

## 2016-06-01 NOTE — Telephone Encounter (Signed)
Pt notified Rx for Alprazolam was called into pharmacy and I called in 120 tablets. Pt verbalized understanding.

## 2016-06-01 NOTE — Telephone Encounter (Signed)
Pt request refill  ALPRAZolam (XANAX) 0.25 MG tablet  120 tabs  Pt states it was called in wrong last time and it was only a 2 week supply at a time. Pt prefers to get a month supply.  Pt will be out tomorrow. [facility] [address]

## 2016-06-07 DIAGNOSIS — H43821 Vitreomacular adhesion, right eye: Secondary | ICD-10-CM | POA: Diagnosis not present

## 2016-06-07 DIAGNOSIS — H35371 Puckering of macula, right eye: Secondary | ICD-10-CM | POA: Diagnosis not present

## 2016-06-14 DIAGNOSIS — H33101 Unspecified retinoschisis, right eye: Secondary | ICD-10-CM | POA: Diagnosis not present

## 2016-06-14 DIAGNOSIS — H35341 Macular cyst, hole, or pseudohole, right eye: Secondary | ICD-10-CM | POA: Diagnosis not present

## 2016-06-14 DIAGNOSIS — Z09 Encounter for follow-up examination after completed treatment for conditions other than malignant neoplasm: Secondary | ICD-10-CM | POA: Diagnosis not present

## 2016-06-14 DIAGNOSIS — H35371 Puckering of macula, right eye: Secondary | ICD-10-CM | POA: Diagnosis not present

## 2016-07-26 DIAGNOSIS — Z09 Encounter for follow-up examination after completed treatment for conditions other than malignant neoplasm: Secondary | ICD-10-CM | POA: Diagnosis not present

## 2016-07-26 DIAGNOSIS — H35371 Puckering of macula, right eye: Secondary | ICD-10-CM | POA: Diagnosis not present

## 2016-07-26 DIAGNOSIS — H33101 Unspecified retinoschisis, right eye: Secondary | ICD-10-CM | POA: Diagnosis not present

## 2016-08-17 ENCOUNTER — Other Ambulatory Visit: Payer: Self-pay | Admitting: Internal Medicine

## 2016-09-25 ENCOUNTER — Other Ambulatory Visit: Payer: Self-pay | Admitting: Internal Medicine

## 2016-10-11 DIAGNOSIS — H0012 Chalazion right lower eyelid: Secondary | ICD-10-CM | POA: Diagnosis not present

## 2016-10-19 DIAGNOSIS — H2512 Age-related nuclear cataract, left eye: Secondary | ICD-10-CM | POA: Diagnosis not present

## 2016-10-19 DIAGNOSIS — H0012 Chalazion right lower eyelid: Secondary | ICD-10-CM | POA: Diagnosis not present

## 2016-10-19 DIAGNOSIS — Z961 Presence of intraocular lens: Secondary | ICD-10-CM | POA: Diagnosis not present

## 2016-10-31 ENCOUNTER — Other Ambulatory Visit: Payer: Self-pay | Admitting: Internal Medicine

## 2016-11-28 ENCOUNTER — Other Ambulatory Visit: Payer: Self-pay | Admitting: Internal Medicine

## 2016-11-29 ENCOUNTER — Telehealth: Payer: Self-pay | Admitting: Internal Medicine

## 2016-11-29 NOTE — Telephone Encounter (Signed)
Pt need new Rx for Alprazolam  Pharm:  [facility] [address]  Pt was very rude wanted to know why her medication was not sent in and every time that I was trying to explain to her she was over talking me and she stated that I was over sensitive and I need to get over it and get her medication called in.  Pt is aware that Dr. Raliegh Ip is out of the office and will be returning on Friday 12/01/16 and state she wanted it before then b/c she is out the medication.

## 2016-12-01 NOTE — Telephone Encounter (Signed)
Patient is aware that doctor K is reviewing her Rx.

## 2016-12-01 NOTE — Telephone Encounter (Signed)
Patient is aware and stated that she will call in to schedule an appointment.

## 2016-12-01 NOTE — Telephone Encounter (Signed)
Rx was phoned in and patient is aware.

## 2016-12-01 NOTE — Telephone Encounter (Signed)
Rx called in to patient pharmacy #[id number]

## 2016-12-01 NOTE — Telephone Encounter (Signed)
Rx has been sent to the provider for approval awaiting signiture.

## 2016-12-01 NOTE — Telephone Encounter (Signed)
Pt calling to check the status of the Rx alprazolam  [facility] [address]  Stating that she is out of this medication.  Pt would like to have a call back.

## 2016-12-01 NOTE — Telephone Encounter (Signed)
Okay refill #90 Return office visit prior to further refills

## 2016-12-20 ENCOUNTER — Telehealth: Payer: Self-pay | Admitting: Internal Medicine

## 2016-12-20 NOTE — Telephone Encounter (Signed)
Pt was last seen in sept 2017. Pt would like a refill on alprazolam. Dr Raliegh Ip does not have any available slots until Monday 6-11. Pt is requesting to talk with nurse

## 2016-12-21 NOTE — Telephone Encounter (Signed)
Last OV: 04/07/16  Last refilled: 09/26/16 #120 tabs with 1 refill    Pt states that she will run out of medication next Friday (12/29/16)  Appointment made for 01/04/17 @ 3:45pm. Ok to refill Xanax? Please advise

## 2016-12-22 MED ORDER — ALPRAZOLAM 0.25 MG PO TABS: 0.2500 mg | ORAL_TABLET | Freq: Four times a day (QID) | ORAL | 0 refills | Status: DC

## 2016-12-22 NOTE — Telephone Encounter (Signed)
Pt following up on refill request

## 2016-12-22 NOTE — Telephone Encounter (Signed)
Okay to refill? 

## 2016-12-22 NOTE — Telephone Encounter (Signed)
Rx called in and patient is aware 

## 2017-01-01 ENCOUNTER — Other Ambulatory Visit: Payer: Self-pay | Admitting: *Deleted

## 2017-01-01 MED ORDER — ESTRADIOL 0.1 MG/GM VA CREA: 1.0000 | TOPICAL_CREAM | VAGINAL | 0 refills | Status: DC

## 2017-01-01 NOTE — Telephone Encounter (Signed)
Rx done. 

## 2017-01-04 ENCOUNTER — Ambulatory Visit (INDEPENDENT_AMBULATORY_CARE_PROVIDER_SITE_OTHER): Payer: Medicare Other | Admitting: Internal Medicine

## 2017-01-04 ENCOUNTER — Encounter: Payer: Self-pay | Admitting: Internal Medicine

## 2017-01-04 VITALS — BP 118/72 | HR 77 | Temp 97.9°F | Ht 62.0 in | Wt 110.2 lb

## 2017-01-04 DIAGNOSIS — F5101 Primary insomnia: Secondary | ICD-10-CM

## 2017-01-04 DIAGNOSIS — F411 Generalized anxiety disorder: Secondary | ICD-10-CM

## 2017-01-04 MED ORDER — TRAZODONE HCL 50 MG PO TABS: 25.0000 mg | ORAL_TABLET | Freq: Every evening | ORAL | 1 refills | Status: DC | PRN

## 2017-01-04 NOTE — Patient Instructions (Addendum)
WE NOW OFFER   Elizabeth Ward's FAST TRACK!!!  SAME DAY Appointments for ACUTE CARE  Such as: Sprains, Injuries, cuts, abrasions, rashes, muscle pain, joint pain, back pain Colds, flu, sore throats, headache, allergies, cough, fever  Ear pain, sinus and eye infections Abdominal pain, nausea, vomiting, diarrhea, upset stomach Animal/insect bites  3 Easy Ways to Schedule: Walk-In Scheduling Call in scheduling Mychart Sign-up: [email or web address]     It is important that you exercise regularly, at least 20 minutes 3 to 4 times per week.  If you develop chest pain or shortness of breath seek  medical attention.  Return in 6 months for follow-up

## 2017-01-04 NOTE — Progress Notes (Signed)
Subjective:    Patient ID: Elizabeth Ward, female    DOB: Jun 17, 1946, 71 y.o.   MRN: [id number]  HPI 71 year old patient who is seen today for her six-month follow-up.  She has a history of generalized anxiety disorder and remains on alprazolam.  She uses 3-4 tablets daily, mainly to help her sleep She also uses trazodone. She remains on fluoxetine. At the time of her last annual exam.  Premarin was down titrated to 0.625 milligrams daily, which he tolerates well.  She is very reluctant to consider further down titration In general doing quite well Still primary caregiver for a 91-year-old grandson  Past Medical History:  Diagnosis Date  . ANXIETY 08/14/2008  . INSOMNIA 09/07/2009  . Irritable bowel syndrome 09/07/2009  . MENOPAUSE, SURGICAL 08/14/2008  . UNSPECIFIED VAGINITIS AND VULVOVAGINITIS 06/04/2010     Social History   Social History  . Marital status: Married    Spouse name: N/A  . Number of children: N/A  . Years of education: N/A   Occupational History  . Not on file.   Social History Main Topics  . Smoking status: Never Smoker  . Smokeless tobacco: Never Used  . Alcohol use Yes  . Drug use: No  . Sexual activity: Not on file   Other Topics Concern  . Not on file   Social History Narrative  . No narrative on file    Past Surgical History:  Procedure Laterality Date  . ABDOMINAL HYSTERECTOMY    . COLECTOMY     partial  . TONSILLECTOMY    . TONSILLECTOMY      No family history on file.  Allergies  Allergen Reactions  . Prochlorperazine Edisylate Other (See Comments)    Tongue spasms(tongue would droop back in to the throat   . Tape Rash    Current Outpatient Prescriptions on File Prior to Visit  Medication Sig Dispense Refill  . ALPRAZolam (XANAX) 0.25 MG tablet Take 1 tablet (0.25 mg total) by mouth 4 (four) times daily. 120 tablet 0  . estradiol (ESTRACE) 0.1 MG/GM vaginal cream Place 1 Applicatorful vaginally 2 (two) times a week. 127.5 g 0    . estrogens, conjugated, (PREMARIN) 0.625 MG tablet Take 1 tablet (0.625 mg total) by mouth daily. Take daily for 21 days then do not take for 7 days. 90 tablet 4  . FLUoxetine (PROZAC) 20 MG tablet TAKE 1 TABLET (20 MG TOTAL) BY MOUTH DAILY. 90 tablet 1  . methocarbamol (ROBAXIN) 500 MG tablet Take 1 tablet (500 mg total) by mouth 4 (four) times daily. 60 tablet 3  . traZODone (DESYREL) 50 MG tablet TAKE 1/2 TABLET BY MOUTH AT BEDTIME AS NEEDED 45 tablet 1   No current facility-administered medications on file prior to visit.     BP 118/72 (BP Location: Left Arm, Patient Position: Sitting, Cuff Size: Normal)   Pulse 77   Temp 97.9 F (36.6 C) (Oral)   Ht 5\' 2"  (1.575 m)   Wt 110 lb 3.2 oz (50 kg)   SpO2 99%   BMI 20.16 kg/m      Review of Systems  Constitutional: Negative.   HENT: Negative for congestion, dental problem, hearing loss, rhinorrhea, sinus pressure, sore throat and tinnitus.   Eyes: Negative for pain, discharge and visual disturbance.  Respiratory: Negative for cough and shortness of breath.   Cardiovascular: Negative for chest pain, palpitations and leg swelling.  Gastrointestinal: Negative for abdominal distention, abdominal pain, blood in stool, constipation, diarrhea, nausea and  vomiting.  Genitourinary: Negative for difficulty urinating, dysuria, flank pain, frequency, hematuria, pelvic pain, urgency, vaginal bleeding, vaginal discharge and vaginal pain.  Musculoskeletal: Negative for arthralgias, gait problem and joint swelling.  Skin: Negative for rash.  Neurological: Negative for dizziness, syncope, speech difficulty, weakness, numbness and headaches.  Hematological: Negative for adenopathy.  Psychiatric/Behavioral: Positive for sleep disturbance. Negative for agitation, behavioral problems and dysphoric mood. The patient is nervous/anxious.        Objective:   Physical Exam  Constitutional: She is oriented to person, place, and time. She appears  well-developed and well-nourished.  HENT:  Head: Normocephalic.  Right Ear: External ear normal.  Left Ear: External ear normal.  Mouth/Throat: Oropharynx is clear and moist.  Eyes: Conjunctivae and EOM are normal. Pupils are equal, round, and reactive to light.  Neck: Normal range of motion. Neck supple. No thyromegaly present.  Cardiovascular: Normal rate, regular rhythm, normal heart sounds and intact distal pulses.   Pulmonary/Chest: Effort normal and breath sounds normal.  Abdominal: Soft. Bowel sounds are normal. She exhibits no mass. There is no tenderness.  Musculoskeletal: Normal range of motion.  Lymphadenopathy:    She has no cervical adenopathy.  Neurological: She is alert and oriented to person, place, and time.  Skin: Skin is warm and dry. No rash noted.  Psychiatric: She has a normal mood and affect. Her behavior is normal.          Assessment & Plan:   Generalized anxiety disorder.  Alprazolam refilled earlier this month Insomnia.  Trazodone refilled Surgical menopause.  Patient reluctant to consider further dose titration of her hormone replacement therapy.  Will reassess at the time of her annual exam  Nyoka Cowden

## 2017-01-17 DIAGNOSIS — H43392 Other vitreous opacities, left eye: Secondary | ICD-10-CM | POA: Diagnosis not present

## 2017-01-17 DIAGNOSIS — H35341 Macular cyst, hole, or pseudohole, right eye: Secondary | ICD-10-CM | POA: Diagnosis not present

## 2017-01-17 DIAGNOSIS — H33101 Unspecified retinoschisis, right eye: Secondary | ICD-10-CM | POA: Diagnosis not present

## 2017-01-17 DIAGNOSIS — H35371 Puckering of macula, right eye: Secondary | ICD-10-CM | POA: Diagnosis not present

## 2017-01-25 ENCOUNTER — Other Ambulatory Visit: Payer: Self-pay | Admitting: Internal Medicine

## 2017-01-26 ENCOUNTER — Other Ambulatory Visit: Payer: Self-pay

## 2017-01-26 ENCOUNTER — Telehealth: Payer: Self-pay

## 2017-01-26 NOTE — Telephone Encounter (Signed)
Pt Rx was signed and phoned into [facility], pt is aware.

## 2017-01-26 NOTE — Telephone Encounter (Signed)
Pt notified that the Rx has been faxed to the pharmacy

## 2017-02-15 ENCOUNTER — Other Ambulatory Visit: Payer: Self-pay | Admitting: Internal Medicine

## 2017-03-01 ENCOUNTER — Other Ambulatory Visit: Payer: Self-pay | Admitting: Internal Medicine

## 2017-04-02 ENCOUNTER — Other Ambulatory Visit: Payer: Self-pay | Admitting: Internal Medicine

## 2017-04-03 DIAGNOSIS — Z961 Presence of intraocular lens: Secondary | ICD-10-CM | POA: Diagnosis not present

## 2017-04-03 DIAGNOSIS — H2512 Age-related nuclear cataract, left eye: Secondary | ICD-10-CM | POA: Diagnosis not present

## 2017-04-03 DIAGNOSIS — H35341 Macular cyst, hole, or pseudohole, right eye: Secondary | ICD-10-CM | POA: Diagnosis not present

## 2017-04-12 ENCOUNTER — Encounter: Payer: Self-pay | Admitting: Internal Medicine

## 2017-05-04 ENCOUNTER — Other Ambulatory Visit: Payer: Self-pay | Admitting: Internal Medicine

## 2017-05-07 ENCOUNTER — Other Ambulatory Visit: Payer: Self-pay | Admitting: Internal Medicine

## 2017-05-08 NOTE — Telephone Encounter (Signed)
Okay for refill?  

## 2017-05-08 NOTE — Telephone Encounter (Signed)
Rx last filled 9.13.18 Last OV 6.14.18.  Please advise on refill

## 2017-05-09 ENCOUNTER — Other Ambulatory Visit: Payer: Self-pay | Admitting: Internal Medicine

## 2017-06-07 ENCOUNTER — Other Ambulatory Visit: Payer: Self-pay | Admitting: Internal Medicine

## 2017-06-11 ENCOUNTER — Other Ambulatory Visit: Payer: Self-pay | Admitting: Internal Medicine

## 2017-06-11 ENCOUNTER — Telehealth: Payer: Self-pay | Admitting: Internal Medicine

## 2017-06-11 MED ORDER — ALPRAZOLAM 0.25 MG PO TABS: 0.2500 mg | ORAL_TABLET | Freq: Four times a day (QID) | ORAL | 0 refills | Status: DC

## 2017-06-11 NOTE — Telephone Encounter (Signed)
Copied from [place] [id number]. Topic: Quick Communication - See Telephone Encounter >> Jun 11, 2017  9:57 AM Arletha Grippe wrote: CRM for notification. See Telephone encounter for:   06/11/17. Pt needs refill on alprazolam , the pharmacy has faxed and not heard back.  Please use [facility] [address]  Pt cb is [phone]

## 2017-06-11 NOTE — Telephone Encounter (Signed)
Medication needs provider approval. Thanks. 

## 2017-06-11 NOTE — Telephone Encounter (Signed)
rx was faxed to the pharmacy.

## 2017-06-11 NOTE — Telephone Encounter (Signed)
Rx printed awaiting to be signed by MD. 

## 2017-06-21 ENCOUNTER — Encounter: Payer: Self-pay | Admitting: Internal Medicine

## 2017-06-21 ENCOUNTER — Ambulatory Visit (INDEPENDENT_AMBULATORY_CARE_PROVIDER_SITE_OTHER): Payer: Medicare Other | Admitting: Internal Medicine

## 2017-06-21 VITALS — BP 116/66 | HR 64 | Temp 97.6°F | Ht 62.0 in | Wt 114.0 lb

## 2017-06-21 DIAGNOSIS — N8110 Cystocele, unspecified: Secondary | ICD-10-CM

## 2017-06-21 NOTE — Patient Instructions (Addendum)
Gynecologic evaluation as discussed   Pelvic Organ Prolapse Pelvic organ prolapse is the stretching, bulging, or dropping of pelvic organs into an abnormal position. It happens when the muscles and tissues that surround and support pelvic structures are stretched or weak. Pelvic organ prolapse can involve:  Vagina (vaginal prolapse).  Uterus (uterine prolapse).  Bladder (cystocele).  Rectum (rectocele).  Intestines (enterocele).  When organs other than the vagina are involved, they often bulge into the vagina or protrude from the vagina, depending on how severe the prolapse is. What are the causes? Causes of this condition include:  Pregnancy, labor, and childbirth.  Long-lasting (chronic) cough.  Chronic constipation.  Obesity.  Past pelvic surgery.  Aging. During and after menopause, a decreased production of the hormone estrogen can weaken pelvic ligaments and muscles.  Consistently lifting more than 50 lb (23 kg).  Buildup of fluid in the abdomen due to certain diseases and other conditions.  What are the signs or symptoms? Symptoms of this condition include:  Loss of bladder control when you cough, sneeze, strain, and exercise (stress incontinence). This may be worse immediately following childbirth, and it may gradually improve over time.  Feeling pressure in your pelvis or vagina. This pressure may increase when you cough or when you are having a bowel movement.  A bulge that protrudes from the opening of your vagina or against your vaginal wall. If your uterus protrudes through the opening of your vagina and rubs against your clothing, you may also experience soreness, ulcers, infection, pain, and bleeding.  Increased effort to have a bowel movement or urinate.  Pain in your low back.  Pain, discomfort, or disinterest in sexual intercourse.  Repeated bladder infections (urinary tract infections).  Difficulty inserting or inability to insert a tampon or  applicator.  In some people, this condition does not cause any symptoms. How is this diagnosed? Your health care provider may perform an internal and external vaginal and rectal exam. During the exam, you may be asked to cough and strain while you are lying down, sitting, and standing up. Your health care provider will determine if other tests are required, such as bladder function tests. How is this treated? In most cases, this condition needs to be treated only if it produces symptoms. No treatment is guaranteed to correct the prolapse or relieve the symptoms completely. Treatment may include:  Lifestyle changes, such as: ? Avoiding drinking beverages that contain caffeine. ? Increasing your intake of high-fiber foods. This can help to decrease constipation and straining during bowel movements. ? Emptying your bladder at scheduled times (bladder training therapy). This can help to reduce or avoid urinary incontinence. ? Losing weight if you are overweight or obese.  Estrogen. Estrogen may help mild prolapse by increasing the strength and tone of pelvic floor muscles.  Kegel exercises. These may help mild cases of prolapse by strengthening and tightening the muscles of the pelvic floor.  Pessary insertion. A pessary is a soft, flexible device that is placed into your vagina by your health care provider to help support the vaginal walls and keep pelvic organs in place.  Surgery. This is often the only form of treatment for severe prolapse. Different types of surgeries are available.  Follow these instructions at home:  Wear a sanitary pad or absorbent product if you have urinary incontinence.  Avoid heavy lifting and straining with exercise and work. Do not hold your breath when you perform mild to moderate lifting and exercise activities. Limit your activities  as directed by your health care provider.  Take medicines only as directed by your health care provider.  Perform Kegel  exercises as directed by your health care provider.  If you have a pessary, take care of it as directed by your health care provider. Contact a health care provider if:  Your symptoms interfere with your daily activities or sex life.  You need medicine to help with the discomfort.  You notice bleeding from the vagina that is not related to your period.  You have a fever.  You have pain or bleeding when you urinate.  You have bleeding when you have a bowel movement.  You lose urine when you have sex.  You have chronic constipation.  You have a pessary that falls out.  You have vaginal discharge that has a bad smell.  You have low abdominal pain or cramping that is unusual for you. This information is not intended to replace advice given to you by your health care provider. Make sure you discuss any questions you have with your health care provider. Document Released: 02/04/2014 Document Revised: 12/16/2015 Document Reviewed: 09/22/2013 Elsevier Interactive Patient Education  [employer].

## 2017-06-21 NOTE — Progress Notes (Signed)
   Subjective:    Patient ID: Elizabeth Ward, female    DOB: Nov 29, 1945, 71 y.o.   MRN: [id number]  HPI  71 year old patient who has had a prior hysterectomy.  For the past few weeks she is noted bladder prolapse.  She has had a difficult time with intercourse and at times having some bladder emptying issues.  She uses estradiol vaginal cream and at times has a difficult time with the application due to the cystocele.  Past Medical History:  Diagnosis Date  . ANXIETY 08/14/2008  . INSOMNIA 09/07/2009  . Irritable bowel syndrome 09/07/2009  . MENOPAUSE, SURGICAL 08/14/2008  . UNSPECIFIED VAGINITIS AND VULVOVAGINITIS 06/04/2010     Social History   Socioeconomic History  . Marital status: Married    Spouse name: Not on file  . Number of children: Not on file  . Years of education: Not on file  . Highest education level: Not on file  Social Needs  . Financial resource strain: Not on file  . Food insecurity - worry: Not on file  . Food insecurity - inability: Not on file  . Transportation needs - medical: Not on file  . Transportation needs - non-medical: Not on file  Occupational History  . Not on file  Tobacco Use  . Smoking status: Never Smoker  . Smokeless tobacco: Never Used  Substance and Sexual Activity  . Alcohol use: Yes  . Drug use: No  . Sexual activity: Not on file  Other Topics Concern  . Not on file  Social History Narrative  . Not on file    Past Surgical History:  Procedure Laterality Date  . ABDOMINAL HYSTERECTOMY    . COLECTOMY     partial  . TONSILLECTOMY    . TONSILLECTOMY      History reviewed. No pertinent family history.  Allergies  Allergen Reactions  . Prochlorperazine Edisylate Other (See Comments)    Tongue spasms(tongue would droop back in to the throat   . Tape Rash    Current Outpatient Medications on File Prior to Visit  Medication Sig Dispense Refill  . ALPRAZolam (XANAX) 0.25 MG tablet Take 1 tablet (0.25 mg total) 4 (four)  times daily by mouth. 120 tablet 0  . estradiol (ESTRACE) 0.1 MG/GM vaginal cream PLACE 1 APPLICATORFUL VAGINALLY 2 (TWO) TIMES A WEEK. 127.5 g 0  . FLUoxetine (PROZAC) 20 MG tablet TAKE 1 TABLET (20 MG TOTAL) BY MOUTH DAILY. 90 tablet 1  . PREMARIN 0.625 MG tablet TAKE 1 TABLET EVERY DAY FOR 21 DAYS,THEN DO NOT TAKE FOR 7 DAYS 90 tablet 3  . traZODone (DESYREL) 50 MG tablet Take 0.5 tablets (25 mg total) by mouth at bedtime as needed. 45 tablet 1   No current facility-administered medications on file prior to visit.     BP 116/66 (BP Location: Left Arm, Patient Position: Sitting, Cuff Size: Normal)   Pulse 64   Temp 97.6 F (36.4 C) (Oral)   Ht 5\' 2"  (1.575 m)   Wt 114 lb (51.7 kg)   BMI 20.85 kg/m     Review of Systems  Genitourinary: Positive for difficulty urinating and vaginal pain.       Objective:   Physical Exam  Constitutional: She appears well-developed and well-nourished. No distress.          Assessment & Plan:  Status post hysterectomy Cystocele based on patient's history.  We will set up for gynecologic evaluation and treatment  Nyoka Cowden

## 2017-06-26 ENCOUNTER — Encounter: Payer: Self-pay | Admitting: Certified Nurse Midwife

## 2017-06-26 ENCOUNTER — Ambulatory Visit (INDEPENDENT_AMBULATORY_CARE_PROVIDER_SITE_OTHER): Payer: Medicare Other | Admitting: Certified Nurse Midwife

## 2017-06-26 ENCOUNTER — Other Ambulatory Visit: Payer: Self-pay

## 2017-06-26 VITALS — BP 110/60 | HR 68 | Resp 16 | Ht 62.75 in | Wt 111.8 lb

## 2017-06-26 DIAGNOSIS — Z124 Encounter for screening for malignant neoplasm of cervix: Secondary | ICD-10-CM | POA: Diagnosis not present

## 2017-06-26 DIAGNOSIS — Z79890 Hormone replacement therapy (postmenopausal): Secondary | ICD-10-CM

## 2017-06-26 DIAGNOSIS — N952 Postmenopausal atrophic vaginitis: Secondary | ICD-10-CM | POA: Diagnosis not present

## 2017-06-26 DIAGNOSIS — Z01419 Encounter for gynecological examination (general) (routine) without abnormal findings: Secondary | ICD-10-CM

## 2017-06-26 NOTE — Patient Instructions (Addendum)
EXERCISE AND DIET:  We recommended that you start or continue a regular exercise program for good health. Regular exercise means any activity that makes your heart beat faster and makes you sweat.  We recommend exercising at least 30 minutes per day at least 3 days a week, preferably 4 or 5.  We also recommend a diet low in fat and sugar.  Inactivity, poor dietary choices and obesity can cause diabetes, heart attack, stroke, and kidney damage, among others.    ALCOHOL AND SMOKING:  Women should limit their alcohol intake to no more than 7 drinks/beers/glasses of wine (combined, not each!) per week. Moderation of alcohol intake to this level decreases your risk of breast cancer and liver damage. And of course, no recreational drugs are part of a healthy lifestyle.  And absolutely no smoking or even second hand smoke. Most people know smoking can cause heart and lung diseases, but did you know it also contributes to weakening of your bones? Aging of your skin?  Yellowing of your teeth and nails?  CALCIUM AND VITAMIN D:  Adequate intake of calcium and Vitamin D are recommended.  The recommendations for exact amounts of these supplements seem to change often, but generally speaking 600 mg of calcium (either carbonate or citrate) and 800 units of Vitamin D per day seems prudent. Certain women may benefit from higher intake of Vitamin D.  If you are among these women, your doctor will have told you during your visit.    PAP SMEARS:  Pap smears, to check for cervical cancer or precancers,  have traditionally been done yearly, although recent scientific advances have shown that most women can have pap smears less often.  However, every woman still should have a physical exam from her gynecologist every year. It will include a breast check, inspection of the vulva and vagina to check for abnormal growths or skin changes, a visual exam of the cervix, and then an exam to evaluate the size and shape of the uterus and  ovaries.  And after 69 years of age, a rectal exam is indicated to check for rectal cancers. We will also provide age appropriate advice regarding health maintenance, like when you should have certain vaccines, screening for sexually transmitted diseases, bone density testing, colonoscopy, mammograms, etc.   MAMMOGRAMS:  All women over 40 years old should have a yearly mammogram. Many facilities now offer a "3D" mammogram, which may cost around $50 extra out of pocket. If possible,  we recommend you accept the option to have the 3D mammogram performed.  It both reduces the number of women who will be called back for extra views which then turn out to be normal, and it is better than the routine mammogram at detecting truly abnormal areas.    COLONOSCOPY:  Colonoscopy to screen for colon cancer is recommended for all women at age 50.  We know, you hate the idea of the prep.  We agree, BUT, having colon cancer and not knowing it is worse!!  Colon cancer so often starts as a polyp that can be seen and removed at colonscopy, which can quite literally save your life!  And if your first colonoscopy is normal and you have no family history of colon cancer, most women don't have to have it again for 10 years.  Once every ten years, you can do something that may end up saving your life, right?  We will be happy to help you get it scheduled when you are ready.    Be sure to check your insurance coverage so you understand how much it will cost.  It may be covered as a preventative service at no cost, but you should check your particular policy.     kegel  Kegel Exercises Kegel exercises help strengthen the muscles that support the rectum, vagina, small intestine, bladder, and uterus. Doing Kegel exercises can help:  Improve bladder and bowel control.  Improve sexual response.  Reduce problems and discomfort during pregnancy.  Kegel exercises involve squeezing your pelvic floor muscles, which are the same  muscles you squeeze when you try to stop the flow of urine. The exercises can be done while sitting, standing, or lying down, but it is best to vary your position. Phase 1 exercises 1. Squeeze your pelvic floor muscles tight. You should feel a tight lift in your rectal area. If you are a female, you should also feel a tightness in your vaginal area. Keep your stomach, buttocks, and legs relaxed. 2. Hold the muscles tight for up to 10 seconds. 3. Relax your muscles. Repeat this exercise 50 times a day or as many times as told by your health care provider. Continue to do this exercise for at least 4-6 weeks or for as long as told by your health care provider. This information is not intended to replace advice given to you by your health care provider. Make sure you discuss any questions you have with your health care provider. Document Released: 06/26/2012 Document Revised: 03/04/2016 Document Reviewed: 05/30/2015 Elsevier Interactive Patient Education  [employer].

## 2017-06-26 NOTE — Progress Notes (Signed)
71 y.o. G2P0010 Married  Caucasian Fe here to re-establish gyn care and  for an annual exam and discuss vaginal dryness and ? Cystocele. "Feels like something has dropped. Some increase in urination on occasion. Some incomplete emptying at times. Denies burning or UTI symptoms. Has  been sexually active but infrequent. Continues with Estrace cream and Premarin tablet ).625 mg daily  with PCP management. Using cream  twice weekly.Denies vaginal bleeding. Spouse has ED and tried to have intercourse, but unable to penetrate or maintain an erection. He is taking testosterone with no change. Sees PCP for aex and labs and medication management for anxiety,and estrogen replacement.  Patient last mammogram was in 2016, having every 2 years.(doesn't like radiation exposure). No other health issues today.  No LMP recorded. Patient has had a hysterectomy.          Sexually active: Yes.    The current method of family planning is status post hysterectomy and post menopausal status.    Exercising: Yes.    house work Smoker:  no  Health Maintenance: Pap:  Years ago - Hysterectomy History of Abnormal Pap: no MMG:  12/17/14 BIRADS 1 negative/density c Self Breast exams: occasional Colonoscopy:  2002 - normal per patient  BMD:   06/09/11 Normal - [facility] TDaP:  Tetanus 05/23/13 Shingles: none Pneumonia: none Hep C and HIV: none Labs: PCP   reports that  has never smoked. she has never used smokeless tobacco. She reports that she drinks alcohol. She reports that she does not use drugs.  Past Medical History:  Diagnosis Date  . ANXIETY 08/14/2008  . INSOMNIA 09/07/2009  . Irritable bowel syndrome 09/07/2009  . MENOPAUSE, SURGICAL 08/14/2008  . UNSPECIFIED VAGINITIS AND VULVOVAGINITIS 06/04/2010    Past Surgical History:  Procedure Laterality Date  . ABDOMINAL HYSTERECTOMY    . CATARACT EXTRACTION Right   . COLECTOMY     partial  . EYE SURGERY    . TONSILLECTOMY    . TONSILLECTOMY      Current  Outpatient Medications  Medication Sig Dispense Refill  . ALPRAZolam (XANAX) 0.25 MG tablet Take 1 tablet (0.25 mg total) 4 (four) times daily by mouth. 120 tablet 0  . estradiol (ESTRACE) 0.1 MG/GM vaginal cream PLACE 1 APPLICATORFUL VAGINALLY 2 (TWO) TIMES A WEEK. 127.5 g 0  . FLUoxetine (PROZAC) 20 MG tablet TAKE 1 TABLET (20 MG TOTAL) BY MOUTH DAILY. (Patient taking differently: TAKE 1 TABLET (10 MG TOTAL) BY MOUTH DAILY.) 90 tablet 1  . ibuprofen (ADVIL,MOTRIN) 600 MG tablet TAKE ONE TABLET BY MOUTH EVERY 6-8 HOURS AS NEEDED FOR PAIN  0  . PREMARIN 0.625 MG tablet TAKE 1 TABLET EVERY DAY FOR 21 DAYS,THEN DO NOT TAKE FOR 7 DAYS 90 tablet 3  . traZODone (DESYREL) 50 MG tablet Take 0.5 tablets (25 mg total) by mouth at bedtime as needed. 45 tablet 1   No current facility-administered medications for this visit.     Family History  Problem Relation Age of Onset  . Hodgkin's lymphoma Mother   . Lung cancer Father     ROS:  Pertinent items are noted in HPI.  Otherwise, a comprehensive ROS was negative.  Exam:   BP 110/60 (BP Location: Right Arm, Patient Position: Sitting, Cuff Size: Normal)   Pulse 68   Resp 16   Ht 5' 2.75" (1.594 m)   Wt 111 lb 12.8 oz (50.7 kg)   BMI 19.96 kg/m  Height: 5' 2.75" (159.4 cm) Ht Readings from Last 3  Encounters:  06/26/17 5' 2.75" (1.594 m)  06/21/17 5\' 2"  (1.575 m)  01/04/17 5\' 2"  (1.575 m)    General appearance: alert, cooperative and appears stated age Head: Normocephalic, without obvious abnormality, atraumatic Neck: no adenopathy, supple, symmetrical, trachea midline and thyroid normal to inspection and palpation Lungs: clear to auscultation bilaterally Breasts: normal appearance, no masses or tenderness, No nipple retraction or dimpling, No nipple discharge or bleeding, No axillary or supraclavicular adenopathy Heart: regular rate and rhythm Abdomen: soft, non-tender; no masses,  no organomegaly Extremities: extremities normal,  atraumatic, no cyanosis or edema Skin: Skin color, texture, turgor normal. No rashes or lesions Lymph nodes: Cervical, supraclavicular, and axillary nodes normal. No abnormal inguinal nodes palpated Neurologic: Grossly normal   Pelvic: External genitalia:  no lesions, normal female appearance with thinning of vulva and slight dry appearance              Urethra:  normal appearing urethra with no masses, tenderness or lesions              Bartholin's and Skene's: normal                 Vagina: well moisturized  appearing vagina with normal color and discharge, no lesions, vaginal cream noted in vagina, mild rectocele noted, ?cystocele mild noted. Also examined patient standing due to what she had visualized, which with a mirror was rectocele with stool in bowel, reassured all normal appearance with mild rectocele              Cervix: absent              Pap taken: No. Bimanual Exam:  Uterus:  uterus absent              Adnexa: normal adnexa and no mass, fullness, tenderness               Rectovaginal: Confirms               Anus:  normal sphincter tone, no lesions  Chaperone present: yes  A:  Well Woman with normal exam  Menopausal on ERT s/p TAH with ovaries retained due to AUB  Mild rectocele not symptomatic  Vaginal dryness with Estrace cream use with good results  MD management of Estrogen replacement and vaginal cream and anxiety (patient prefers)  Mammogram due  P:   Reviewed health and wellness pertinent to exam  Discussed risks/benefits/warning signs of estrogen use and increase risks with cardiovascular changes and long term use with breast cancer risk. Recommended she have mammogram yearly.  Discussed mild rectocele finding and etiology. Questions addressed and warning signs given. Discussed establishing a normal bowel habit, avoid constipation with diet and fluid and avoid prolonged straining.  Discussed Estrace use will help with vaginal changes, but importance of advising if  vaginal bleeding. Discussed OTC coconut oil use for attempting sexual activity. Questions addressed.  Continue follow up with MD as indicated.  Pap smear: no   counseled on breast self exam, mammography screening, feminine hygiene, adequate intake of calcium and vitamin D, diet and exercise, Kegel's exercises  return annually or prn  An After Visit Summary was printed and given to the patient.

## 2017-07-10 ENCOUNTER — Other Ambulatory Visit: Payer: Self-pay | Admitting: Internal Medicine

## 2017-07-11 ENCOUNTER — Other Ambulatory Visit: Payer: Self-pay | Admitting: Internal Medicine

## 2017-07-12 ENCOUNTER — Telehealth: Payer: Self-pay | Admitting: Internal Medicine

## 2017-07-12 NOTE — Telephone Encounter (Signed)
A Rx refill was called in to the pharmacy yesterday (07/11/17) for alprazolam 0.25mg 

## 2017-07-12 NOTE — Telephone Encounter (Signed)
Copied from [place] [id number]. Topic: Quick Communication - Rx Refill/Question >> Jul 12, 2017 12:23 PM Tye [place] wrote: Eulas Post from [facility] called to state that they misplaced the Rx refill of ALPRAZolam (XANAX) 0.25 MG tablet [[id number]] for the pt, they need to have those resent or contact them to get a verbal reorder for refill, contact pharmacy

## 2017-07-12 NOTE — Telephone Encounter (Signed)
Spoke with [facility] again today, another verbal order was given to [place], pharmacists because it was "lost" again.

## 2017-07-27 DIAGNOSIS — L853 Xerosis cutis: Secondary | ICD-10-CM | POA: Diagnosis not present

## 2017-07-27 DIAGNOSIS — L821 Other seborrheic keratosis: Secondary | ICD-10-CM | POA: Diagnosis not present

## 2017-07-27 DIAGNOSIS — L570 Actinic keratosis: Secondary | ICD-10-CM | POA: Diagnosis not present

## 2017-07-27 DIAGNOSIS — L820 Inflamed seborrheic keratosis: Secondary | ICD-10-CM | POA: Diagnosis not present

## 2017-08-14 ENCOUNTER — Other Ambulatory Visit: Payer: Self-pay | Admitting: Internal Medicine

## 2017-08-31 DIAGNOSIS — L570 Actinic keratosis: Secondary | ICD-10-CM | POA: Diagnosis not present

## 2017-08-31 DIAGNOSIS — B029 Zoster without complications: Secondary | ICD-10-CM | POA: Diagnosis not present

## 2017-08-31 DIAGNOSIS — L308 Other specified dermatitis: Secondary | ICD-10-CM | POA: Diagnosis not present

## 2017-09-14 DIAGNOSIS — B029 Zoster without complications: Secondary | ICD-10-CM | POA: Diagnosis not present

## 2017-09-14 DIAGNOSIS — L720 Epidermal cyst: Secondary | ICD-10-CM | POA: Diagnosis not present

## 2017-09-17 ENCOUNTER — Other Ambulatory Visit: Payer: Self-pay | Admitting: Internal Medicine

## 2017-09-17 NOTE — Telephone Encounter (Signed)
Dr K pt 

## 2017-09-17 NOTE — Telephone Encounter (Signed)
Medication sent in electronically.  

## 2017-10-01 ENCOUNTER — Other Ambulatory Visit: Payer: Self-pay | Admitting: Internal Medicine

## 2017-10-03 ENCOUNTER — Encounter: Payer: Self-pay | Admitting: Certified Nurse Midwife

## 2017-10-03 ENCOUNTER — Other Ambulatory Visit: Payer: Self-pay

## 2017-10-03 ENCOUNTER — Telehealth: Payer: Self-pay | Admitting: Certified Nurse Midwife

## 2017-10-03 ENCOUNTER — Ambulatory Visit (INDEPENDENT_AMBULATORY_CARE_PROVIDER_SITE_OTHER): Payer: Medicare Other | Admitting: Certified Nurse Midwife

## 2017-10-03 VITALS — Ht 62.75 in

## 2017-10-03 DIAGNOSIS — N952 Postmenopausal atrophic vaginitis: Secondary | ICD-10-CM | POA: Diagnosis not present

## 2017-10-03 DIAGNOSIS — N898 Other specified noninflammatory disorders of vagina: Secondary | ICD-10-CM | POA: Diagnosis not present

## 2017-10-03 NOTE — Progress Notes (Signed)
72 y.o. Married Caucasian female G2P0010 here with complaint of vaginal symptoms of itching, burning, and swelling of vulva with increase discharge. Describes discharge as white, no odorous. Patient and spouse tried to have sexual activity and noted increase in swelling several days after. He had obtained new cream to use and she felt this may have been what "set all this off". Has not tried tub bath for comfort. Onset of symptoms 3 days ago. Denies new personal products or vaginal dryness. No  STD concerns. Urinary symptoms some frequency with burning when touches skin only. . Menopausal using estrogen cream for dryness. Used last pm with some comfort. No other new product use. Also has noted some edema of feet and has increased salt in diet.. Plans to increase water to see if this will help.  ROS pertinent to HPI  O:Healthy female WDWN Affect: normal, orientation x 3  Exam:Skin: warm and dry Abdomen:soft, non tender,negative suprapubic  Inguinal Lymph nodes: no enlargement or tenderness Pelvic exam: External genital: normal female with redness and scaling noted on vulva bilateral. Slightly tender to touch,scant exudate, no lesions or blistering noted, wet prep taken BUS: negative Bladder: non tender, urethral meatus non tender, no redness Vagina: estrogen cream present with yellow discharge, scant odor, more medication scent, Affirm taken  Cervix/uterus absent Adnexa:normal,  no masses or fullness noted Rectal area: no redness or scaling or lesion noted Feet/ankles bilateral no edema visible  Wet Prep results: KOH, Saline negative ? , but difficult to see due to Estrogen cream use  A:Normal pelvic exam Atrophic vaginitis with Estrogen cream use Suspect localized reaction to new lubrication cream Feet/ankle swelling   P:Discussed findings of normal pelvic exam and etiology. Discussed Aveeno sitz bath for comfort. Avoid moist clothes for extended period of time. If working out in gym  clothes for long periods of time change underwear if possible. Discussed using Aveeno sitz bath twice daily to calm reaction and discomfort down. Can not treat until sure if there is a source. Discussed no other products on skin, until lab back. If negative can obtain Aveeno Eczema cream to use on Vulva area. Lab: affirm Discussed decrease salt and boxed foods in diet and increase water intake with lemon added. If she feels this is not resolving needs to see PCP for evaluation. Questions addressed.   Rv prn

## 2017-10-03 NOTE — Telephone Encounter (Signed)
Spoke with patient. Patient states she is having swelling of her vulva. Area is very painful. Reports she cannot walk without pain. Feels like the tissue is "on fire." Offered appointment for this morning at 10:30 am or 10:45 am, but patient declines both. Appointment scheduled for today at 3:30 pm with Melvia Heaps CNM. Patient is agreeable to date and time.  Routing to provider for final review. Patient agreeable to disposition. Will close encounter.

## 2017-10-03 NOTE — Telephone Encounter (Signed)
Patient called requesting an appointment with Melvia Heaps, CNM today. Patient states her "vulva tissue is very swollen and painful". Reviewed scheduling options with Leeroy Bock, offered appointment today at 10:15 AM with Melvia Heaps, CNM, patient declined. Advised patient will forward to Triage Nurse to review.  Forwarding to Triage Nurse

## 2017-10-04 ENCOUNTER — Encounter: Payer: Self-pay | Admitting: Certified Nurse Midwife

## 2017-10-04 LAB — VAGINITIS/VAGINOSIS, DNA PROBE
CANDIDA SPECIES: POSITIVE — AB
GARDNERELLA VAGINALIS: NEGATIVE
TRICHOMONAS VAG: NEGATIVE

## 2017-10-05 ENCOUNTER — Other Ambulatory Visit: Payer: Self-pay | Admitting: *Deleted

## 2017-10-05 MED ORDER — NYSTATIN-TRIAMCINOLONE 100000-0.1 UNIT/GM-% EX OINT: 1.0000 "application " | TOPICAL_OINTMENT | Freq: Two times a day (BID) | CUTANEOUS | 0 refills | Status: AC

## 2017-10-05 MED ORDER — FLUCONAZOLE 150 MG PO TABS: ORAL_TABLET | ORAL | 0 refills | Status: DC

## 2017-10-18 ENCOUNTER — Other Ambulatory Visit: Payer: Self-pay | Admitting: Internal Medicine

## 2017-10-18 NOTE — Telephone Encounter (Signed)
Left message for patient to return phone call.  

## 2017-11-06 ENCOUNTER — Other Ambulatory Visit: Payer: Self-pay | Admitting: Internal Medicine

## 2017-11-21 ENCOUNTER — Other Ambulatory Visit: Payer: Self-pay | Admitting: Internal Medicine

## 2017-12-25 ENCOUNTER — Other Ambulatory Visit: Payer: Self-pay | Admitting: Internal Medicine

## 2017-12-26 ENCOUNTER — Other Ambulatory Visit: Payer: Self-pay | Admitting: Internal Medicine

## 2018-01-10 ENCOUNTER — Other Ambulatory Visit: Payer: Self-pay | Admitting: Internal Medicine

## 2018-01-28 ENCOUNTER — Other Ambulatory Visit: Payer: Self-pay | Admitting: Internal Medicine

## 2018-01-29 ENCOUNTER — Ambulatory Visit (INDEPENDENT_AMBULATORY_CARE_PROVIDER_SITE_OTHER): Payer: Medicare Other | Admitting: Internal Medicine

## 2018-01-29 ENCOUNTER — Encounter: Payer: Self-pay | Admitting: Internal Medicine

## 2018-01-29 VITALS — BP 130/68 | HR 73 | Temp 97.9°F | Wt 114.0 lb

## 2018-01-29 DIAGNOSIS — F5101 Primary insomnia: Secondary | ICD-10-CM

## 2018-01-29 DIAGNOSIS — F411 Generalized anxiety disorder: Secondary | ICD-10-CM | POA: Diagnosis not present

## 2018-01-29 DIAGNOSIS — R202 Paresthesia of skin: Secondary | ICD-10-CM

## 2018-01-29 DIAGNOSIS — Z0000 Encounter for general adult medical examination without abnormal findings: Secondary | ICD-10-CM | POA: Diagnosis not present

## 2018-01-29 DIAGNOSIS — E785 Hyperlipidemia, unspecified: Secondary | ICD-10-CM

## 2018-01-29 MED ORDER — ALPRAZOLAM 0.25 MG PO TABS: 0.2500 mg | ORAL_TABLET | Freq: Four times a day (QID) | ORAL | 0 refills | Status: DC

## 2018-01-29 NOTE — Patient Instructions (Signed)
Consider behavioral health consultation Return in 4 weeks for follow-up  Please report any new or worsening symptoms

## 2018-01-29 NOTE — Progress Notes (Signed)
Subjective:    Patient ID: Elizabeth Ward, female    DOB: November 04, 1945, 72 y.o.   MRN: [id number]  HPI  72 year old patient who is seen with a chief complaint of feet and ankle swelling. On closer questioning she has a bout a 1-1/2-year history of paresthesias involving the distal legs feet and ankles.  She describes both a pins-and-needles type of sensation as well as numbness.  Symptoms have intensified over the past 6 months and especially the past 3 weeks. She has a history of anxiety disorder and has been on chronic anxiolytics.  She has been on alprazolam 3-1/2 to 4 tablets daily for some time.  There has been no dose escalation.  She describes considerable situational stress and is primary caregiver for a young grandchild.  She is on daily fluoxetine and also uses trazodone at bedtime to assist with sleep.  She states she occasionally awakens at 3 AM and requires alprazolam to assist with sleep.  Past Medical History:  Diagnosis Date  . ANXIETY 08/14/2008  . INSOMNIA 09/07/2009  . Irritable bowel syndrome 09/07/2009  . MENOPAUSE, SURGICAL 08/14/2008  . Shingles   . UNSPECIFIED VAGINITIS AND VULVOVAGINITIS 06/04/2010     Social History   Socioeconomic History  . Marital status: Married    Spouse name: Not on file  . Number of children: Not on file  . Years of education: Not on file  . Highest education level: Not on file  Occupational History  . Not on file  Social Needs  . Financial resource strain: Not on file  . Food insecurity:    Worry: Not on file    Inability: Not on file  . Transportation needs:    Medical: Not on file    Non-medical: Not on file  Tobacco Use  . Smoking status: Never Smoker  . Smokeless tobacco: Never Used  Substance and Sexual Activity  . Alcohol use: Yes    Alcohol/week: 4.2 oz    Types: 7 Glasses of wine per week  . Drug use: No  . Sexual activity: Yes    Birth control/protection: Surgical, Post-menopausal  Lifestyle  . Physical activity:      Days per week: Not on file    Minutes per session: Not on file  . Stress: Not on file  Relationships  . Social connections:    Talks on phone: Not on file    Gets together: Not on file    Attends religious service: Not on file    Active member of club or organization: Not on file    Attends meetings of clubs or organizations: Not on file    Relationship status: Not on file  . Intimate partner violence:    Fear of current or ex partner: Not on file    Emotionally abused: Not on file    Physically abused: Not on file    Forced sexual activity: Not on file  Other Topics Concern  . Not on file  Social History Narrative  . Not on file    Past Surgical History:  Procedure Laterality Date  . ABDOMINAL HYSTERECTOMY    . CATARACT EXTRACTION Right   . COLECTOMY     partial  . EYE SURGERY    . TONSILLECTOMY    . TONSILLECTOMY      Family History  Problem Relation Age of Onset  . Hodgkin's lymphoma Mother   . Lung cancer Father     Allergies  Allergen Reactions  . Prochlorperazine Edisylate Other (  See Comments)    Tongue spasms(tongue would droop back in to the throat   . Tape Rash    Current Outpatient Medications on File Prior to Visit  Medication Sig Dispense Refill  . estradiol (ESTRACE) 0.1 MG/GM vaginal cream PLACE 1 APPLICATORFUL VAGINALLY 2 (TWO) TIMES A WEEK. 127.5 g 0  . fluconazole (DIFLUCAN) 150 MG tablet Take one tablet now po, repeat in 5 days. 2 tablet 0  . FLUoxetine (PROZAC) 20 MG tablet TAKE 1 TABLET BY MOUTH EVERY DAY 90 tablet 1  . ibuprofen (ADVIL,MOTRIN) 600 MG tablet TAKE ONE TABLET BY MOUTH EVERY 6-8 HOURS AS NEEDED FOR PAIN  0  . PREMARIN 0.625 MG tablet TAKE 1 TABLET BY MOUTH DAILY X 21 DAYS THEN STOP X 7 DAYS 90 tablet 1  . traZODone (DESYREL) 50 MG tablet TAKE 1/2 TABLET AT BEDTIME AS NEEDED 45 tablet 1   No current facility-administered medications on file prior to visit.     BP 130/68 (BP Location: Right Arm, Patient Position: Sitting,  Cuff Size: Normal)   Pulse 73   Temp 97.9 F (36.6 C) (Oral)   Wt 114 lb (51.7 kg)   SpO2 98%   BMI 20.36 kg/m     Review of Systems  Constitutional: Negative.   HENT: Negative for congestion, dental problem, hearing loss, rhinorrhea, sinus pressure, sore throat and tinnitus.   Eyes: Negative for pain, discharge and visual disturbance.  Respiratory: Negative for cough and shortness of breath.   Cardiovascular: Negative for chest pain, palpitations and leg swelling.  Gastrointestinal: Negative for abdominal distention, abdominal pain, blood in stool, constipation, diarrhea, nausea and vomiting.  Genitourinary: Negative for difficulty urinating, dysuria, flank pain, frequency, hematuria, pelvic pain, urgency, vaginal bleeding, vaginal discharge and vaginal pain.  Musculoskeletal: Negative for arthralgias, gait problem and joint swelling.  Skin: Negative for rash.  Neurological: Positive for numbness. Negative for dizziness, syncope, speech difficulty, weakness and headaches.  Hematological: Negative for adenopathy.  Psychiatric/Behavioral: Positive for behavioral problems, dysphoric mood and sleep disturbance. Negative for agitation. The patient is nervous/anxious.        Objective:   Physical Exam  Constitutional: She is oriented to person, place, and time. She appears well-developed and well-nourished.  Blood pressure well controlled  HENT:  Head: Normocephalic.  Right Ear: External ear normal.  Left Ear: External ear normal.  Mouth/Throat: Oropharynx is clear and moist.  Eyes: Pupils are equal, round, and reactive to light. Conjunctivae and EOM are normal.  Neck: Normal range of motion. Neck supple. No thyromegaly present.  Cardiovascular: Normal rate, regular rhythm, normal heart sounds and intact distal pulses.  Pulmonary/Chest: Effort normal and breath sounds normal.  Abdominal: Soft. Bowel sounds are normal. She exhibits no mass. There is no tenderness.  Musculoskeletal:  Normal range of motion.  There is no pedal edema Feet are slightly cool to touch but pedal pulses are normal  Lymphadenopathy:    She has no cervical adenopathy.  Neurological: She is alert and oriented to person, place, and time.  Skin: Skin is warm and dry. No rash noted.  Psychiatric: Judgment and thought content normal.  Patient intermittently tearful during the encounter          Assessment & Plan:   Adjustment disorder with anxiety and depressed mood Chronic anxiety disorder Chronic benzodiazepine use  Behavioral health referral strongly encouraged.  Contact information dispensed  Paresthesias of the feet and lower extremities.  Will check updated labs including B12 level.  Medications refilled including alprazolam  Reassess 4 weeks  Marletta Lor

## 2018-01-30 LAB — CBC WITH DIFFERENTIAL/PLATELET
BASOS PCT: 0.3 % (ref 0.0–3.0)
Basophils Absolute: 0 10*3/uL (ref 0.0–0.1)
EOS ABS: 0.1 10*3/uL (ref 0.0–0.7)
EOS PCT: 1 % (ref 0.0–5.0)
HEMATOCRIT: 39.1 % (ref 36.0–46.0)
HEMOGLOBIN: 13.1 g/dL (ref 12.0–15.0)
LYMPHS PCT: 28.8 % (ref 12.0–46.0)
Lymphs Abs: 1.9 10*3/uL (ref 0.7–4.0)
MCHC: 33.6 g/dL (ref 30.0–36.0)
MCV: 87.9 fl (ref 78.0–100.0)
Monocytes Absolute: 0.5 10*3/uL (ref 0.1–1.0)
Monocytes Relative: 7.6 % (ref 3.0–12.0)
Neutro Abs: 4.2 10*3/uL (ref 1.4–7.7)
Neutrophils Relative %: 62.3 % (ref 43.0–77.0)
PLATELETS: 244 10*3/uL (ref 150.0–400.0)
RBC: 4.45 Mil/uL (ref 3.87–5.11)
RDW: 12.8 % (ref 11.5–15.5)
WBC: 6.7 10*3/uL (ref 4.0–10.5)

## 2018-01-30 LAB — LIPID PANEL
Cholesterol: 189 mg/dL (ref 0–200)
HDL: 99.4 mg/dL (ref >39.00)
LDL CALC: 67 mg/dL (ref 0–99)
NONHDL: 89.13
TRIGLYCERIDES: 113 mg/dL (ref 0.0–149.0)
Total CHOL/HDL Ratio: 2
VLDL: 22.6 mg/dL (ref 0.0–40.0)

## 2018-01-30 LAB — TSH: TSH: 1.62 u[IU]/mL (ref 0.35–4.50)

## 2018-01-30 LAB — COMPREHENSIVE METABOLIC PANEL
ALT: 13 U/L (ref 0–35)
AST: 15 U/L (ref 0–37)
Albumin: 4.3 g/dL (ref 3.5–5.2)
Alkaline Phosphatase: 47 U/L (ref 39–117)
BUN: 17 mg/dL (ref 6–23)
CALCIUM: 9.7 mg/dL (ref 8.4–10.5)
CHLORIDE: 97 meq/L (ref 96–112)
CO2: 29 meq/L (ref 19–32)
CREATININE: 0.8 mg/dL (ref 0.40–1.20)
GFR: 75.26 mL/min (ref >60.00)
Glucose, Bld: 73 mg/dL (ref 70–99)
Potassium: 6.3 mEq/L (ref 3.5–5.1)
SODIUM: 135 meq/L (ref 135–145)
Total Bilirubin: 0.7 mg/dL (ref 0.2–1.2)
Total Protein: 7.3 g/dL (ref 6.0–8.3)

## 2018-02-01 ENCOUNTER — Other Ambulatory Visit (INDEPENDENT_AMBULATORY_CARE_PROVIDER_SITE_OTHER): Payer: Medicare Other

## 2018-02-01 DIAGNOSIS — E785 Hyperlipidemia, unspecified: Secondary | ICD-10-CM | POA: Diagnosis not present

## 2018-02-01 LAB — POTASSIUM: POTASSIUM: 4.6 meq/L (ref 3.5–5.1)

## 2018-02-11 ENCOUNTER — Other Ambulatory Visit: Payer: Self-pay | Admitting: Internal Medicine

## 2018-02-27 ENCOUNTER — Ambulatory Visit (INDEPENDENT_AMBULATORY_CARE_PROVIDER_SITE_OTHER): Payer: Medicare Other | Admitting: Internal Medicine

## 2018-02-27 ENCOUNTER — Encounter: Payer: Self-pay | Admitting: Internal Medicine

## 2018-02-27 VITALS — BP 122/66 | HR 75 | Temp 98.2°F | Wt 115.4 lb

## 2018-02-27 DIAGNOSIS — IMO0001 Reserved for inherently not codable concepts without codable children: Secondary | ICD-10-CM

## 2018-02-27 DIAGNOSIS — R209 Unspecified disturbances of skin sensation: Secondary | ICD-10-CM

## 2018-02-27 LAB — VITAMIN B12: VITAMIN B 12: 471 pg/mL (ref 211–911)

## 2018-02-27 MED ORDER — GABAPENTIN 300 MG PO CAPS: 300.0000 mg | ORAL_CAPSULE | Freq: Two times a day (BID) | ORAL | 4 refills | Status: DC

## 2018-02-27 NOTE — Patient Instructions (Signed)
Continue gabapentin 300 mg twice daily There is conduction studies as discussed  Follow-up 4 weeks

## 2018-02-27 NOTE — Progress Notes (Signed)
Subjective:    Patient ID: Elizabeth Ward, female    DOB: 1946-06-27, 72 y.o.   MRN: [id number]  HPI  72 year old patient who is seen today for follow-up of paresthesias involving her lower extremities.  She describes a uncomfortable pins-and-needles and numb sensation involving the lower extremities just distal to the knees.  This has been present for 1/2 years but has intensified over the past 2 or 3 months. She started taking gabapentin 300 mg twice daily that was prescribed by GYN for shingles in the past she has tolerated this dose well without somnolence and feels that there is been some benefit Laboratory screen was checked last month but a B12 level was omitted  Past Medical History:  Diagnosis Date  . ANXIETY 08/14/2008  . INSOMNIA 09/07/2009  . Irritable bowel syndrome 09/07/2009  . MENOPAUSE, SURGICAL 08/14/2008  . Shingles   . UNSPECIFIED VAGINITIS AND VULVOVAGINITIS 06/04/2010     Social History   Socioeconomic History  . Marital status: Married    Spouse name: Not on file  . Number of children: Not on file  . Years of education: Not on file  . Highest education level: Not on file  Occupational History  . Not on file  Social Needs  . Financial resource strain: Not on file  . Food insecurity:    Worry: Not on file    Inability: Not on file  . Transportation needs:    Medical: Not on file    Non-medical: Not on file  Tobacco Use  . Smoking status: Never Smoker  . Smokeless tobacco: Never Used  Substance and Sexual Activity  . Alcohol use: Yes    Alcohol/week: 4.2 oz    Types: 7 Glasses of wine per week  . Drug use: No  . Sexual activity: Yes    Birth control/protection: Surgical, Post-menopausal  Lifestyle  . Physical activity:    Days per week: Not on file    Minutes per session: Not on file  . Stress: Not on file  Relationships  . Social connections:    Talks on phone: Not on file    Gets together: Not on file    Attends religious service: Not on  file    Active member of club or organization: Not on file    Attends meetings of clubs or organizations: Not on file    Relationship status: Not on file  . Intimate partner violence:    Fear of current or ex partner: Not on file    Emotionally abused: Not on file    Physically abused: Not on file    Forced sexual activity: Not on file  Other Topics Concern  . Not on file  Social History Narrative  . Not on file    Past Surgical History:  Procedure Laterality Date  . ABDOMINAL HYSTERECTOMY    . CATARACT EXTRACTION Right   . COLECTOMY     partial  . EYE SURGERY    . TONSILLECTOMY    . TONSILLECTOMY      Family History  Problem Relation Age of Onset  . Hodgkin's lymphoma Mother   . Lung cancer Father     Allergies  Allergen Reactions  . Prochlorperazine Edisylate Other (See Comments)    Tongue spasms(tongue would droop back in to the throat   . Tape Rash    Current Outpatient Medications on File Prior to Visit  Medication Sig Dispense Refill  . ALPRAZolam (XANAX) 0.25 MG tablet Take 1 tablet (0.25  mg total) by mouth 4 (four) times daily. 120 tablet 0  . estradiol (ESTRACE) 0.1 MG/GM vaginal cream PLACE 1 APPLICATORFUL VAGINALLY 2 (TWO) TIMES A WEEK. 127.5 g 0  . fluconazole (DIFLUCAN) 150 MG tablet Take one tablet now po, repeat in 5 days. 2 tablet 0  . FLUoxetine (PROZAC) 20 MG tablet TAKE 1 TABLET BY MOUTH EVERY DAY 90 tablet 1  . ibuprofen (ADVIL,MOTRIN) 600 MG tablet TAKE ONE TABLET BY MOUTH EVERY 6-8 HOURS AS NEEDED FOR PAIN  0  . PREMARIN 0.625 MG tablet TAKE 1 TABLET BY MOUTH DAILY X 21 DAYS THEN STOP X 7 DAYS 90 tablet 1  . traZODone (DESYREL) 50 MG tablet TAKE 1/2 TABLET AT BEDTIME AS NEEDED 45 tablet 1   No current facility-administered medications on file prior to visit.     BP 122/66 (BP Location: Right Arm, Patient Position: Sitting, Cuff Size: Normal)   Pulse 75   Temp 98.2 F (36.8 C) (Oral)   Wt 115 lb 6.4 oz (52.3 kg)   SpO2 97%   BMI 20.61  kg/m     Review of Systems  Constitutional: Negative.   HENT: Negative for congestion, dental problem, hearing loss, rhinorrhea, sinus pressure, sore throat and tinnitus.   Eyes: Negative for pain, discharge and visual disturbance.  Respiratory: Negative for cough and shortness of breath.   Cardiovascular: Negative for chest pain, palpitations and leg swelling.  Gastrointestinal: Negative for abdominal distention, abdominal pain, blood in stool, constipation, diarrhea, nausea and vomiting.  Genitourinary: Negative for difficulty urinating, dysuria, flank pain, frequency, hematuria, pelvic pain, urgency, vaginal bleeding, vaginal discharge and vaginal pain.  Musculoskeletal: Negative for arthralgias, gait problem and joint swelling.  Skin: Negative for rash.  Neurological: Positive for numbness. Negative for dizziness, syncope, speech difficulty, weakness and headaches.  Hematological: Negative for adenopathy.  Psychiatric/Behavioral: Negative for agitation, behavioral problems and dysphoric mood. The patient is not nervous/anxious.        Objective:   Physical Exam  Constitutional: She is oriented to person, place, and time. She appears well-developed and well-nourished.  HENT:  Head: Normocephalic.  Right Ear: External ear normal.  Left Ear: External ear normal.  Mouth/Throat: Oropharynx is clear and moist.  Eyes: Pupils are equal, round, and reactive to light. Conjunctivae and EOM are normal.  Neck: Normal range of motion. Neck supple. No thyromegaly present.  Cardiovascular: Normal rate, regular rhythm, normal heart sounds and intact distal pulses.  Pedal pulses full  Pulmonary/Chest: Effort normal and breath sounds normal.  Abdominal: Soft. Bowel sounds are normal. She exhibits no mass. There is no tenderness.  Musculoskeletal: Normal range of motion.  Lymphadenopathy:    She has no cervical adenopathy.  Neurological: She is alert and oriented to person, place, and time. No  sensory deficit.  Intact to soft touch monofilament testing and vibratory sensation Patellar and Achilles reflexes blunted  Skin: Skin is warm and dry. No rash noted.  Psychiatric: She has a normal mood and affect. Her behavior is normal.          Assessment & Plan:  Paresthesias lower extremities.  Patient has been on Neurontin 300 mg twice daily.  She has tolerated this initial dose well.  We will continue.  Check nerve conduction studies and B12 level  Follow-up 4 weeks  Marletta Lor

## 2018-03-01 DIAGNOSIS — L600 Ingrowing nail: Secondary | ICD-10-CM | POA: Diagnosis not present

## 2018-03-05 ENCOUNTER — Other Ambulatory Visit: Payer: Self-pay | Admitting: Internal Medicine

## 2018-03-18 ENCOUNTER — Other Ambulatory Visit: Payer: Self-pay | Admitting: Internal Medicine

## 2018-03-18 ENCOUNTER — Telehealth: Payer: Self-pay | Admitting: Internal Medicine

## 2018-03-18 MED ORDER — ALPRAZOLAM 0.25 MG PO TABS: 0.2500 mg | ORAL_TABLET | Freq: Four times a day (QID) | ORAL | 0 refills | Status: DC

## 2018-03-18 NOTE — Telephone Encounter (Signed)
New prescription for [facility]ol[facility] called into [facility] on [address]. Patient will need to discuss manufacturer etc. with her druggist

## 2018-03-18 NOTE — Telephone Encounter (Signed)
Please advise 

## 2018-03-18 NOTE — Telephone Encounter (Signed)
Copied from [place] [id number]. Topic: Inquiry >> Mar 18, 2018  1:26 PM Margot Ables wrote: Reason for CRM: multiple concerns 1. Pt states that she has not received a call for nerve conduction study - per order it was sent to [facility] and I have provided pt phone #.  2. Pt has quit taking the gabapentin - pt states it was causing dizziness, nausea, confusion, trouble speaking, unsteadiness, and inability to drive. 3. Pt states Xanax was called into the wrong pharmacy - [facility] [address] but [facility] called [facility] to [facility] on [address] - pt states that she went ahead and picked up because they couldn't transfer it - she states it is a Risk analyst and does not work the same - it seems too strong at first then does not last as long - she also doesn't like the pill as it crumbles if she tries to cut it to take half - pt states [facility] is superior and easy to break in half is she wants a smaller amount and that she doesn't have side effects - pt wants to know how to correct the issue - she wants the medication/manufacturer [facility] and will turn in the other if needed but wants covered by insurance - please advise.

## 2018-03-19 ENCOUNTER — Encounter: Payer: Self-pay | Admitting: Neurology

## 2018-03-19 ENCOUNTER — Other Ambulatory Visit: Payer: Self-pay | Admitting: *Deleted

## 2018-03-19 DIAGNOSIS — R200 Anesthesia of skin: Secondary | ICD-10-CM

## 2018-03-19 NOTE — Telephone Encounter (Signed)
Patient notified of results and verbalized understanding.  

## 2018-03-26 NOTE — Telephone Encounter (Signed)
[facility] states [facility] not [facility] ALPRAZolam (XANAX) 0.25 MG tablet Without a call from the office to [place] this script. They need a verbal because the month is not up and it appears she already had this filled. See previous message below.  [id number] [place], [place] [phone] (Phone) [phone] (Fax)

## 2018-03-26 NOTE — Telephone Encounter (Signed)
Left message to return phone call. Spoke to pharmacist and they informed me that pt had this Rx filled 03/07/2018. She is asking for another refill on this Rx. Medication refill is too soon.

## 2018-03-27 NOTE — Telephone Encounter (Signed)
Called pt husband for lab work an Pt answered the phone and stated that she did receive my message regarding Rx.

## 2018-04-02 ENCOUNTER — Ambulatory Visit (INDEPENDENT_AMBULATORY_CARE_PROVIDER_SITE_OTHER): Payer: Medicare Other | Admitting: Internal Medicine

## 2018-04-02 ENCOUNTER — Encounter: Payer: Self-pay | Admitting: Internal Medicine

## 2018-04-02 VITALS — BP 100/60 | HR 73 | Temp 98.1°F | Wt 114.6 lb

## 2018-04-02 DIAGNOSIS — R202 Paresthesia of skin: Secondary | ICD-10-CM | POA: Diagnosis not present

## 2018-04-02 MED ORDER — FLUTICASONE PROPIONATE 50 MCG/ACT NA SUSP: 2.0000 | Freq: Every day | NASAL | 6 refills | Status: DC

## 2018-04-02 NOTE — Patient Instructions (Signed)
Nerve conduction studies as discussed  Neurology consultation  Podiatry consultation

## 2018-04-02 NOTE — Progress Notes (Signed)
Subjective:    Patient ID: Elizabeth Ward, female    DOB: [birth year], 72 y.o.   MRN: [id number]  HPI  72 year old patient who is seen today for follow-up of paresthesias involving her lower extremities.  She is scheduled for nerve conduction studies next week.  She continues to have both a numbness and pins-and-needles type dysesthesias distal to the knees. She had been tolerant of gabapentin 300 mg twice daily in the past for postherpetic pain and this dose was resumed for treatment of possible neuropathic pain.  This high dose was very poorly tolerated with dizziness weakness and was discontinued after 1 or 2 dosages.  She also complains of some nasal discomfort and congestion no rhinorrhea  Past Medical History:  Diagnosis Date  . ANXIETY 08/14/2008  . INSOMNIA 09/07/2009  . Irritable bowel syndrome 09/07/2009  . MENOPAUSE, SURGICAL 08/14/2008  . Shingles   . UNSPECIFIED VAGINITIS AND VULVOVAGINITIS 06/04/2010     Social History   Socioeconomic History  . Marital status: Married    Spouse name: Not on file  . Number of children: Not on file  . Years of education: Not on file  . Highest education level: Not on file  Occupational History  . Not on file  Social Needs  . Financial resource strain: Not on file  . Food insecurity:    Worry: Not on file    Inability: Not on file  . Transportation needs:    Medical: Not on file    Non-medical: Not on file  Tobacco Use  . Smoking status: Never Smoker  . Smokeless tobacco: Never Used  Substance and Sexual Activity  . Alcohol use: Yes    Alcohol/week: 7.0 standard drinks    Types: 7 Glasses of wine per week  . Drug use: No  . Sexual activity: Yes    Birth control/protection: Surgical, Post-menopausal  Lifestyle  . Physical activity:    Days per week: Not on file    Minutes per session: Not on file  . Stress: Not on file  Relationships  . Social connections:    Talks on phone: Not on file    Gets together: Not on file   Attends religious service: Not on file    Active member of club or organization: Not on file    Attends meetings of clubs or organizations: Not on file    Relationship status: Not on file  . Intimate partner violence:    Fear of current or ex partner: Not on file    Emotionally abused: Not on file    Physically abused: Not on file    Forced sexual activity: Not on file  Other Topics Concern  . Not on file  Social History Narrative  . Not on file    Past Surgical History:  Procedure Laterality Date  . ABDOMINAL HYSTERECTOMY    . CATARACT EXTRACTION Right   . COLECTOMY     partial  . EYE SURGERY    . TONSILLECTOMY    . TONSILLECTOMY      Family History  Problem Relation Age of Onset  . Hodgkin's lymphoma Mother   . Lung cancer Father     Allergies  Allergen Reactions  . Prochlorperazine Edisylate Other (See Comments)    Tongue spasms(tongue would droop back in to the throat   . Tape Rash    Current Outpatient Medications on File Prior to Visit  Medication Sig Dispense Refill  . ALPRAZolam (XANAX) 0.25 MG tablet Take 1 tablet (  0.25 mg total) by mouth 4 (four) times daily. 120 tablet 0  . estradiol (ESTRACE) 0.1 MG/GM vaginal cream PLACE 1 APPLICATORFUL VAGINALLY 2 (TWO) TIMES A WEEK. 127.5 g 0  . fluconazole (DIFLUCAN) 150 MG tablet Take one tablet now po, repeat in 5 days. 2 tablet 0  . FLUoxetine (PROZAC) 20 MG tablet TAKE 1 TABLET BY MOUTH EVERY DAY 90 tablet 1  . ibuprofen (ADVIL,MOTRIN) 600 MG tablet TAKE ONE TABLET BY MOUTH EVERY 6-8 HOURS AS NEEDED FOR PAIN  0  . PREMARIN 0.625 MG tablet TAKE 1 TABLET BY MOUTH DAILY X 21 DAYS THEN STOP X 7 DAYS 90 tablet 1  . gabapentin (NEURONTIN) 300 MG capsule Take 1 capsule (300 mg total) by mouth 2 (two) times daily. (Patient not taking: Reported on 04/02/2018) 60 capsule 4   No current facility-administered medications on file prior to visit.     BP 100/60 (BP Location: Right Arm, Patient Position: Sitting, Cuff Size:  Normal)   Pulse 73   Temp 98.1 F (36.7 C) (Oral)   Wt 114 lb 9.6 oz (52 kg)   SpO2 100%   BMI 20.46 kg/m     Review of Systems  Constitutional: Negative.   HENT: Positive for congestion, sinus pressure and sinus pain. Negative for dental problem, hearing loss, rhinorrhea, sore throat and tinnitus.   Eyes: Negative for pain, discharge and visual disturbance.  Respiratory: Negative for cough and shortness of breath.   Cardiovascular: Negative for chest pain, palpitations and leg swelling.  Gastrointestinal: Negative for abdominal distention, abdominal pain, blood in stool, constipation, diarrhea, nausea and vomiting.  Genitourinary: Negative for difficulty urinating, dysuria, flank pain, frequency, hematuria, pelvic pain, urgency, vaginal bleeding, vaginal discharge and vaginal pain.  Musculoskeletal: Negative for arthralgias, gait problem and joint swelling.  Skin: Negative for rash.  Neurological: Positive for numbness. Negative for dizziness, syncope, speech difficulty, weakness and headaches.  Hematological: Negative for adenopathy.  Psychiatric/Behavioral: Negative for agitation, behavioral problems and dysphoric mood. The patient is not nervous/anxious.        Objective:   Physical Exam  Constitutional: She appears well-developed and well-nourished.  HENT:  Head: Normocephalic.  Nose: Nose normal.  Mouth/Throat: Oropharynx is clear and moist.  Neurological:   Sensory exam unremarkable Patellar and Achilles reflexes blunted compared to her upper extremities          Assessment & Plan:   Lower extremity dysesthesias.  Patient has had nerve conduction studies ordered next week;  we will set up for neurology consultation.  Will hold therapy at this time pending nerve conduction studies and neurology consultation  Marletta Lor

## 2018-04-03 DIAGNOSIS — H02834 Dermatochalasis of left upper eyelid: Secondary | ICD-10-CM | POA: Diagnosis not present

## 2018-04-03 DIAGNOSIS — Z961 Presence of intraocular lens: Secondary | ICD-10-CM | POA: Diagnosis not present

## 2018-04-03 DIAGNOSIS — H2512 Age-related nuclear cataract, left eye: Secondary | ICD-10-CM | POA: Diagnosis not present

## 2018-04-03 DIAGNOSIS — H57813 Brow ptosis, bilateral: Secondary | ICD-10-CM | POA: Diagnosis not present

## 2018-04-03 DIAGNOSIS — H02831 Dermatochalasis of right upper eyelid: Secondary | ICD-10-CM | POA: Diagnosis not present

## 2018-04-03 DIAGNOSIS — H43822 Vitreomacular adhesion, left eye: Secondary | ICD-10-CM | POA: Diagnosis not present

## 2018-04-09 ENCOUNTER — Telehealth: Payer: Self-pay

## 2018-04-09 ENCOUNTER — Encounter: Payer: Self-pay | Admitting: Neurology

## 2018-04-09 ENCOUNTER — Other Ambulatory Visit (INDEPENDENT_AMBULATORY_CARE_PROVIDER_SITE_OTHER): Payer: Medicare Other

## 2018-04-09 ENCOUNTER — Ambulatory Visit (INDEPENDENT_AMBULATORY_CARE_PROVIDER_SITE_OTHER): Payer: Medicare Other | Admitting: Neurology

## 2018-04-09 DIAGNOSIS — R202 Paresthesia of skin: Secondary | ICD-10-CM | POA: Diagnosis not present

## 2018-04-09 LAB — FOLATE: FOLATE: 20.8 ng/mL (ref >5.9)

## 2018-04-09 LAB — FERRITIN: FERRITIN: 92.8 ng/mL (ref 10.0–291.0)

## 2018-04-09 MED ORDER — GABAPENTIN 100 MG PO CAPS: ORAL_CAPSULE | ORAL | 5 refills | Status: DC

## 2018-04-09 NOTE — Patient Instructions (Addendum)
Check labs  NCS/EMG of the legs  Start gabapentin 100mg  - take 1 tablet at bedtime for one week, then increase to 1 tablet twice daily.  We will call you with the results

## 2018-04-09 NOTE — Progress Notes (Signed)
[facility] Clinic Note - Initial Visit   Date: 04/09/18  Elizabeth Ward MRN: [id number] DOB: 05-May-1946   Dear Dr. Burnice Logan:  Thank you for your kind referral of Elizabeth Ward for consultation of bilateral leg paresthesias. Although her history is well known to you, please allow [place] to reiterate it for the purpose of our medical record. The patient was accompanied to the clinic by self.   History of Present Illness: Elizabeth Ward is a 72 y.o. right-handed Caucasian female with anxiety/depression and insomnia presenting for evaluation of bilateral leg numbness/tingling.    Starting around February/March 2019, she began having numbness and tingling which started in feet and has steadily increased up to ankles, lower legs, and knees.  Symptoms are constant and exacerbated by prolonged standing or prolonged laying in her bed.  Resting can help sometimes.  She denies weakness of the legs, imbalance, or falls.   She tried soft tissue massage which provided some relief. She was started on gabapentin 300mg  at bedtime, which helped but it made her unsteady and sleepy, so discontinued it.  She gets cramps and muscle spasms of the legs.  TSH and vitamin B12 is normal.   She drinks wine 2-3 times per week, usually half a glass.   Out-side paper records, electronic medical record, and images have been reviewed where available and summarized as:  Lab Results  Component Value Date   TSH 1.62 01/29/2018   Lab Results  Component Value Date   [id number] 570 02/27/2018   MRI brain wo contrast 02/05/2010:  Normal noncontrast MRI appearance of the brain for age.   Past Medical History:  Diagnosis Date  . ANXIETY 08/14/2008  . INSOMNIA 09/07/2009  . Irritable bowel syndrome 09/07/2009  . MENOPAUSE, SURGICAL 08/14/2008  . Shingles   . UNSPECIFIED VAGINITIS AND VULVOVAGINITIS 06/04/2010    Past Surgical History:  Procedure Laterality Date  . ABDOMINAL HYSTERECTOMY    .  CATARACT EXTRACTION Right   . COLECTOMY     partial  . EYE SURGERY    . TONSILLECTOMY    . TONSILLECTOMY       Medications:  Outpatient Encounter Medications as of 04/09/2018  Medication Sig  . ALPRAZolam (XANAX) 0.25 MG tablet Take 1 tablet (0.25 mg total) by mouth 4 (four) times daily.  Marland Kitchen estradiol (ESTRACE) 0.1 MG/GM vaginal cream PLACE 1 APPLICATORFUL VAGINALLY 2 (TWO) TIMES A WEEK.  Marland Kitchen FLUoxetine (PROZAC) 20 MG tablet TAKE 1 TABLET BY MOUTH EVERY DAY  . fluticasone (FLONASE) 50 MCG/ACT nasal spray Place 2 sprays into both nostrils daily.  Marland Kitchen ibuprofen (ADVIL,MOTRIN) 600 MG tablet TAKE ONE TABLET BY MOUTH EVERY 6-8 HOURS AS NEEDED FOR PAIN  . PREMARIN 0.625 MG tablet TAKE 1 TABLET BY MOUTH DAILY X 21 DAYS THEN STOP X 7 DAYS  . traZODone (DESYREL) 50 MG tablet Take 25 mg by mouth at bedtime as needed.  . gabapentin (NEURONTIN) 100 MG capsule Take 1 tablet at bedtime for one week, then increase to 1 tablet twice daily.  . [DISCONTINUED] fluconazole (DIFLUCAN) 150 MG tablet Take one tablet now po, repeat in 5 days.  . [DISCONTINUED] gabapentin (NEURONTIN) 300 MG capsule Take 1 capsule (300 mg total) by mouth 2 (two) times daily. (Patient not taking: Reported on 04/02/2018)   No facility-administered encounter medications on file as of 04/09/2018.      Allergies:  Allergies  Allergen Reactions  . Prochlorperazine Edisylate Other (See Comments)    Tongue spasms(tongue would droop  back in to the throat   . Tape Rash    Family History: Family History  Problem Relation Age of Onset  . Hodgkin's lymphoma Mother   . Lung cancer Father   . Heart attack Son 18    Social History: Social History   Tobacco Use  . Smoking status: Never Smoker  . Smokeless tobacco: Never Used  Substance Use Topics  . Alcohol use: Yes    Alcohol/week: 7.0 standard drinks    Types: 7 Glasses of wine per week  . Drug use: No   Social History   Social History Narrative   Lives with husband and  grandson in a 2 story home.  Her son passed away at age 33 from a heart attack so she is raising her grandson.  She works as a Agricultural engineer.  Education: college.     Review of Systems:  CONSTITUTIONAL: No fevers, chills, night sweats, or weight loss.   EYES: No visual changes or eye pain ENT: No hearing changes.  No history of nose bleeds.   RESPIRATORY: No cough, wheezing and shortness of breath.   CARDIOVASCULAR: Negative for chest pain, and palpitations.   GI: Negative for abdominal discomfort, blood in stools or black stools.  No recent change in bowel habits.   GU:  No history of incontinence.   MUSCLOSKELETAL: No history of joint pain or swelling.  No myalgias.   SKIN: Negative for lesions, rash, and itching.   HEMATOLOGY/ONCOLOGY: Negative for prolonged bleeding, bruising easily, and swollen nodes.  No history of cancer.   ENDOCRINE: Negative for cold or heat intolerance, polydipsia or goiter.   PSYCH:  +depression or anxiety symptoms.   NEURO: As Above.   Vital Signs:  BP 110/70   Pulse 76   Ht 5' 2.5" (1.588 m)   Wt 113 lb 2 oz (51.3 kg)   SpO2 98%   BMI 20.36 kg/m    General Medical Exam:   General:  Well appearing, comfortable.   Eyes/ENT: see cranial nerve examination.   Neck: No masses appreciated.  Full range of motion without tenderness.  No carotid bruits. Respiratory:  Clear to auscultation, good air entry bilaterally.   Cardiac:  Regular rate and rhythm, no murmur.   Extremities:  No deformities, edema, or skin discoloration.  Skin:  No rashes or lesions.  Neurological Exam: MENTAL STATUS including orientation to time, place, person, recent and remote memory, attention span and concentration, language, and fund of knowledge is normal.  Speech is not dysarthric.  CRANIAL NERVES: II:  No visual field defects.  Unremarkable fundi.   III-IV-VI: Pupils equal round and reactive to light.  Normal conjugate, extra-ocular eye movements in all directions of gaze.  No  nystagmus.  No ptosis.   V:  Normal facial sensation.    VII:  Normal facial symmetry and movements.   VIII:  Normal hearing and vestibular function.   IX-X:  Normal palatal movement.   XI:  Normal shoulder shrug and head rotation.   XII:  Normal tongue strength and range of motion, no deviation or fasciculation.  MOTOR:  No atrophy, fasciculations or abnormal movements.  No pronator drift.  Tone is normal.    Right Upper Extremity:    Left Upper Extremity:    Deltoid  5/5   Deltoid  5/5   Biceps  5/5   Biceps  5/5   Triceps  5/5   Triceps  5/5   Wrist extensors  5/5   Wrist extensors  5/5   Wrist flexors  5/5   Wrist flexors  5/5   Finger extensors  5/5   Finger extensors  5/5   Finger flexors  5/5   Finger flexors  5/5   Dorsal interossei  5/5   Dorsal interossei  5/5   Abductor pollicis  5/5   Abductor pollicis  5/5   Tone (Ashworth scale)  0  Tone (Ashworth scale)  0   Right Lower Extremity:    Left Lower Extremity:    Hip flexors  5/5   Hip flexors  5/5   Hip extensors  5/5   Hip extensors  5/5   Knee flexors  5/5   Knee flexors  5/5   Knee extensors  5/5   Knee extensors  5/5   Dorsiflexors  5/5   Dorsiflexors  5/5   Plantarflexors  5/5   Plantarflexors  5/5   Toe extensors  5/5   Toe extensors  5/5   Toe flexors  5/5   Toe flexors  5/5   Tone (Ashworth scale)  0  Tone (Ashworth scale)  0   MSRs:  Right                                                                 Left brachioradialis 2+  brachioradialis 2+  biceps 2+  biceps 2+  triceps 2+  triceps 2+  patellar 2+  patellar 2+  ankle jerk 2+  ankle jerk 2+  Hoffman no  Hoffman no  plantar response down  plantar response down   SENSORY:  Normal and symmetric perception of light touch, pinprick, vibration, and proprioception.  Romberg's sign absent.   COORDINATION/GAIT: Normal finger-to- nose-finger and heel-to-shin.  Intact rapid alternating movements bilaterally.  Able to rise from a chair without using arms.   Gait narrow based and stable. Tandem and stressed gait intact.    IMPRESSION: Elizabeth Ward is a 72 year-old female referred for evaluation of bilateral leg paresthesias.  Her exam is entirely normal and non-focal.  Distribution of paresthesias raises possibility of neuropathy, but I would expect diminished reflexes and distal sensory changes, which she does not have.  No signs of lumbar canal or foraminal stenosis.  TSH and vitamin B12 is also normal.  Proceed with NCS/EMG of the legs to characterize the nature of her symptoms.  Check folate, vitamin B1, ferritin.  Doubt RLS, but this can be considered going forward if testing is negative.  Start gabapentin 100mg  at bedtime for one week, then increase gabapentin 100mg  twice daily.  If she does not tolerate this, consider nortriptyline.  Further recommendations based on results  Thank you for allowing me to participate in patient's care.  If I can answer any additional questions, I would be pleased to do so.    Sincerely,    Shadae Reino K. Posey Pronto, DO

## 2018-04-09 NOTE — Telephone Encounter (Signed)
Dr.Patel office called and wanted to know if it was okay to do a consultation before given the pt a nerve conduction study. This is Okay per dr.Kwiatkowski. No further action needed!

## 2018-04-11 ENCOUNTER — Other Ambulatory Visit: Payer: Self-pay | Admitting: Internal Medicine

## 2018-04-11 ENCOUNTER — Telehealth: Payer: Self-pay | Admitting: Internal Medicine

## 2018-04-11 MED ORDER — ALPRAZOLAM 0.25 MG PO TABS: 0.2500 mg | ORAL_TABLET | Freq: Four times a day (QID) | ORAL | 0 refills | Status: DC

## 2018-04-11 NOTE — Telephone Encounter (Signed)
Pt is wanting all Rx refilled Pt just picked her Alprazolam up and is afraid she will run out before Dr.Hernandez gets here. Is this okay to fill and add to sig to refill in a month? Please advise

## 2018-04-11 NOTE — Telephone Encounter (Signed)
Copied from [place] [id number]. Topic: General - Other >> Apr 11, 2018 11:52 AM Carolyn Stare wrote:  Pt call asking questions about how her refills are going to be handled since Dr Raliegh Ip is leaving and is asking for a call back

## 2018-04-14 LAB — VITAMIN B1: VITAMIN B1 (THIAMINE): 17 nmol/L (ref 8–30)

## 2018-04-18 ENCOUNTER — Telehealth: Payer: Self-pay | Admitting: Neurology

## 2018-04-18 NOTE — Telephone Encounter (Signed)
Patient is calling in stating that it was time for her to up her dosage of the gabapentin and she had some concerns about that. She is having some side effects: difficulty concentrating and focusing, very tired and some dizziness. She is having some relief but not all. Please call her back at [phone]. Thanks!

## 2018-04-19 ENCOUNTER — Other Ambulatory Visit: Payer: Self-pay | Admitting: *Deleted

## 2018-04-19 DIAGNOSIS — R200 Anesthesia of skin: Secondary | ICD-10-CM

## 2018-04-19 DIAGNOSIS — R202 Paresthesia of skin: Secondary | ICD-10-CM

## 2018-04-19 MED ORDER — NORTRIPTYLINE HCL 10 MG PO CAPS: 10.0000 mg | ORAL_CAPSULE | Freq: Every day | ORAL | 1 refills | Status: DC

## 2018-04-19 NOTE — Telephone Encounter (Signed)
Patient informed that we will start her on nortriptyline 10 mg qhs per Dr. Posey Pronto.  Rx sent.

## 2018-05-02 ENCOUNTER — Other Ambulatory Visit: Payer: Self-pay | Admitting: Neurology

## 2018-05-07 ENCOUNTER — Encounter: Payer: Medicare Other | Admitting: Neurology

## 2018-05-07 ENCOUNTER — Encounter

## 2018-05-30 ENCOUNTER — Ambulatory Visit: Payer: Medicare Other | Admitting: Neurology

## 2018-05-30 DIAGNOSIS — IMO0001 Reserved for inherently not codable concepts without codable children: Secondary | ICD-10-CM

## 2018-05-30 DIAGNOSIS — R209 Unspecified disturbances of skin sensation: Principal | ICD-10-CM

## 2018-05-30 NOTE — Progress Notes (Signed)
Patient arrived for NCS/EMG of the legs and was unable to tolerate testing so it was stopped within the first few attempts of nerve conduction testing.  Marlon Suleiman K. Posey Pronto, DO

## 2018-05-31 ENCOUNTER — Other Ambulatory Visit: Payer: Self-pay | Admitting: *Deleted

## 2018-05-31 DIAGNOSIS — R209 Unspecified disturbances of skin sensation: Principal | ICD-10-CM

## 2018-05-31 DIAGNOSIS — R200 Anesthesia of skin: Secondary | ICD-10-CM

## 2018-05-31 DIAGNOSIS — R202 Paresthesia of skin: Secondary | ICD-10-CM

## 2018-05-31 DIAGNOSIS — IMO0001 Reserved for inherently not codable concepts without codable children: Secondary | ICD-10-CM

## 2018-06-01 ENCOUNTER — Telehealth: Payer: Self-pay | Admitting: Internal Medicine

## 2018-06-05 NOTE — Telephone Encounter (Signed)
Tried to call pt to informed her of quantity change. Pt will need to get other refills from new provider

## 2018-06-05 NOTE — Telephone Encounter (Signed)
Okay for refill? Please advise 

## 2018-06-06 NOTE — Telephone Encounter (Addendum)
Pt called stating she was disconnected. Per Tillie Rung (working out of [facility] today) she told pt she would call her back. I advised pt of this. She is very upset about getting reduced dose of medication. She doesn't understand 1. Why it was ordered by Dr. Sherren Mocha on his last day and for less than half her usual amount. 2. Why it wasn't ordered by new provider Dr. Jerilee Hoh. Pt has appt 07/05/18 and assumed refills would be in full until her appt. Pt said she'll be in the hospital if we don't send full dose. She said it's unethical to cut her dose and make her go cold [place].   I spoke with [facility] and advised pt that Dr. Ledell Noss first day is 06/07/18 and we can request her to bridge the patient's medication until her OV 07/05/18. Pt was advised to pick up the #50 doses from the pharmacy (12.5 day supply) and we will check with Dr. Jerilee Hoh and call her back to notify of decision.  No need for call back from [place] today.  Pt noted she is raising her 70 year old grandson and being under a lot of stress.  [id number] [place] [place] [address]

## 2018-06-06 NOTE — Telephone Encounter (Signed)
Noted  

## 2018-06-11 ENCOUNTER — Ambulatory Visit
Admission: RE | Admit: 2018-06-11 | Discharge: 2018-06-11 | Disposition: A | Discharge status: Home / Self Care | Payer: Medicare Other | Source: Ambulatory Visit | Attending: Neurology | Admitting: Neurology

## 2018-06-11 DIAGNOSIS — R209 Unspecified disturbances of skin sensation: Principal | ICD-10-CM

## 2018-06-11 DIAGNOSIS — M47816 Spondylosis without myelopathy or radiculopathy, lumbar region: Secondary | ICD-10-CM | POA: Diagnosis not present

## 2018-06-11 DIAGNOSIS — R202 Paresthesia of skin: Secondary | ICD-10-CM

## 2018-06-11 DIAGNOSIS — R200 Anesthesia of skin: Secondary | ICD-10-CM

## 2018-06-11 DIAGNOSIS — IMO0001 Reserved for inherently not codable concepts without codable children: Secondary | ICD-10-CM

## 2018-06-13 ENCOUNTER — Telehealth: Payer: Self-pay | Admitting: *Deleted

## 2018-06-13 ENCOUNTER — Encounter: Payer: Self-pay | Admitting: *Deleted

## 2018-06-13 NOTE — Telephone Encounter (Signed)
Results and instructions sent via My Chart.  

## 2018-06-13 NOTE — Telephone Encounter (Signed)
-----   Message from Alda Berthold, DO sent at 06/13/2018  8:54 AM EST ----- Please inform patient that her MRI lumbar spine looks great, only mild age-related changes, but no evidence of disc herniation or nerve impingement.  Recommend staying on notriptyline 10mg  at bedtime to see if her symptoms can be controlled on this. Thanks.

## 2018-06-14 ENCOUNTER — Other Ambulatory Visit: Payer: Self-pay | Admitting: Internal Medicine

## 2018-06-14 DIAGNOSIS — F411 Generalized anxiety disorder: Secondary | ICD-10-CM

## 2018-06-14 MED ORDER — ALPRAZOLAM 0.25 MG PO TABS: 0.2500 mg | ORAL_TABLET | Freq: Four times a day (QID) | ORAL | 0 refills | Status: DC

## 2018-06-14 NOTE — Telephone Encounter (Signed)
Will give enough until next appointment.

## 2018-06-14 NOTE — Telephone Encounter (Signed)
Can Jerilee Hoh bridge this patient until their appointment?

## 2018-06-14 NOTE — Telephone Encounter (Signed)
Requested medication (s) are due for refill today: yes  Requested medication (s) are on the active medication list: yes  Last refill:  06/05/18  Future visit scheduled: yes  Notes to clinic:  Last refill states no more until office visit. Scheduled 07/05/18    Requested Prescriptions  Pending Prescriptions Disp Refills   ALPRAZolam (XANAX) 0.25 MG tablet 50 tablet 0    Sig: Take 1 tablet (0.25 mg total) by mouth 4 (four) times daily.     Not Delegated - Psychiatry:  Anxiolytics/Hypnotics Failed - 06/14/2018  1:49 PM      Failed - This refill cannot be delegated      Failed - Urine Drug Screen completed in last 360 days.      Passed - Valid encounter within last 6 months    Recent Outpatient Visits          2 months ago [place] at [facility], Doretha Sou, MD   3 months ago [place] at [facility], Doretha Sou, MD   4 months ago Anxiety Research officer, political party at [facility], Doretha Sou, MD   11 months ago Female cystocele   Therapist, music at [facility], Doretha Sou, MD   1 year ago Anxiety state   [place] at [facility], Doretha Sou, MD      Future Appointments            In 3 weeks Isaac Bliss, Rayford Halsted, MD [place] [facility] [place], [facility]

## 2018-06-14 NOTE — Telephone Encounter (Unsigned)
Copied from [place] [id number]. Topic: Quick Communication - Rx Refill/Question >> Jun 14, 2018 12:45 PM Yvette Rack wrote: Pt states Dr. Sherren Mocha only did a partial refill of the ALPRAZolam (XANAX) 0.25 MG tablet. Pt states she is almost out and her appt with Dr. Isaac Bliss isn't until 07/05/18 at 11 am.  Medication: ALPRAZolam Duanne Moron) 0.25 MG tablet  Has the patient contacted their pharmacy? yes   Preferred Pharmacy (with phone number or street name): [id number] [place], [address] [phone] (Phone) [phone] (Fax)  Agent: Please be advised that RX refills may take up to 3 business days. We ask that you follow-up with your pharmacy.

## 2018-07-05 ENCOUNTER — Encounter: Payer: Self-pay | Admitting: Internal Medicine

## 2018-07-05 ENCOUNTER — Ambulatory Visit (INDEPENDENT_AMBULATORY_CARE_PROVIDER_SITE_OTHER): Payer: Medicare Other | Admitting: Internal Medicine

## 2018-07-05 VITALS — BP 110/78 | HR 91 | Temp 97.8°F | Ht 62.5 in | Wt 114.8 lb

## 2018-07-05 DIAGNOSIS — R200 Anesthesia of skin: Secondary | ICD-10-CM | POA: Diagnosis not present

## 2018-07-05 DIAGNOSIS — F5101 Primary insomnia: Secondary | ICD-10-CM

## 2018-07-05 DIAGNOSIS — F411 Generalized anxiety disorder: Secondary | ICD-10-CM

## 2018-07-05 DIAGNOSIS — Z23 Encounter for immunization: Secondary | ICD-10-CM | POA: Diagnosis not present

## 2018-07-05 DIAGNOSIS — E8941 Symptomatic postprocedural ovarian failure: Secondary | ICD-10-CM | POA: Diagnosis not present

## 2018-07-05 DIAGNOSIS — R202 Paresthesia of skin: Secondary | ICD-10-CM | POA: Diagnosis not present

## 2018-07-05 MED ORDER — FLUOXETINE HCL 20 MG PO TABS: 10.0000 mg | ORAL_TABLET | Freq: Every day | ORAL | 2 refills | Status: DC

## 2018-07-05 MED ORDER — NORTRIPTYLINE HCL 10 MG PO CAPS: 10.0000 mg | ORAL_CAPSULE | Freq: Every day | ORAL | 1 refills | Status: DC

## 2018-07-05 MED ORDER — ESTROGENS CONJUGATED 0.625 MG PO TABS: ORAL_TABLET | ORAL | 1 refills | Status: DC

## 2018-07-05 MED ORDER — ESTRADIOL 0.1 MG/GM VA CREA: 1.0000 | TOPICAL_CREAM | VAGINAL | 0 refills | Status: DC

## 2018-07-05 MED ORDER — ALPRAZOLAM 0.25 MG PO TABS: 0.2500 mg | ORAL_TABLET | Freq: Four times a day (QID) | ORAL | 0 refills | Status: DC

## 2018-07-05 NOTE — Progress Notes (Signed)
Established Patient Office Visit     CC/Reason for Visit: Establish care, medication refills, follow-up on chronic medical conditions  HPI: Elizabeth Ward is a 72 y.o. female who is coming in today for the above mentioned reasons.  Due for annual wellness exam in July 2020.  She and her husband have legal custody of their 45 year old grandson after their son passed away 6 years ago.  She has had a lot of anxiety and depression surrounding this issue.  She has been on Xanax 0.25 mg that she takes 4 times a day routinely.  When she has tried to get off the Ativan she has noted extreme anxiety in dealing with a multitude of issues that are related to her grandson including homework.  She functions well on the Ativan, never has increased somnolence.  She takes fluoxetine 20 mg half a tablet daily for her depression.  She also takes trazodone for insomnia and gets about 4 to 6 hours of sleep with this.  She is also prescribed estradiol cream and Premarin for postmenopausal symptoms.  Over the past 6 months has developed some lower extremity paresthesias is has been seen by neurology with no diagnosis yet.  She cannot tolerate Neurontin due to dizziness and somnolence and so is now on nortriptyline 10 mg at bedtime with fairly good results.  She is seeing Dr. Posey Pronto, neurology.  She has no acute complaints at today's visit, she is requesting her medications to be refilled.   Past Medical/Surgical History: Past Medical History:  Diagnosis Date  . ANXIETY 08/14/2008  . INSOMNIA 09/07/2009  . Irritable bowel syndrome 09/07/2009  . MENOPAUSE, SURGICAL 08/14/2008  . Shingles   . UNSPECIFIED VAGINITIS AND VULVOVAGINITIS 06/04/2010    Past Surgical History:  Procedure Laterality Date  . ABDOMINAL HYSTERECTOMY    . CATARACT EXTRACTION Right   . COLECTOMY     partial  . EYE SURGERY    . TONSILLECTOMY    . TONSILLECTOMY      Social History:  reports that she has never smoked. She has never used  smokeless tobacco. She reports current alcohol use of about 7.0 standard drinks of alcohol per week. She reports that she does not use drugs.  Allergies: Allergies  Allergen Reactions  . Prochlorperazine Edisylate Other (See Comments)    Tongue spasms(tongue would droop back in to the throat   . Tape Rash    Family History:  Family History  Problem Relation Age of Onset  . Hodgkin's lymphoma Mother   . Lung cancer Father   . Heart attack Son 6     Current Outpatient Medications:  .  ALPRAZolam (XANAX) 0.25 MG tablet, Take 1 tablet (0.25 mg total) by mouth 4 (four) times daily., Disp: 120 tablet, Rfl: 0 .  [START ON 07/08/2018] estradiol (ESTRACE) 0.1 MG/GM vaginal cream, Place 1 Applicatorful vaginally 2 (two) times a week., Disp: 127.5 g, Rfl: 0 .  estrogens, conjugated, (PREMARIN) 0.625 MG tablet, TAKE 1 TABLET BY MOUTH DAILY X 21 DAYS THEN STOP X 7 DAYS, Disp: 90 tablet, Rfl: 1 .  FLUoxetine (PROZAC) 20 MG tablet, Take 0.5 tablets (10 mg total) by mouth daily., Disp: 15 tablet, Rfl: 2 .  fluticasone (FLONASE) 50 MCG/ACT nasal spray, Place 2 sprays into both nostrils daily., Disp: 16 g, Rfl: 6 .  ibuprofen (ADVIL,MOTRIN) 600 MG tablet, TAKE ONE TABLET BY MOUTH EVERY 6-8 HOURS AS NEEDED FOR PAIN, Disp: , Rfl: 0 .  nortriptyline (PAMELOR) 10 MG capsule,  Take 1 capsule (10 mg total) by mouth at bedtime., Disp: 90 capsule, Rfl: 1 .  traZODone (DESYREL) 50 MG tablet, Take 25 mg by mouth at bedtime as needed., Disp: , Rfl: 1  Review of Systems:  Constitutional: Denies fever, chills, diaphoresis, appetite change and fatigue.  HEENT: Denies photophobia, eye pain, redness, hearing loss, ear pain, congestion, sore throat, rhinorrhea, sneezing, mouth sores, trouble swallowing, neck pain, neck stiffness and tinnitus.   Respiratory: Denies SOB, DOE, cough, chest tightness,  and wheezing.   Cardiovascular: Denies chest pain, palpitations and leg swelling.  Gastrointestinal: Denies nausea,  vomiting, abdominal pain, diarrhea, constipation, blood in stool and abdominal distention.  Genitourinary: Denies dysuria, urgency, frequency, hematuria, flank pain and difficulty urinating.  Endocrine: Denies: hot or cold intolerance, sweats, changes in hair or nails, polyuria, polydipsia. Musculoskeletal: Denies myalgias, back pain, joint swelling, arthralgias and gait problem.  Skin: Denies pallor, rash and wound.  Neurological: Denies dizziness, seizures, syncope, weakness, light-headedness, numbness and headaches.  Hematological: Denies adenopathy. Easy bruising, personal or family bleeding history  Psychiatric/Behavioral: Denies suicidal ideation, mood changes, confusion, nervousness, sleep disturbance and agitation    Physical Exam: Vitals:   07/05/18 1112  BP: 110/78  Pulse: 91  Temp: 97.8 F (36.6 C)  TempSrc: Oral  Weight: 114 lb 12.8 oz (52.1 kg)  Height: 5' 2.5" (1.588 m)    Body mass index is 20.66 kg/m.   Constitutional: NAD, calm, comfortable Eyes: PERRL, lids and conjunctivae normal ENMT: Mucous membranes are moist. Neck: normal, supple, no masses, no thyromegaly Respiratory: clear to auscultation bilaterally, no wheezing, no crackles. Normal respiratory effort. No accessory muscle use.  Cardiovascular: Regular rate and rhythm, no murmurs / rubs / gallops. No extremity edema. 2+ pedal pulses. No carotid bruits.  Musculoskeletal: no clubbing / cyanosis. No joint deformity upper and lower extremities. Good ROM, no contractures. Normal muscle tone.  Skin: no rashes, lesions, ulcers. No induration Neurologic: Grossly intact and nonfocal Psychiatric: Normal judgment and insight. Alert and oriented x 3. Normal mood.    Impression and Plan:  Anxiety state -We will refill Xanax and fluoxetine today.  Primary insomnia -On trazodone, tolerates this well.  Paresthesia of both lower extremities -Still without a diagnosis, follows with neurology. -Did not tolerate  Neurontin, is now on nortriptyline with fair response.  MENOPAUSE, SURGICAL - Plan: estrogens, conjugated, (PREMARIN) 0.625 MG tablet, estradiol (ESTRACE) 0.1 MG/GM vaginal cream.  These have been refilled today  Needs flu shot - Plan: Flu vaccine HIGH DOSE PF (Fluzone High dose)    Patient Instructions  -It was nice meeting you today!  -Flu vaccine today.  -Schedule followup in July 2020 for you annual wellness visit. Please come fasting at that time for lab work.     Lelon Frohlich, MD [facility]

## 2018-07-05 NOTE — Patient Instructions (Signed)
-  It was nice meeting you today!  -Flu vaccine today.  -Schedule followup in July 2020 for you annual wellness visit. Please come fasting at that time for lab work.

## 2018-07-27 ENCOUNTER — Other Ambulatory Visit: Payer: Self-pay | Admitting: Internal Medicine

## 2018-07-27 DIAGNOSIS — F411 Generalized anxiety disorder: Secondary | ICD-10-CM

## 2018-08-02 ENCOUNTER — Other Ambulatory Visit: Payer: Self-pay | Admitting: Internal Medicine

## 2018-08-02 DIAGNOSIS — F411 Generalized anxiety disorder: Secondary | ICD-10-CM

## 2018-08-02 MED ORDER — ALPRAZOLAM 0.25 MG PO TABS: 0.2500 mg | ORAL_TABLET | Freq: Four times a day (QID) | ORAL | 2 refills | Status: DC

## 2018-08-02 NOTE — Telephone Encounter (Signed)
Requested medication (s) are due for refill today: Yes  Requested medication (s) are on the active medication list: Yes  Last refill:  07/05/18  Future visit scheduled: No  Notes to clinic:  See request    Requested Prescriptions  Pending Prescriptions Disp Refills   ALPRAZolam (XANAX) 0.25 MG tablet 120 tablet 0    Sig: Take 1 tablet (0.25 mg total) by mouth 4 (four) times daily.     Not Delegated - Psychiatry:  Anxiolytics/Hypnotics Failed - 08/02/2018  9:30 AM      Failed - This refill cannot be delegated      Failed - Urine Drug Screen completed in last 360 days.      Passed - Valid encounter within last 6 months    Recent Outpatient Visits          4 weeks ago Anxiety state   [facility], MD   4 months ago [facility], Doretha Sou, MD   5 months ago [place] [facility], Doretha Sou, MD   6 months ago Anxiety Research officer, political party at [facility], Doretha Sou, MD   1 year ago Female cystocele   [facility], Doretha Sou, MD

## 2018-08-02 NOTE — Telephone Encounter (Signed)
Copied from [place] [id number]. Topic: Quick Communication - Rx Refill/Question >> Aug 02, 2018  9:23 AM Carolyn Stare wrote: Medication ALPRAZolam Duanne Moron) 0.25 MG tablet    Preferred Pharmacy    [facility]   Agent: Please be advised that RX refills may take up to 3 business days. We ask that you follow-up with your pharmacy.

## 2018-08-28 ENCOUNTER — Other Ambulatory Visit: Payer: Self-pay | Admitting: Internal Medicine

## 2018-09-20 NOTE — Telephone Encounter (Signed)
Requested medication (s) are due for refill today: yes  Requested medication (s) are on the active medication list: yes  Last refill:  04/09/18  Future visit scheduled: no  Notes to clinic:  Historical provider   Requested Prescriptions  Pending Prescriptions Disp Refills   traZODone (DESYREL) 50 MG tablet [Pharmacy Med Name: TRAZODONE 50 MG TABLET] 45 tablet 1    Sig: TAKE 1/2 TABLET BY MOUTH AT BEDTIME AS NEEDED     Psychiatry: Antidepressants - Serotonin Modulator Passed - 09/20/2018 12:14 PM      Passed - Valid encounter within last 6 months    Recent Outpatient Visits          2 months ago Anxiety state   [facility], MD   5 months ago [facility], Doretha Sou, MD   6 months ago [place] [facility], Doretha Sou, MD   7 months ago Anxiety Research officer, political party at [facility], Doretha Sou, MD   1 year ago Female cystocele   [facility], Doretha Sou, MD

## 2018-09-20 NOTE — Telephone Encounter (Signed)
Pt called to follow up on refill for traZODone (DESYREL) 50 MG tablet  She stated the pharmacy has faxed over several requests since 09/16/18. Pt is out of medication. Please advise.  [id number] [place] [place] [facility] [place] [phone] (Phone) [phone] (Fax)

## 2018-09-23 NOTE — Telephone Encounter (Signed)
Patient is calling to check on status of Dr. Jerilee Hoh calling in Trazodone. Explained this was delayed due to pharmacy sending to Dr. Raliegh Ip pool.

## 2018-09-25 MED ORDER — TRAZODONE HCL 50 MG PO TABS: 25.0000 mg | ORAL_TABLET | Freq: Every evening | ORAL | 1 refills | Status: DC | PRN

## 2018-09-25 NOTE — Addendum Note (Signed)
Addended by: Westley Hummer B on: 09/25/2018 09:20 AM   Modules accepted: Orders

## 2018-11-11 ENCOUNTER — Telehealth: Payer: Self-pay | Admitting: Internal Medicine

## 2018-11-11 NOTE — Telephone Encounter (Signed)
Copied from [place] [id number]. Topic: Quick Communication - See Telephone Encounter >> Nov 11, 2018  2:26 PM Antonieta Iba C wrote: CRM for notification. See Telephone encounter for: 11/11/18.  Pt is calling in to follow up on her refill request for ALPRAZolam (XANAX) 0.25 MG tablet. Pt says that she called it in to the pharmacy days ago.   Please assist.    [id number] [place], [place] [phone] (Phone) [phone] (Fax)

## 2018-11-12 ENCOUNTER — Ambulatory Visit (INDEPENDENT_AMBULATORY_CARE_PROVIDER_SITE_OTHER): Payer: Medicare Other | Admitting: Internal Medicine

## 2018-11-12 ENCOUNTER — Ambulatory Visit: Payer: Medicare Other | Admitting: Internal Medicine

## 2018-11-12 ENCOUNTER — Other Ambulatory Visit: Payer: Self-pay

## 2018-11-12 DIAGNOSIS — F411 Generalized anxiety disorder: Secondary | ICD-10-CM

## 2018-11-12 MED ORDER — ALPRAZOLAM 0.25 MG PO TABS: 0.2500 mg | ORAL_TABLET | Freq: Four times a day (QID) | ORAL | 2 refills | Status: DC

## 2018-11-12 NOTE — Telephone Encounter (Signed)
Visit for medication refills please.

## 2018-11-12 NOTE — Telephone Encounter (Signed)
Doxy.me appointment made for 11/12/2018

## 2018-11-12 NOTE — Progress Notes (Signed)
Virtual Visit via Video Note  I connected with Elizabeth Ward on 11/12/18 at  3:30 PM EDT by a video enabled telemedicine application and verified that I am speaking with the correct person using two identifiers.  Location patient: home Location provider: work office Persons participating in the virtual visit: patient, provider  I discussed the limitations of evaluation and management by telemedicine and the availability of in person appointments. The patient expressed understanding and agreed to proceed.   HPI: This is a visit she has scheduled for refills of alprazolam per contract. She is due for 3 mo refills. Has had no changes in the past 3 months. Feels well otherwise.   ROS: Constitutional: Denies fever, chills, diaphoresis, appetite change and fatigue.  HEENT: Denies photophobia, eye pain, redness, hearing loss, ear pain, congestion, sore throat, rhinorrhea, sneezing, mouth sores, trouble swallowing, neck pain, neck stiffness and tinnitus.   Respiratory: Denies SOB, DOE, cough, chest tightness,  and wheezing.   Cardiovascular: Denies chest pain, palpitations and leg swelling.  Gastrointestinal: Denies nausea, vomiting, abdominal pain, diarrhea, constipation, blood in stool and abdominal distention.  Genitourinary: Denies dysuria, urgency, frequency, hematuria, flank pain and difficulty urinating.  Endocrine: Denies: hot or cold intolerance, sweats, changes in hair or nails, polyuria, polydipsia. Musculoskeletal: Denies myalgias, back pain, joint swelling, arthralgias and gait problem.  Skin: Denies pallor, rash and wound.  Neurological: Denies dizziness, seizures, syncope, weakness, light-headedness, numbness and headaches.  Hematological: Denies adenopathy. Easy bruising, personal or family bleeding history  Psychiatric/Behavioral: Denies suicidal ideation, mood changes, confusion, nervousness, sleep disturbance and agitation   Past Medical History:  Diagnosis Date  .  ANXIETY 08/14/2008  . INSOMNIA 09/07/2009  . Irritable bowel syndrome 09/07/2009  . MENOPAUSE, SURGICAL 08/14/2008  . Shingles   . UNSPECIFIED VAGINITIS AND VULVOVAGINITIS 06/04/2010    Past Surgical History:  Procedure Laterality Date  . ABDOMINAL HYSTERECTOMY    . CATARACT EXTRACTION Right   . COLECTOMY     partial  . EYE SURGERY    . TONSILLECTOMY    . TONSILLECTOMY      Family History  Problem Relation Age of Onset  . Hodgkin's lymphoma Mother   . Lung cancer Father   . Heart attack Son 66    SOCIAL HX:   reports that she has never smoked. She has never used smokeless tobacco. She reports current alcohol use of about 7.0 standard drinks of alcohol per week. She reports that she does not use drugs.   Current Outpatient Medications:  .  ALPRAZolam (XANAX) 0.25 MG tablet, Take 1 tablet (0.25 mg total) by mouth 4 (four) times daily., Disp: 120 tablet, Rfl: 2 .  estradiol (ESTRACE) 0.1 MG/GM vaginal cream, Place 1 Applicatorful vaginally 2 (two) times a week., Disp: 127.5 g, Rfl: 0 .  estrogens, conjugated, (PREMARIN) 0.625 MG tablet, TAKE 1 TABLET BY MOUTH DAILY X 21 DAYS THEN STOP X 7 DAYS, Disp: 90 tablet, Rfl: 1 .  FLUoxetine (PROZAC) 20 MG tablet, TAKE 1/2 TABLET BY MOUTH DAILY, Disp: 45 tablet, Rfl: 1 .  fluticasone (FLONASE) 50 MCG/ACT nasal spray, Place 2 sprays into both nostrils daily., Disp: 16 g, Rfl: 6 .  ibuprofen (ADVIL,MOTRIN) 600 MG tablet, TAKE ONE TABLET BY MOUTH EVERY 6-8 HOURS AS NEEDED FOR PAIN, Disp: , Rfl: 0 .  nortriptyline (PAMELOR) 10 MG capsule, Take 1 capsule (10 mg total) by mouth at bedtime., Disp: 90 capsule, Rfl: 1 .  traZODone (DESYREL) 50 MG tablet, Take 0.5 tablets (25  mg total) by mouth at bedtime as needed., Disp: 45 tablet, Rfl: 1  EXAM:   VITALS per patient if applicable: none reported  GENERAL: alert, oriented, appears well and in no acute distress  HEENT: atraumatic, conjunttiva clear, no obvious abnormalities on inspection of  external nose and ears  NECK: normal movements of the head and neck  LUNGS: on inspection no signs of respiratory distress, breathing rate appears normal, no obvious gross increased work of breathing, gasping or wheezing  CV: no obvious cyanosis  MS: moves all visible extremities without noticeable abnormality  PSYCH/NEURO: pleasant and cooperative, no obvious depression or anxiety, speech and thought processing grossly intact  ASSESSMENT AND PLAN:   Anxiety state  -PDMP reviewed, no red flags, I am sole prescriber. -Will refill for 3 months #120 tabs (long-term Rx).    I discussed the assessment and treatment plan with the patient. The patient was provided an opportunity to ask questions and all were answered. The patient agreed with the plan and demonstrated an understanding of the instructions.   The patient was advised to call back or seek an in-person evaluation if the symptoms worsen or if the condition fails to improve as anticipated.    Lelon Frohlich, MD  [facility]

## 2019-01-16 ENCOUNTER — Other Ambulatory Visit: Payer: Self-pay | Admitting: Internal Medicine

## 2019-01-16 DIAGNOSIS — E8941 Symptomatic postprocedural ovarian failure: Secondary | ICD-10-CM

## 2019-01-28 DIAGNOSIS — H4720 Unspecified optic atrophy: Secondary | ICD-10-CM | POA: Diagnosis not present

## 2019-01-28 DIAGNOSIS — H34211 Partial retinal artery occlusion, right eye: Secondary | ICD-10-CM | POA: Diagnosis not present

## 2019-01-28 DIAGNOSIS — H43392 Other vitreous opacities, left eye: Secondary | ICD-10-CM | POA: Diagnosis not present

## 2019-01-28 DIAGNOSIS — H2512 Age-related nuclear cataract, left eye: Secondary | ICD-10-CM | POA: Diagnosis not present

## 2019-01-29 ENCOUNTER — Encounter: Payer: Self-pay | Admitting: Family Medicine

## 2019-01-29 ENCOUNTER — Ambulatory Visit (INDEPENDENT_AMBULATORY_CARE_PROVIDER_SITE_OTHER): Payer: Medicare Other | Admitting: Family Medicine

## 2019-01-29 VITALS — BP 120/70 | HR 78 | Temp 97.7°F | Wt 117.0 lb

## 2019-01-29 DIAGNOSIS — R002 Palpitations: Secondary | ICD-10-CM | POA: Diagnosis not present

## 2019-01-29 DIAGNOSIS — G453 Amaurosis fugax: Secondary | ICD-10-CM | POA: Diagnosis not present

## 2019-01-29 LAB — CBC WITH DIFFERENTIAL/PLATELET
Basophils Absolute: 0.1 10*3/uL (ref 0.0–0.1)
Basophils Relative: 0.9 % (ref 0.0–3.0)
Eosinophils Absolute: 0 10*3/uL (ref 0.0–0.7)
Eosinophils Relative: 0.6 % (ref 0.0–5.0)
HCT: 40.4 % (ref 36.0–46.0)
Hemoglobin: 13.3 g/dL (ref 12.0–15.0)
Lymphocytes Relative: 19.3 % (ref 12.0–46.0)
Lymphs Abs: 1.3 10*3/uL (ref 0.7–4.0)
MCHC: 32.9 g/dL (ref 30.0–36.0)
MCV: 88.2 fl (ref 78.0–100.0)
Monocytes Absolute: 0.6 10*3/uL (ref 0.1–1.0)
Monocytes Relative: 9.6 % (ref 3.0–12.0)
Neutro Abs: 4.7 10*3/uL (ref 1.4–7.7)
Neutrophils Relative %: 69.6 % (ref 43.0–77.0)
Platelets: 268 10*3/uL (ref 150.0–400.0)
RBC: 4.58 Mil/uL (ref 3.87–5.11)
RDW: 13.9 % (ref 11.5–15.5)
WBC: 6.8 10*3/uL (ref 4.0–10.5)

## 2019-01-29 LAB — SEDIMENTATION RATE: Sed Rate: 8 mm/hr (ref 0–30)

## 2019-01-29 LAB — C-REACTIVE PROTEIN: CRP: 1 mg/dL (ref 0.5–20.0)

## 2019-01-29 MED ORDER — ASPIRIN EC 81 MG PO TBEC: 81.0000 mg | DELAYED_RELEASE_TABLET | Freq: Every day | ORAL | 0 refills | Status: DC

## 2019-01-29 NOTE — Progress Notes (Signed)
Subjective:    Patient ID: Elizabeth Ward, female    DOB: [birth year], 73 y.o.   MRN: [id number]  HPI Here to discuss a case of what sounds like amaurosis fugax. One week ago while she was grocery shopping she suddenly saw a black spot with a red circle around the perimeter come up into her right visual field. The remainder of her visual field around this spot was fine. The spot persisted about 30-40 seconds and then moved back down out of sight. There was no discomfort during this time and she had no other symptoms. Since then she has felt fine. No other neurologic deficits. She saw Dr. Deloria Lair late yesterday and told him about this, and he immediately became concerned. He spoke to me on the phone early this morning and described her exam. He says the right retinal artery appears to be slightly occluded, and he could completely block the flow of blood through this by gently applying pressure the eye globe. Upon releasing pressure the blood flow returned at the original low level. He had told her yesterday to take two tablets of 81 mg aspirin and to take one tablet today, which she has done. Today she feels fine, but she does mention feeling brief episodes of fluttering in the chest over the past 5 years. These are brief and not associated with exertion. They are never accompanied by chest pain or SOB.    Review of Systems  Constitutional: Negative.   HENT: Negative.   Eyes: Positive for visual disturbance. Negative for photophobia, pain, discharge, redness and itching.  Respiratory: Negative.   Cardiovascular: Positive for palpitations. Negative for chest pain and leg swelling.  Neurological: Negative.        Objective:   Physical Exam Constitutional:      General: She is not in acute distress.    Appearance: Normal appearance.  HENT:     Head: Normocephalic and atraumatic.     Right Ear: Tympanic membrane and ear canal normal.     Left Ear: Tympanic membrane and ear canal normal.   Nose: Nose normal.     Mouth/Throat:     Pharynx: Oropharynx is clear.  Eyes:     Extraocular Movements: Extraocular movements intact.     Pupils: Pupils are equal, round, and reactive to light.  Neck:     Musculoskeletal: No neck rigidity.  Cardiovascular:     Rate and Rhythm: Normal rate and regular rhythm.     Pulses: Normal pulses.     Heart sounds: Normal heart sounds.     Comments: EKG today is normal  Pulmonary:     Effort: Pulmonary effort is normal.     Breath sounds: Normal breath sounds.  Lymphadenopathy:     Cervical: No cervical adenopathy.  Neurological:     General: No focal deficit present.     Mental Status: She is alert and oriented to person, place, and time.     Cranial Nerves: No cranial nerve deficit.     Sensory: No sensory deficit.     Motor: No weakness.     Coordination: Coordination normal.     Gait: Gait normal.           Assessment & Plan:  She had one instance of amaurosis fugax and has evidence of a partially occluded right retinal artery per Dr. Zadie Rhine. We will get labs today including a CBC and inflammatory markers. We will set up carotid dopplers and an ECHO asap. She will  continue to take an 81 mg aspirin daily. She knows to go to the ER if anything changes.  Alysia Penna, MD

## 2019-01-30 ENCOUNTER — Other Ambulatory Visit: Payer: Self-pay

## 2019-01-30 ENCOUNTER — Encounter: Payer: Self-pay | Admitting: *Deleted

## 2019-01-30 ENCOUNTER — Ambulatory Visit (HOSPITAL_COMMUNITY)
Admission: RE | Admit: 2019-01-30 | Discharge: 2019-01-30 | Disposition: A | Discharge status: Home / Self Care | Payer: Medicare Other | Source: Ambulatory Visit | Attending: Cardiology | Admitting: Cardiology

## 2019-01-30 ENCOUNTER — Telehealth: Payer: Self-pay | Admitting: Internal Medicine

## 2019-01-30 DIAGNOSIS — G453 Amaurosis fugax: Secondary | ICD-10-CM | POA: Insufficient documentation

## 2019-01-30 NOTE — Telephone Encounter (Signed)
Copied from [place] [id number]. Topic: General - Other >> Jan 29, 2019  2:47 PM Keene Breath wrote: Reason for CRM: Patient called to request to have her referral for her heart sooner than Monday.  Patient stated that they called her and the earliest they could do it would be Monday.  Patient would like a referral to a facility that could do it quicker.  Please advise and call patient to confirm.  CB# [phone]    I called [facility] to see if they had an availability before 02-02-2019. I was told there was no appt available it may be a cancelation, and the pt can call and check for cancellations. I called the pt to inform her. There was no availability and told her that she could check with [facility] tomorrow, 01-31-2019, for any cancellation pt understood. The phone number to [facility] was given to the Patient.

## 2019-02-03 ENCOUNTER — Other Ambulatory Visit: Payer: Self-pay

## 2019-02-03 ENCOUNTER — Ambulatory Visit (HOSPITAL_COMMUNITY): Payer: Medicare Other | Attending: Internal Medicine

## 2019-02-03 DIAGNOSIS — G453 Amaurosis fugax: Secondary | ICD-10-CM | POA: Insufficient documentation

## 2019-02-04 DIAGNOSIS — H4720 Unspecified optic atrophy: Secondary | ICD-10-CM | POA: Diagnosis not present

## 2019-02-04 DIAGNOSIS — H43392 Other vitreous opacities, left eye: Secondary | ICD-10-CM | POA: Diagnosis not present

## 2019-02-04 DIAGNOSIS — H34211 Partial retinal artery occlusion, right eye: Secondary | ICD-10-CM | POA: Diagnosis not present

## 2019-02-04 DIAGNOSIS — H2512 Age-related nuclear cataract, left eye: Secondary | ICD-10-CM | POA: Diagnosis not present

## 2019-02-21 ENCOUNTER — Telehealth: Payer: Self-pay | Admitting: Internal Medicine

## 2019-02-21 ENCOUNTER — Other Ambulatory Visit: Payer: Self-pay | Admitting: Internal Medicine

## 2019-02-21 DIAGNOSIS — F411 Generalized anxiety disorder: Secondary | ICD-10-CM

## 2019-02-21 NOTE — Telephone Encounter (Signed)
Medication: ALPRAZolam (XANAX) 0.25 MG tablet [[id number]]   [facility]  [id number] [place] [phone] (Phone) [phone] (Fax)   Pt states that she only has enough medication for two days.   Pt would like a call back today if possible

## 2019-02-21 NOTE — Telephone Encounter (Signed)
Already sent.

## 2019-03-11 ENCOUNTER — Other Ambulatory Visit: Payer: Self-pay | Admitting: Internal Medicine

## 2019-04-09 DIAGNOSIS — H43822 Vitreomacular adhesion, left eye: Secondary | ICD-10-CM | POA: Diagnosis not present

## 2019-04-09 DIAGNOSIS — Z961 Presence of intraocular lens: Secondary | ICD-10-CM | POA: Diagnosis not present

## 2019-04-09 DIAGNOSIS — H3411 Central retinal artery occlusion, right eye: Secondary | ICD-10-CM | POA: Diagnosis not present

## 2019-04-09 DIAGNOSIS — H02831 Dermatochalasis of right upper eyelid: Secondary | ICD-10-CM | POA: Diagnosis not present

## 2019-04-09 DIAGNOSIS — H02834 Dermatochalasis of left upper eyelid: Secondary | ICD-10-CM | POA: Diagnosis not present

## 2019-04-09 DIAGNOSIS — Z9889 Other specified postprocedural states: Secondary | ICD-10-CM | POA: Diagnosis not present

## 2019-04-09 DIAGNOSIS — H00025 Hordeolum internum left lower eyelid: Secondary | ICD-10-CM | POA: Diagnosis not present

## 2019-04-09 DIAGNOSIS — H2512 Age-related nuclear cataract, left eye: Secondary | ICD-10-CM | POA: Diagnosis not present

## 2019-04-09 DIAGNOSIS — H57819 Brow ptosis, unspecified: Secondary | ICD-10-CM | POA: Diagnosis not present

## 2019-04-23 DIAGNOSIS — H00025 Hordeolum internum left lower eyelid: Secondary | ICD-10-CM | POA: Diagnosis not present

## 2019-05-02 DIAGNOSIS — H0102A Squamous blepharitis right eye, upper and lower eyelids: Secondary | ICD-10-CM | POA: Diagnosis not present

## 2019-05-02 DIAGNOSIS — H0102B Squamous blepharitis left eye, upper and lower eyelids: Secondary | ICD-10-CM | POA: Diagnosis not present

## 2019-05-02 DIAGNOSIS — H2512 Age-related nuclear cataract, left eye: Secondary | ICD-10-CM | POA: Diagnosis not present

## 2019-05-02 DIAGNOSIS — H57819 Brow ptosis, unspecified: Secondary | ICD-10-CM | POA: Diagnosis not present

## 2019-05-02 DIAGNOSIS — H00024 Hordeolum internum left upper eyelid: Secondary | ICD-10-CM | POA: Diagnosis not present

## 2019-05-02 DIAGNOSIS — H0015 Chalazion left lower eyelid: Secondary | ICD-10-CM | POA: Diagnosis not present

## 2019-05-02 DIAGNOSIS — H02834 Dermatochalasis of left upper eyelid: Secondary | ICD-10-CM | POA: Diagnosis not present

## 2019-05-02 DIAGNOSIS — H00011 Hordeolum externum right upper eyelid: Secondary | ICD-10-CM | POA: Diagnosis not present

## 2019-05-02 DIAGNOSIS — H02831 Dermatochalasis of right upper eyelid: Secondary | ICD-10-CM | POA: Diagnosis not present

## 2019-05-07 ENCOUNTER — Other Ambulatory Visit: Payer: Self-pay | Admitting: Internal Medicine

## 2019-05-07 DIAGNOSIS — H0102A Squamous blepharitis right eye, upper and lower eyelids: Secondary | ICD-10-CM | POA: Diagnosis not present

## 2019-05-07 DIAGNOSIS — H00024 Hordeolum internum left upper eyelid: Secondary | ICD-10-CM | POA: Diagnosis not present

## 2019-05-07 DIAGNOSIS — H0102B Squamous blepharitis left eye, upper and lower eyelids: Secondary | ICD-10-CM | POA: Diagnosis not present

## 2019-05-07 DIAGNOSIS — F411 Generalized anxiety disorder: Secondary | ICD-10-CM

## 2019-05-07 NOTE — Telephone Encounter (Signed)
Last office visit 11/12/2018  Anxiety state  -PDMP reviewed, no red flags, I am sole prescriber. -Will refill for 3 months #120 tabs (long-term Rx).

## 2019-05-30 ENCOUNTER — Other Ambulatory Visit: Payer: Self-pay | Admitting: Internal Medicine

## 2019-05-30 DIAGNOSIS — F411 Generalized anxiety disorder: Secondary | ICD-10-CM

## 2019-07-09 ENCOUNTER — Telehealth: Payer: Self-pay | Admitting: *Deleted

## 2019-07-09 NOTE — Telephone Encounter (Signed)
Copied from [place] [id number]. Topic: General - Other >> Jul 09, 2019 11:55 AM Antonieta Iba C wrote: Reason for CRM: pt called in to update provider on change that insurance is making to pt's medication. Pt says that  she has to change to the Estradial the oral form instead of estrogens, conjugated, (PREMARIN) 0.625 MG tablet,  Pt says that this change will take affect in January. If provider would like for pt to continue current Rx provider would have to complete a PA.

## 2019-08-21 DIAGNOSIS — H4720 Unspecified optic atrophy: Secondary | ICD-10-CM | POA: Diagnosis not present

## 2019-08-21 DIAGNOSIS — H2512 Age-related nuclear cataract, left eye: Secondary | ICD-10-CM | POA: Diagnosis not present

## 2019-08-21 DIAGNOSIS — H34211 Partial retinal artery occlusion, right eye: Secondary | ICD-10-CM | POA: Diagnosis not present

## 2019-08-21 DIAGNOSIS — H43392 Other vitreous opacities, left eye: Secondary | ICD-10-CM | POA: Diagnosis not present

## 2019-08-24 ENCOUNTER — Other Ambulatory Visit: Payer: Self-pay | Admitting: Internal Medicine

## 2019-08-27 DIAGNOSIS — H2512 Age-related nuclear cataract, left eye: Secondary | ICD-10-CM | POA: Diagnosis not present

## 2019-08-27 DIAGNOSIS — H3589 Other specified retinal disorders: Secondary | ICD-10-CM | POA: Diagnosis not present

## 2019-08-27 DIAGNOSIS — Z9841 Cataract extraction status, right eye: Secondary | ICD-10-CM | POA: Diagnosis not present

## 2019-08-27 DIAGNOSIS — I358 Other nonrheumatic aortic valve disorders: Secondary | ICD-10-CM | POA: Diagnosis not present

## 2019-08-27 DIAGNOSIS — H4720 Unspecified optic atrophy: Secondary | ICD-10-CM | POA: Diagnosis not present

## 2019-08-27 DIAGNOSIS — Z961 Presence of intraocular lens: Secondary | ICD-10-CM | POA: Diagnosis not present

## 2019-08-27 DIAGNOSIS — Z7982 Long term (current) use of aspirin: Secondary | ICD-10-CM | POA: Diagnosis not present

## 2019-08-27 DIAGNOSIS — H47291 Other optic atrophy, right eye: Secondary | ICD-10-CM | POA: Diagnosis not present

## 2019-09-10 ENCOUNTER — Other Ambulatory Visit: Payer: Self-pay | Admitting: Internal Medicine

## 2019-09-10 DIAGNOSIS — F411 Generalized anxiety disorder: Secondary | ICD-10-CM

## 2019-09-10 NOTE — Telephone Encounter (Signed)
Please approve

## 2019-09-30 ENCOUNTER — Telehealth: Payer: Self-pay | Admitting: Internal Medicine

## 2019-09-30 DIAGNOSIS — E8941 Symptomatic postprocedural ovarian failure: Secondary | ICD-10-CM

## 2019-10-01 MED ORDER — ESTRADIOL 1 MG PO TABS: 1.0000 mg | ORAL_TABLET | Freq: Every day | ORAL | 1 refills | Status: DC

## 2019-10-01 NOTE — Addendum Note (Signed)
Addended by: Dutch Quint B on: 10/01/2019 08:10 PM   Modules accepted: Orders

## 2019-10-01 NOTE — Telephone Encounter (Signed)
PA requested for estrogens, conjugated, (PREMARIN) 0.625 MG tablet  Or  Estradiol 1 mg does not require a PA.   Okay to change?

## 2019-10-02 NOTE — Telephone Encounter (Signed)
Patient is aware and medication list has been updated.

## 2019-10-02 NOTE — Addendum Note (Signed)
Addended by: Westley Hummer B on: 10/02/2019 10:06 AM   Modules accepted: Orders

## 2019-10-07 DIAGNOSIS — H34231 Retinal artery branch occlusion, right eye: Secondary | ICD-10-CM | POA: Diagnosis not present

## 2019-10-07 DIAGNOSIS — H0102A Squamous blepharitis right eye, upper and lower eyelids: Secondary | ICD-10-CM | POA: Diagnosis not present

## 2019-10-07 DIAGNOSIS — H02834 Dermatochalasis of left upper eyelid: Secondary | ICD-10-CM | POA: Diagnosis not present

## 2019-10-07 DIAGNOSIS — H0102B Squamous blepharitis left eye, upper and lower eyelids: Secondary | ICD-10-CM | POA: Diagnosis not present

## 2019-10-07 DIAGNOSIS — H2512 Age-related nuclear cataract, left eye: Secondary | ICD-10-CM | POA: Diagnosis not present

## 2019-10-07 DIAGNOSIS — H02831 Dermatochalasis of right upper eyelid: Secondary | ICD-10-CM | POA: Diagnosis not present

## 2019-10-07 DIAGNOSIS — Z9889 Other specified postprocedural states: Secondary | ICD-10-CM | POA: Diagnosis not present

## 2019-10-07 DIAGNOSIS — Z961 Presence of intraocular lens: Secondary | ICD-10-CM | POA: Diagnosis not present

## 2019-10-14 ENCOUNTER — Encounter: Payer: Self-pay | Admitting: Certified Nurse Midwife

## 2019-10-16 ENCOUNTER — Other Ambulatory Visit: Payer: Self-pay | Admitting: Adult Health

## 2019-10-16 DIAGNOSIS — F411 Generalized anxiety disorder: Secondary | ICD-10-CM

## 2019-10-16 NOTE — Telephone Encounter (Signed)
Pt called and stated she is out and needs the Alprazolam refilled.  Pharmacy: [facility]: [phone]

## 2019-10-24 ENCOUNTER — Other Ambulatory Visit: Payer: Self-pay | Admitting: Family

## 2019-10-31 ENCOUNTER — Other Ambulatory Visit: Payer: Self-pay | Admitting: Internal Medicine

## 2019-10-31 DIAGNOSIS — F411 Generalized anxiety disorder: Secondary | ICD-10-CM

## 2019-11-17 ENCOUNTER — Other Ambulatory Visit: Payer: Self-pay | Admitting: Internal Medicine

## 2019-11-17 DIAGNOSIS — F411 Generalized anxiety disorder: Secondary | ICD-10-CM

## 2019-11-20 ENCOUNTER — Other Ambulatory Visit: Payer: Self-pay | Admitting: Adult Health

## 2019-11-24 ENCOUNTER — Other Ambulatory Visit: Payer: Self-pay | Admitting: Internal Medicine

## 2019-11-24 DIAGNOSIS — E8941 Symptomatic postprocedural ovarian failure: Secondary | ICD-10-CM

## 2019-12-16 ENCOUNTER — Telehealth: Payer: Self-pay | Admitting: Internal Medicine

## 2019-12-16 NOTE — Telephone Encounter (Signed)
If patient would like to be seen sooner, okay to schedule with a different provider.

## 2019-12-16 NOTE — Telephone Encounter (Signed)
The patient is scheduled for an in office appointment on Friday at 10 AM. She was wondering if Dr. Jerilee Hoh is wanting to see her sooner than Friday. I didn't see any available appointment earlier this week for in office. Does Jerilee Hoh want the patient to do a virtual visit sooner or to come in the office before Friday.  Please advise

## 2019-12-17 NOTE — Telephone Encounter (Signed)
Patient called back and wants to only see Elizabeth Ward so she is keeping her appointment for Friday.

## 2019-12-17 NOTE — Telephone Encounter (Signed)
LMVM for the patient to contact the office to see if the patient wants to be seen sooner by another provider.

## 2019-12-18 ENCOUNTER — Other Ambulatory Visit: Payer: Self-pay

## 2019-12-19 ENCOUNTER — Encounter: Payer: Self-pay | Admitting: Internal Medicine

## 2019-12-19 ENCOUNTER — Other Ambulatory Visit: Payer: Self-pay | Admitting: Internal Medicine

## 2019-12-19 ENCOUNTER — Ambulatory Visit (INDEPENDENT_AMBULATORY_CARE_PROVIDER_SITE_OTHER): Payer: Medicare Other | Admitting: Internal Medicine

## 2019-12-19 VITALS — BP 120/70 | HR 72 | Temp 97.8°F | Ht 62.5 in | Wt 122.3 lb

## 2019-12-19 DIAGNOSIS — G6289 Other specified polyneuropathies: Secondary | ICD-10-CM

## 2019-12-19 DIAGNOSIS — H9202 Otalgia, left ear: Secondary | ICD-10-CM | POA: Diagnosis not present

## 2019-12-19 DIAGNOSIS — F411 Generalized anxiety disorder: Secondary | ICD-10-CM | POA: Diagnosis not present

## 2019-12-19 DIAGNOSIS — E559 Vitamin D deficiency, unspecified: Secondary | ICD-10-CM | POA: Insufficient documentation

## 2019-12-19 DIAGNOSIS — B370 Candidal stomatitis: Secondary | ICD-10-CM

## 2019-12-19 LAB — VITAMIN B12: Vitamin B-12: 300 pg/mL (ref 211–911)

## 2019-12-19 LAB — TSH: TSH: 1.87 u[IU]/mL (ref 0.35–4.50)

## 2019-12-19 LAB — VITAMIN D 25 HYDROXY (VIT D DEFICIENCY, FRACTURES): VITD: 13.49 ng/mL — ABNORMAL LOW (ref 30.00–100.00)

## 2019-12-19 MED ORDER — VITAMIN D (ERGOCALCIFEROL) 1.25 MG (50000 UNIT) PO CAPS: 50000.0000 [IU] | ORAL_CAPSULE | ORAL | 0 refills | Status: AC

## 2019-12-19 MED ORDER — NYSTATIN 100000 UNIT/ML MT SUSP: 5.0000 mL | Freq: Three times a day (TID) | OROMUCOSAL | 0 refills | Status: AC

## 2019-12-19 MED ORDER — ALPRAZOLAM 0.25 MG PO TABS: 0.2500 mg | ORAL_TABLET | Freq: Four times a day (QID) | ORAL | 1 refills | Status: DC

## 2019-12-19 MED ORDER — GABAPENTIN 100 MG PO CAPS: 100.0000 mg | ORAL_CAPSULE | Freq: Every day | ORAL | 1 refills | Status: DC

## 2019-12-19 NOTE — Progress Notes (Signed)
Established Patient Office Visit     This visit occurred during the SARS-CoV-2 public health emergency.  Safety protocols were in place, including screening questions prior to the visit, additional usage of staff PPE, and extensive cleaning of exam room while observing appropriate contact time as indicated for disinfecting solutions.    CC/Reason for Visit: Discuss some acute and subacute concerns  HPI: Elizabeth Ward is a 74 y.o. female who is coming in today for the above mentioned reasons.  She is here today to discuss some acute/subacute concerns:  1.  She continues to have paresthesias of her knee down in a stocking-like distribution.  She describes it as numbing and tingling of her hands and feet.  At one point she was seeing a neurologist but did not care to continue under their management.  EMG studies were never completed.  She tells me she tried gabapentin and did not tolerate it well although she does not remember what response she had.  2.  She has been having left ear pain that would like me to assess.  3.  She describes a white coating on her tongue, some difficulty swallowing.  She wonders if she may have thrush.  4.  She is due for Xanax refills per contract.   Past Medical/Surgical History: Past Medical History:  Diagnosis Date  . ANXIETY 08/14/2008  . INSOMNIA 09/07/2009  . Irritable bowel syndrome 09/07/2009  . MENOPAUSE, SURGICAL 08/14/2008  . Shingles   . UNSPECIFIED VAGINITIS AND VULVOVAGINITIS 06/04/2010    Past Surgical History:  Procedure Laterality Date  . ABDOMINAL HYSTERECTOMY    . CATARACT EXTRACTION Right   . COLECTOMY     partial  . EYE SURGERY    . TONSILLECTOMY    . TONSILLECTOMY      Social History:  reports that she has never smoked. She has never used smokeless tobacco. She reports current alcohol use of about 7.0 standard drinks of alcohol per week. She reports that she does not use drugs.  Allergies: Allergies  Allergen  Reactions  . Prochlorperazine Edisylate Other (See Comments)    Tongue spasms(tongue would droop back in to the throat   . Tape Rash    Family History:  Family History  Problem Relation Age of Onset  . Hodgkin's lymphoma Mother   . Lung cancer Father   . Heart attack Son 76     Current Outpatient Medications:  .  ALPRAZolam (XANAX) 0.25 MG tablet, Take 1 tablet (0.25 mg total) by mouth 4 (four) times daily., Disp: 120 tablet, Rfl: 1 .  aspirin EC 81 MG tablet, Take 1 tablet (81 mg total) by mouth daily., Disp: 1 tablet, Rfl: 0 .  estradiol (ESTRACE) 0.1 MG/GM vaginal cream, PLACE 1 APPLICATORFUL VAGINALLY 2 (TWO) TIMES A WEEK., Disp: 127.5 g, Rfl: 0 .  estradiol (ESTRACE) 1 MG tablet, TAKE 1 TABLET BY MOUTH EVERY DAY, Disp: 90 tablet, Rfl: 1 .  FLUoxetine (PROZAC) 10 MG tablet, TAKE 1 TABLET EVERY DAY, Disp: 90 tablet, Rfl: 0 .  fluticasone (FLONASE) 50 MCG/ACT nasal spray, Place 2 sprays into both nostrils daily., Disp: 16 g, Rfl: 6 .  ibuprofen (ADVIL,MOTRIN) 600 MG tablet, TAKE ONE TABLET BY MOUTH EVERY 6-8 HOURS AS NEEDED FOR PAIN, Disp: , Rfl: 0 .  nortriptyline (PAMELOR) 10 MG capsule, Take 1 capsule (10 mg total) by mouth at bedtime., Disp: 90 capsule, Rfl: 1 .  traZODone (DESYREL) 50 MG tablet, TAKE 0.5 TABLETS (25 MG TOTAL) BY  MOUTH AT BEDTIME AS NEEDED., Disp: 45 tablet, Rfl: 0 .  gabapentin (NEURONTIN) 100 MG capsule, Take 1 capsule (100 mg total) by mouth at bedtime., Disp: 90 capsule, Rfl: 1 .  nystatin (MYCOSTATIN) 100000 UNIT/ML suspension, Take 5 mLs (500,000 Units total) by mouth in the morning, at noon, and at bedtime for 10 days., Disp: 150 mL, Rfl: 0  Review of Systems:  Constitutional: Denies fever, chills, diaphoresis, appetite change. HEENT: Denies photophobia, eye pain, redness, hearing loss,  sore throat, rhinorrhea, sneezing, mouth sores, trouble swallowing, neck pain, neck stiffness and tinnitus.   Respiratory: Denies SOB, DOE, cough, chest tightness,  and  wheezing.   Cardiovascular: Denies chest pain, palpitations and leg swelling.  Gastrointestinal: Denies nausea, vomiting, abdominal pain, diarrhea, constipation, blood in stool and abdominal distention.  Genitourinary: Denies dysuria, urgency, frequency, hematuria, flank pain and difficulty urinating.  Endocrine: Denies: hot or cold intolerance, sweats, changes in hair or nails, polyuria, polydipsia. Musculoskeletal: Denies myalgias, back pain, joint swelling, arthralgias and gait problem.  Skin: Denies pallor, rash and wound.  Neurological: Denies dizziness, seizures, syncope, weakness, light-headedness. Hematological: Denies adenopathy. Easy bruising, personal or family bleeding history  Psychiatric/Behavioral: Denies suicidal ideation, mood changes, confusion, nervousness, sleep disturbance and agitation    Physical Exam: Vitals:   12/19/19 1041  BP: 120/70  Pulse: 72  Temp: 97.8 F (36.6 C)  TempSrc: Temporal  SpO2: 99%  Weight: 122 lb 4.8 oz (55.5 kg)  Height: 5' 2.5" (1.588 m)    Body mass index is 22.01 kg/m.   Constitutional: NAD, calm, comfortable Eyes: PERRL, lids and conjunctivae normal, wears corrective lenses ENMT: Mucous membranes are moist.  Tympanic membrane is pearly white, no erythema or bulging. Respiratory: clear to auscultation bilaterally, no wheezing, no crackles. Normal respiratory effort. No accessory muscle use.  Cardiovascular: Regular rate and rhythm, no murmurs / rubs / gallops. No extremity edema.  Psychiatric: Normal judgment and insight. Alert and oriented x 3. Normal mood.    Impression and Plan:  Other polyneuropathy  -She has been dealing with this neuropathy now for some time. -I will again check vitamin B12, TSH, vitamin D to see if any of this might be influencing. -She is amenable to retrying gabapentin, will start low-dose 100 mg at nighttime. -She has also agreeable for referral to a different neurologist for further work-up.  She  has never had EMG studies.  Anxiety state -PDMP reviewed, no red flags, overdose risk score is 200. -Refill alprazolam x3 months.  Thrush  -She does appear to have thrush on exam today. -I will prescribe nystatin suspension for 10 days.  Left ear pain -No abnormality noted on ear examination today, tympanic membrane is white with no erythema or bulging.    Patient Instructions  -Nice seeing you today!!  -Lab work today; will notify you once results are available.  -Start gabapentin 100 mg at bedtime.  -Start nystatin suspension 5 cc 3 times a day for 10 days.  -Schedule follow up in 3 months.     Lelon Frohlich, MD [facility]

## 2019-12-19 NOTE — Patient Instructions (Signed)
-  Nice seeing you today!!  -Lab work today; will notify you once results are available.  -Start gabapentin 100 mg at bedtime.  -Start nystatin suspension 5 cc 3 times a day for 10 days.  -Schedule follow up in 3 months.

## 2019-12-23 NOTE — Addendum Note (Signed)
Addended by: Agnes Lawrence on: 12/23/2019 08:28 AM   Modules accepted: Orders

## 2020-01-22 ENCOUNTER — Other Ambulatory Visit: Payer: Self-pay | Admitting: Internal Medicine

## 2020-01-22 DIAGNOSIS — F411 Generalized anxiety disorder: Secondary | ICD-10-CM

## 2020-02-12 ENCOUNTER — Other Ambulatory Visit: Payer: Self-pay | Admitting: Internal Medicine

## 2020-02-12 MED ORDER — TRAZODONE HCL 50 MG PO TABS: 25.0000 mg | ORAL_TABLET | Freq: Every evening | ORAL | 0 refills | Status: DC | PRN

## 2020-02-12 NOTE — Telephone Encounter (Signed)
Pt is calling in stating that she needs a refill on trazodone 50 MG   Pharm:  [facility] on [address]

## 2020-02-12 NOTE — Telephone Encounter (Signed)
Please deny.  Already sent.

## 2020-02-19 ENCOUNTER — Ambulatory Visit (INDEPENDENT_AMBULATORY_CARE_PROVIDER_SITE_OTHER): Payer: Medicare Other | Admitting: Ophthalmology

## 2020-02-19 ENCOUNTER — Encounter (INDEPENDENT_AMBULATORY_CARE_PROVIDER_SITE_OTHER): Payer: Self-pay | Admitting: Ophthalmology

## 2020-02-19 ENCOUNTER — Other Ambulatory Visit: Payer: Self-pay

## 2020-02-19 DIAGNOSIS — H43392 Other vitreous opacities, left eye: Secondary | ICD-10-CM

## 2020-02-19 DIAGNOSIS — H3550 Unspecified hereditary retinal dystrophy: Secondary | ICD-10-CM | POA: Diagnosis not present

## 2020-02-19 DIAGNOSIS — Z961 Presence of intraocular lens: Secondary | ICD-10-CM | POA: Diagnosis not present

## 2020-02-19 DIAGNOSIS — H4720 Unspecified optic atrophy: Secondary | ICD-10-CM

## 2020-02-19 DIAGNOSIS — H43393 Other vitreous opacities, bilateral: Secondary | ICD-10-CM | POA: Diagnosis not present

## 2020-02-19 DIAGNOSIS — H34211 Partial retinal artery occlusion, right eye: Secondary | ICD-10-CM

## 2020-02-19 NOTE — Progress Notes (Signed)
02/19/2020     CHIEF COMPLAINT Patient presents for Retina Follow Up   HISTORY OF PRESENT ILLNESS: Elizabeth Ward is a 74 y.o. female who presents to the clinic today for:   HPI    Retina Follow Up    Patient presents with  Other.  In both eyes.  This started 6 months ago.  Severity is moderate.  Duration of 6 months.  Since onset it is stable.          Comments    6 Month F/U OU  Pt reports black dot floater intermittently ("hardly ever") OD. [facility].       Last edited by Rockie Neighbours, [place] on 02/19/2020  1:25 PM. (History)      Referring physician: Isaac Bliss, Rayford Halsted, MD [place],  [place] [address]  HISTORICAL INFORMATION:   Selected notes from the [place]: No current outpatient medications on file. (Ophthalmic Drugs)   No current facility-administered medications for this visit. (Ophthalmic Drugs)   Current Outpatient Medications (Other)  Medication Sig  . ALPRAZolam (XANAX) 0.25 MG tablet Take 1 tablet (0.25 mg total) by mouth 4 (four) times daily.  Marland Kitchen aspirin EC 81 MG tablet Take 1 tablet (81 mg total) by mouth daily.  Marland Kitchen estradiol (ESTRACE) 0.1 MG/GM vaginal cream PLACE 1 APPLICATORFUL VAGINALLY 2 (TWO) TIMES A WEEK.  Marland Kitchen estradiol (ESTRACE) 1 MG tablet TAKE 1 TABLET BY MOUTH EVERY DAY  . FLUoxetine (PROZAC) 10 MG tablet TAKE 1 TABLET BY MOUTH EVERY DAY  . fluticasone (FLONASE) 50 MCG/ACT nasal spray Place 2 sprays into both nostrils daily.  Marland Kitchen gabapentin (NEURONTIN) 100 MG capsule Take 1 capsule (100 mg total) by mouth at bedtime.  Marland Kitchen ibuprofen (ADVIL,MOTRIN) 600 MG tablet TAKE ONE TABLET BY MOUTH EVERY 6-8 HOURS AS NEEDED FOR PAIN  . nortriptyline (PAMELOR) 10 MG capsule Take 1 capsule (10 mg total) by mouth at bedtime.  . traZODone (DESYREL) 50 MG tablet Take 0.5 tablets (25 mg total) by mouth at bedtime as needed.  . Vitamin D, Ergocalciferol, (DRISDOL) 1.25 MG (50000 UNIT) CAPS  capsule Take 1 capsule (50,000 Units total) by mouth every 7 (seven) days for 12 doses.   No current facility-administered medications for this visit. (Other)      REVIEW OF SYSTEMS:    ALLERGIES Allergies  Allergen Reactions  . Prochlorperazine Edisylate Other (See Comments)    Tongue spasms(tongue would droop back in to the throat   . Tape Rash    PAST MEDICAL HISTORY Past Medical History:  Diagnosis Date  . ANXIETY 08/14/2008  . INSOMNIA 09/07/2009  . Irritable bowel syndrome 09/07/2009  . MENOPAUSE, SURGICAL 08/14/2008  . Shingles   . UNSPECIFIED VAGINITIS AND VULVOVAGINITIS 06/04/2010   Past Surgical History:  Procedure Laterality Date  . ABDOMINAL HYSTERECTOMY    . CATARACT EXTRACTION Right   . COLECTOMY     partial  . EYE SURGERY    . TONSILLECTOMY    . TONSILLECTOMY      FAMILY HISTORY Family History  Problem Relation Age of Onset  . Hodgkin's lymphoma Mother   . Lung cancer Father   . Heart attack Son 65    SOCIAL HISTORY Social History   Tobacco Use  . Smoking status: Never Smoker  . Smokeless tobacco: Never Used  Vaping Use  . Vaping Use: Never used  Substance Use Topics  . Alcohol use: Yes  Alcohol/week: 7.0 standard drinks    Types: 7 Glasses of wine per week  . Drug use: No         OPHTHALMIC EXAM:  Base Eye Exam    Visual Acuity (ETDRS)      Right Left   Dist cc 20/20 -2 20/20 -2   Correction: Glasses       Tonometry (Tonopen, 1:30 PM)      Right Left   Pressure 14 17       Pupils      Pupils Dark Light Shape React APD   Right PERRL 4 3 Round Brisk None   Left PERRL 4 3 Round Brisk None       Visual Fields (Counting fingers)      Left Right    Full Full       Extraocular Movement      Right Left    Full Full       Neuro/Psych    Oriented x3: Yes   Mood/Affect: Normal       Dilation    Both eyes: 1.0% Mydriacyl, 2.5% Phenylephrine @ 1:30 PM        Slit Lamp and Fundus Exam    External Exam       Right Left   External Normal Normal       Slit Lamp Exam      Right Left   Lids/Lashes Normal Normal   Conjunctiva/Sclera White and quiet White and quiet   Cornea Clear Clear   Anterior Chamber Deep and quiet Deep and quiet   Iris Round and reactive Round and reactive   Lens Centered posterior chamber intraocular lens Centered posterior chamber intraocular lens   Anterior Vitreous Normal Normal       Fundus Exam      Right Left   Posterior Vitreous Posterior vitreous detachment Posterior vitreous detachment   Disc Normal    C/D Ratio 0.4 0.35   Macula Normal    Vessels Normal    Periphery Normal           IMAGING AND PROCEDURES  Imaging and Procedures for 02/19/20  Color Fundus Photography Optos - OU - Both Eyes       Right Eye Progression has no prior data. Disc findings include pallor, increased cup to disc ratio. Macula : normal observations. Vessels : normal observations.   Left Eye Progression has no prior data. Disc findings include increased cup to disc ratio. Macula : normal observations. Vessels : normal observations. Periphery : normal observations.   Notes Bilateral PVD, no changes, no holes or tears                ASSESSMENT/PLAN:  No problem-specific Assessment & Plan notes found for this encounter.      ICD-10-CM   1. Cholesterol retinal embolus of right eye  H34.211 Color Fundus Photography Optos - OU - Both Eyes  2. Optic atrophy associated with retinal dystrophies  H47.20 Color Fundus Photography Optos - OU - Both Eyes   H35.50   3. Vitreous floaters, left  H43.392   4. Pseudophakia  Z96.1   5. Vitreous floaters of both eyes  H43.393     1.  2.  3.  Ophthalmic Meds Ordered this visit:  No orders of the defined types were placed in this encounter.      Return in about 1 year (around 02/18/2021) for DILATE OU, OCT, COLOR FP.  There are no Patient Instructions on file for this  visit.   Explained the diagnoses, plan, and  follow up with the patient and they expressed understanding.  Patient expressed understanding of the importance of proper follow up care.   Clent Demark Lonza Shimabukuro M.D. Diseases & Surgery of the Retina and Vitreous Retina & Diabetic [place] 02/19/20     Abbreviations: M myopia (nearsighted); A astigmatism; H hyperopia (farsighted); P presbyopia; Mrx spectacle prescription;  CTL contact lenses; OD right eye; OS left eye; OU both eyes  XT exotropia; ET esotropia; PEK punctate epithelial keratitis; PEE punctate epithelial erosions; DES dry eye syndrome; MGD meibomian gland dysfunction; ATs artificial tears; PFAT's preservative free artificial tears; [place] nuclear sclerotic cataract; PSC posterior subcapsular cataract; ERM epi-retinal membrane; PVD posterior vitreous detachment; RD retinal detachment; DM diabetes mellitus; DR diabetic retinopathy; NPDR non-proliferative diabetic retinopathy; PDR proliferative diabetic retinopathy; CSME clinically significant macular edema; DME diabetic macular edema; dbh dot blot hemorrhages; CWS cotton wool spot; POAG primary open angle glaucoma; C/D cup-to-disc ratio; HVF humphrey visual field; GVF goldmann visual field; OCT optical coherence tomography; IOP intraocular pressure; BRVO Branch retinal vein occlusion; CRVO central retinal vein occlusion; CRAO central retinal artery occlusion; BRAO branch retinal artery occlusion; RT retinal tear; SB scleral buckle; PPV pars plana vitrectomy; VH Vitreous hemorrhage; PRP panretinal laser photocoagulation; IVK intravitreal kenalog; VMT vitreomacular traction; MH Macular hole;  NVD neovascularization of the disc; NVE neovascularization elsewhere; AREDS age related eye disease study; ARMD age related macular degeneration; POAG primary open angle glaucoma; EBMD epithelial/anterior basement membrane dystrophy; ACIOL anterior chamber intraocular lens; IOL intraocular lens; PCIOL posterior chamber intraocular lens; Phaco/IOL  phacoemulsification with intraocular lens placement; PRK photorefractive keratectomy; LASIK laser assisted in situ keratomileusis; HTN hypertension; DM diabetes mellitus; COPD chronic obstructive pulmonary disease   02/19/2020     CHIEF COMPLAINT Patient presents for Retina Follow Up   HISTORY OF PRESENT ILLNESS: Elizabeth Ward is a 74 y.o. female who presents to the clinic today for:   HPI    Retina Follow Up    Patient presents with  Other.  In both eyes.  This started 6 months ago.  Severity is moderate.  Duration of 6 months.  Since onset it is stable.          Comments    6 Month F/U OU  Pt reports black dot floater intermittently ("hardly ever") OD. [facility].       Last edited by Rockie Neighbours, [place] on 02/19/2020  1:25 PM. (History)      Referring physician: Isaac Bliss, Rayford Halsted, MD [place],  [place] [address]  HISTORICAL INFORMATION:   Selected notes from the [facility]: No current outpatient medications on file. (Ophthalmic Drugs)   No current facility-administered medications for this visit. (Ophthalmic Drugs)   Current Outpatient Medications (Other)  Medication Sig  . ALPRAZolam (XANAX) 0.25 MG tablet Take 1 tablet (0.25 mg total) by mouth 4 (four) times daily.  Marland Kitchen aspirin EC 81 MG tablet Take 1 tablet (81 mg total) by mouth daily.  Marland Kitchen estradiol (ESTRACE) 0.1 MG/GM vaginal cream PLACE 1 APPLICATORFUL VAGINALLY 2 (TWO) TIMES A WEEK.  Marland Kitchen estradiol (ESTRACE) 1 MG tablet TAKE 1 TABLET BY MOUTH EVERY DAY  . FLUoxetine (PROZAC) 10 MG tablet TAKE 1 TABLET BY MOUTH EVERY DAY  . fluticasone (FLONASE) 50 MCG/ACT nasal spray Place 2 sprays into both nostrils daily.  Marland Kitchen gabapentin (NEURONTIN) 100 MG capsule Take 1 capsule (100 mg  total) by mouth at bedtime.  Marland Kitchen ibuprofen (ADVIL,MOTRIN) 600 MG tablet TAKE ONE TABLET BY MOUTH EVERY 6-8 HOURS AS NEEDED FOR PAIN  . nortriptyline (PAMELOR) 10 MG capsule Take  1 capsule (10 mg total) by mouth at bedtime.  . traZODone (DESYREL) 50 MG tablet Take 0.5 tablets (25 mg total) by mouth at bedtime as needed.  . Vitamin D, Ergocalciferol, (DRISDOL) 1.25 MG (50000 UNIT) CAPS capsule Take 1 capsule (50,000 Units total) by mouth every 7 (seven) days for 12 doses.   No current facility-administered medications for this visit. (Other)      REVIEW OF SYSTEMS:    ALLERGIES Allergies  Allergen Reactions  . Prochlorperazine Edisylate Other (See Comments)    Tongue spasms(tongue would droop back in to the throat   . Tape Rash    PAST MEDICAL HISTORY Past Medical History:  Diagnosis Date  . ANXIETY 08/14/2008  . INSOMNIA 09/07/2009  . Irritable bowel syndrome 09/07/2009  . MENOPAUSE, SURGICAL 08/14/2008  . Shingles   . UNSPECIFIED VAGINITIS AND VULVOVAGINITIS 06/04/2010   Past Surgical History:  Procedure Laterality Date  . ABDOMINAL HYSTERECTOMY    . CATARACT EXTRACTION Right   . COLECTOMY     partial  . EYE SURGERY    . TONSILLECTOMY    . TONSILLECTOMY      FAMILY HISTORY Family History  Problem Relation Age of Onset  . Hodgkin's lymphoma Mother   . Lung cancer Father   . Heart attack Son 73    SOCIAL HISTORY Social History   Tobacco Use  . Smoking status: Never Smoker  . Smokeless tobacco: Never Used  Vaping Use  . Vaping Use: Never used  Substance Use Topics  . Alcohol use: Yes    Alcohol/week: 7.0 standard drinks    Types: 7 Glasses of wine per week  . Drug use: No         OPHTHALMIC EXAM:  Base Eye Exam    Visual Acuity (ETDRS)      Right Left   Dist cc 20/20 -2 20/20 -2   Correction: Glasses       Tonometry (Tonopen, 1:30 PM)      Right Left   Pressure 14 17       Pupils      Pupils Dark Light Shape React APD   Right PERRL 4 3 Round Brisk None   Left PERRL 4 3 Round Brisk None       Visual Fields (Counting fingers)      Left Right    Full Full       Extraocular Movement      Right Left    Full  Full       Neuro/Psych    Oriented x3: Yes   Mood/Affect: Normal       Dilation    Both eyes: 1.0% Mydriacyl, 2.5% Phenylephrine @ 1:30 PM        Slit Lamp and Fundus Exam    External Exam      Right Left   External Normal Normal       Slit Lamp Exam      Right Left   Lids/Lashes Normal Normal   Conjunctiva/Sclera White and quiet White and quiet   Cornea Clear Clear   Anterior Chamber Deep and quiet Deep and quiet   Iris Round and reactive Round and reactive   Lens Centered posterior chamber intraocular lens Centered posterior chamber intraocular lens   Anterior Vitreous Normal Normal  Fundus Exam      Right Left   Posterior Vitreous Posterior vitreous detachment Posterior vitreous detachment   Disc Normal    C/D Ratio 0.4 0.35   Macula Normal    Vessels Normal    Periphery Normal           IMAGING AND PROCEDURES  Imaging and Procedures for 02/19/20  Color Fundus Photography Optos - OU - Both Eyes       Right Eye Progression has no prior data. Disc findings include pallor, increased cup to disc ratio. Macula : normal observations. Vessels : normal observations.   Left Eye Progression has no prior data. Disc findings include increased cup to disc ratio. Macula : normal observations. Vessels : normal observations. Periphery : normal observations.   Notes Bilateral PVD, no changes, no holes or tears                ASSESSMENT/PLAN:  No problem-specific Assessment & Plan notes found for this encounter.      ICD-10-CM   1. Cholesterol retinal embolus of right eye  H34.211 Color Fundus Photography Optos - OU - Both Eyes  2. Optic atrophy associated with retinal dystrophies  H47.20 Color Fundus Photography Optos - OU - Both Eyes   H35.50   3. Vitreous floaters, left  H43.392   4. Pseudophakia  Z96.1   5. Vitreous floaters of both eyes  H43.393     1.  Patient has no signs of progression of the atrophy in the right eye from a small  cholesterol retinal embolus in the past.  We will continue to observe.  2..  3.  Ophthalmic Meds Ordered this visit:  No orders of the defined types were placed in this encounter.      Return in about 1 year (around 02/18/2021) for DILATE OU, OCT, COLOR FP.  There are no Patient Instructions on file for this visit.   Explained the diagnoses, plan, and follow up with the patient and they expressed understanding.  Patient expressed understanding of the importance of proper follow up care.   Clent Demark Florencia Zaccaro M.D. Diseases & Surgery of the Retina and Vitreous Retina & Diabetic [place] 02/19/20     Abbreviations: M myopia (nearsighted); A astigmatism; H hyperopia (farsighted); P presbyopia; Mrx spectacle prescription;  CTL contact lenses; OD right eye; OS left eye; OU both eyes  XT exotropia; ET esotropia; PEK punctate epithelial keratitis; PEE punctate epithelial erosions; DES dry eye syndrome; MGD meibomian gland dysfunction; ATs artificial tears; PFAT's preservative free artificial tears; [place] nuclear sclerotic cataract; PSC posterior subcapsular cataract; ERM epi-retinal membrane; PVD posterior vitreous detachment; RD retinal detachment; DM diabetes mellitus; DR diabetic retinopathy; NPDR non-proliferative diabetic retinopathy; PDR proliferative diabetic retinopathy; CSME clinically significant macular edema; DME diabetic macular edema; dbh dot blot hemorrhages; CWS cotton wool spot; POAG primary open angle glaucoma; C/D cup-to-disc ratio; HVF humphrey visual field; GVF goldmann visual field; OCT optical coherence tomography; IOP intraocular pressure; BRVO Branch retinal vein occlusion; CRVO central retinal vein occlusion; CRAO central retinal artery occlusion; BRAO branch retinal artery occlusion; RT retinal tear; SB scleral buckle; PPV pars plana vitrectomy; VH Vitreous hemorrhage; PRP panretinal laser photocoagulation; IVK intravitreal kenalog; VMT vitreomacular traction; MH Macular  hole;  NVD neovascularization of the disc; NVE neovascularization elsewhere; AREDS age related eye disease study; ARMD age related macular degeneration; POAG primary open angle glaucoma; EBMD epithelial/anterior basement membrane dystrophy; ACIOL anterior chamber intraocular lens; IOL intraocular lens; PCIOL posterior chamber intraocular lens; Phaco/IOL phacoemulsification  with intraocular lens placement; [place] photorefractive keratectomy; LASIK laser assisted in situ keratomileusis; HTN hypertension; DM diabetes mellitus; COPD chronic obstructive pulmonary disease

## 2020-02-24 ENCOUNTER — Other Ambulatory Visit: Payer: Self-pay | Admitting: Internal Medicine

## 2020-02-24 DIAGNOSIS — F411 Generalized anxiety disorder: Secondary | ICD-10-CM

## 2020-03-04 ENCOUNTER — Other Ambulatory Visit: Payer: Self-pay | Admitting: Internal Medicine

## 2020-03-04 DIAGNOSIS — F411 Generalized anxiety disorder: Secondary | ICD-10-CM

## 2020-03-11 ENCOUNTER — Other Ambulatory Visit: Payer: Self-pay | Admitting: Internal Medicine

## 2020-03-11 DIAGNOSIS — E559 Vitamin D deficiency, unspecified: Secondary | ICD-10-CM

## 2020-03-24 ENCOUNTER — Ambulatory Visit: Payer: Medicare Other | Admitting: Internal Medicine

## 2020-03-31 ENCOUNTER — Other Ambulatory Visit: Payer: Self-pay

## 2020-03-31 ENCOUNTER — Ambulatory Visit (INDEPENDENT_AMBULATORY_CARE_PROVIDER_SITE_OTHER): Payer: Medicare Other | Admitting: Internal Medicine

## 2020-03-31 VITALS — BP 110/78 | HR 69 | Temp 97.7°F | Wt 113.5 lb

## 2020-03-31 DIAGNOSIS — F5101 Primary insomnia: Secondary | ICD-10-CM

## 2020-03-31 DIAGNOSIS — G629 Polyneuropathy, unspecified: Secondary | ICD-10-CM | POA: Diagnosis not present

## 2020-03-31 DIAGNOSIS — F411 Generalized anxiety disorder: Secondary | ICD-10-CM | POA: Diagnosis not present

## 2020-03-31 DIAGNOSIS — E559 Vitamin D deficiency, unspecified: Secondary | ICD-10-CM

## 2020-03-31 DIAGNOSIS — E8941 Symptomatic postprocedural ovarian failure: Secondary | ICD-10-CM

## 2020-03-31 NOTE — Progress Notes (Signed)
Established Patient Office Visit     This visit occurred during the SARS-CoV-2 public health emergency.  Safety protocols were in place, including screening questions prior to the visit, additional usage of staff PPE, and extensive cleaning of exam room while observing appropriate contact time as indicated for disinfecting solutions.    CC/Reason for Visit: Discuss some acute concerns  HPI: Elizabeth Ward is a 74 y.o. female who is coming in today for the above mentioned reasons.  She has been under a lot of stress as her husband was recently diagnosed with Devic's disease and has been paralyzed, currently in a nursing home.  She has custody of her 72 year old grandson.  She needs to follow-up on her vitamin D levels.  She would like to increase her gabapentin dose, she thinks she may have thrush again.   Past Medical/Surgical History: Past Medical History:  Diagnosis Date  . ANXIETY 08/14/2008  . INSOMNIA 09/07/2009  . Irritable bowel syndrome 09/07/2009  . MENOPAUSE, SURGICAL 08/14/2008  . Shingles   . UNSPECIFIED VAGINITIS AND VULVOVAGINITIS 06/04/2010    Past Surgical History:  Procedure Laterality Date  . ABDOMINAL HYSTERECTOMY    . CATARACT EXTRACTION Right   . COLECTOMY     partial  . EYE SURGERY    . TONSILLECTOMY    . TONSILLECTOMY      Social History:  reports that she has never smoked. She has never used smokeless tobacco. She reports current alcohol use of about 7.0 standard drinks of alcohol per week. She reports that she does not use drugs.  Allergies: Allergies  Allergen Reactions  . Prochlorperazine Edisylate Other (See Comments)    Tongue spasms(tongue would droop back in to the throat   . Tape Rash    Family History:  Family History  Problem Relation Age of Onset  . Hodgkin's lymphoma Mother   . Lung cancer Father   . Heart attack Son 16     Current Outpatient Medications:  .  ALPRAZolam (XANAX) 0.25 MG tablet, TAKE 1 TABLET (0.25 MG TOTAL)  BY MOUTH 4 (FOUR) TIMES DAILY., Disp: 120 tablet, Rfl: 1 .  aspirin EC 81 MG tablet, Take 1 tablet (81 mg total) by mouth daily., Disp: 1 tablet, Rfl: 0 .  estradiol (ESTRACE) 0.1 MG/GM vaginal cream, PLACE 1 APPLICATORFUL VAGINALLY 2 (TWO) TIMES A WEEK., Disp: 127.5 g, Rfl: 0 .  estradiol (ESTRACE) 1 MG tablet, TAKE 1 TABLET BY MOUTH EVERY DAY, Disp: 90 tablet, Rfl: 1 .  FLUoxetine (PROZAC) 10 MG tablet, TAKE 1 TABLET BY MOUTH EVERY DAY, Disp: 90 tablet, Rfl: 0 .  fluticasone (FLONASE) 50 MCG/ACT nasal spray, Place 2 sprays into both nostrils daily., Disp: 16 g, Rfl: 6 .  ibuprofen (ADVIL,MOTRIN) 600 MG tablet, TAKE ONE TABLET BY MOUTH EVERY 6-8 HOURS AS NEEDED FOR PAIN, Disp: , Rfl: 0 .  nortriptyline (PAMELOR) 10 MG capsule, Take 1 capsule (10 mg total) by mouth at bedtime., Disp: 90 capsule, Rfl: 1 .  traZODone (DESYREL) 50 MG tablet, Take 0.5 tablets (25 mg total) by mouth at bedtime as needed., Disp: 45 tablet, Rfl: 0 .  gabapentin (NEURONTIN) 100 MG capsule, Take 1 capsule (100 mg total) by mouth at bedtime., Disp: 90 capsule, Rfl: 1  Review of Systems:  Constitutional: Denies fever, chills, diaphoresis, appetite change and fatigue.  HEENT: Denies photophobia, eye pain, redness, hearing loss, ear pain, congestion, sore throat, rhinorrhea, sneezing, mouth sores, trouble swallowing, neck pain, neck stiffness and tinnitus.  Respiratory: Denies SOB, DOE, cough, chest tightness,  and wheezing.   Cardiovascular: Denies chest pain, palpitations and leg swelling.  Gastrointestinal: Denies nausea, vomiting, abdominal pain, diarrhea, constipation, blood in stool and abdominal distention.  Genitourinary: Denies dysuria, urgency, frequency, hematuria, flank pain and difficulty urinating.  Endocrine: Denies: hot or cold intolerance, sweats, changes in hair or nails, polyuria, polydipsia. Musculoskeletal: Denies myalgias, back pain, joint swelling, arthralgias and gait problem.  Skin: Denies pallor,  rash and wound.  Neurological: Denies dizziness, seizures, syncope, weakness, light-headedness, numbness and headaches.  Hematological: Denies adenopathy. Easy bruising, personal or family bleeding history  Psychiatric/Behavioral: Denies suicidal ideation, mood changes, confusion, nervousness, sleep disturbance and agitation    Physical Exam: Vitals:   03/31/20 1108  BP: 110/78  Pulse: 69  Temp: 97.7 F (36.5 C)  TempSrc: Oral  SpO2: 99%  Weight: 113 lb 8 oz (51.5 kg)    Body mass index is 20.43 kg/m.   Constitutional: NAD, calm, comfortable Eyes: PERRL, lids and conjunctivae normal ENMT: Mucous membranes are moist. Posterior pharynx clear of any exudate or lesions.  No evidence of thrush. Respiratory: clear to auscultation bilaterally, no wheezing, no crackles. Normal respiratory effort. No accessory muscle use.  Cardiovascular: Regular rate and rhythm, no murmurs / rubs / gallops. No extremity edema.  Neurologic: Grossly intact and nonfocal Psychiatric: Normal judgment and insight. Alert and oriented x 3. Normal mood.    Impression and Plan:  Neuropathy -She will increase Neurontin to 200 mg at bedtime and will notify me of effects.  She is concerned about possibility of increased drowsiness  Vitamin D deficiency  - Plan: VITAMIN D 25 Hydroxy (Vit-D Deficiency, Fractures)  Anxiety state Primary insomnia -She takes alprazolam and trazodone.  MENOPAUSE, SURGICAL -On hormone replacement therapy.   Patient Instructions  -Nice seeing you today!!  -Lab work today; will notify you once results are available.  -Make sure you take a daily multivitamin (try the gummies) and a B complex vitamin.  -Schedule follow up with your dentist.  -Schedule follow up with me in 6 months or sooner as needed.     Lelon Frohlich, MD [facility]

## 2020-03-31 NOTE — Patient Instructions (Signed)
-  Nice seeing you today!!  -Lab work today; will notify you once results are available.  -Make sure you take a daily multivitamin (try the gummies) and a B complex vitamin.  -Schedule follow up with your dentist.  -Schedule follow up with me in 6 months or sooner as needed.

## 2020-04-01 LAB — VITAMIN D 25 HYDROXY (VIT D DEFICIENCY, FRACTURES): Vit D, 25-Hydroxy: 53 ng/mL (ref 30–100)

## 2020-05-04 ENCOUNTER — Other Ambulatory Visit: Payer: Self-pay | Admitting: Internal Medicine

## 2020-05-04 DIAGNOSIS — G6289 Other specified polyneuropathies: Secondary | ICD-10-CM

## 2020-05-04 DIAGNOSIS — F411 Generalized anxiety disorder: Secondary | ICD-10-CM

## 2020-05-05 NOTE — Telephone Encounter (Signed)
gabapentin (NEURONTIN) 100 MG capsule  Patient needs the instructions to say to take 100 mg twice daily instead of take 100mg  once daily  ALPRAZolam (XANAX) 0.25 MG tablet    [id number] [place], [place] - [address] Phone:  [phone]  Fax:  [phone]

## 2020-05-06 ENCOUNTER — Telehealth: Payer: Self-pay | Admitting: Internal Medicine

## 2020-05-06 NOTE — Telephone Encounter (Signed)
Patient has a question about her Gabapentin and wants a call back this morning.

## 2020-05-07 ENCOUNTER — Telehealth: Payer: Self-pay | Admitting: Internal Medicine

## 2020-05-07 NOTE — Telephone Encounter (Signed)
Pt is call back she stated she call yesterday and didn't get a call back she stated she will be waiting,

## 2020-05-08 ENCOUNTER — Encounter: Payer: Self-pay | Admitting: Internal Medicine

## 2020-05-08 DIAGNOSIS — G6289 Other specified polyneuropathies: Secondary | ICD-10-CM

## 2020-05-10 MED ORDER — TRAZODONE HCL 50 MG PO TABS
25.0000 mg | ORAL_TABLET | Freq: Every evening | ORAL | 1 refills | Status: DC | PRN
Start: 2020-05-10 — End: 2020-10-13

## 2020-05-10 MED ORDER — GABAPENTIN 100 MG PO CAPS: 100.0000 mg | ORAL_CAPSULE | Freq: Two times a day (BID) | ORAL | 1 refills | Status: DC

## 2020-05-10 NOTE — Addendum Note (Signed)
Addended by: Rodrigo Ran on: 05/10/2020 09:53 AM   Modules accepted: Orders

## 2020-05-10 NOTE — Telephone Encounter (Signed)
See my chart encounter.

## 2020-05-16 ENCOUNTER — Other Ambulatory Visit: Payer: Self-pay | Admitting: Internal Medicine

## 2020-05-26 ENCOUNTER — Other Ambulatory Visit: Payer: Self-pay | Admitting: Internal Medicine

## 2020-05-26 DIAGNOSIS — F411 Generalized anxiety disorder: Secondary | ICD-10-CM

## 2020-07-13 ENCOUNTER — Other Ambulatory Visit: Payer: Self-pay | Admitting: Internal Medicine

## 2020-07-13 DIAGNOSIS — F411 Generalized anxiety disorder: Secondary | ICD-10-CM

## 2020-07-15 NOTE — Telephone Encounter (Signed)
Patient is calling back to check the status of medication refill for ALPRAZolam (XANAX) 0.25 MG tablet. CB is [phone]

## 2020-07-20 NOTE — Telephone Encounter (Signed)
Please deny.  This is a duplicate  °

## 2020-07-21 ENCOUNTER — Encounter: Payer: Self-pay | Admitting: Internal Medicine

## 2020-07-21 NOTE — Addendum Note (Signed)
Addended by: Kern Reap B on: 07/21/2020 03:09 PM   Modules accepted: Orders

## 2020-07-21 NOTE — Telephone Encounter (Signed)
Pt is calling back stating that she called the pharmacy and that they stated to her that the pt had come to the office and picked up a paper script and she stated that is not true. Pt would like to have a call back to discuss further.

## 2020-07-21 NOTE — Telephone Encounter (Signed)
Paper Rx was not picked up.   Refill request forwarded to Dr Ardyth Harps.

## 2020-07-21 NOTE — Telephone Encounter (Signed)
Pt is calling to check the status of the below msg. 

## 2020-07-22 MED ORDER — ALPRAZOLAM 0.25 MG PO TABS: 0.2500 mg | ORAL_TABLET | Freq: Four times a day (QID) | ORAL | 1 refills | Status: DC

## 2020-08-16 ENCOUNTER — Other Ambulatory Visit: Payer: Self-pay | Admitting: Internal Medicine

## 2020-08-16 DIAGNOSIS — F411 Generalized anxiety disorder: Secondary | ICD-10-CM

## 2020-09-08 ENCOUNTER — Other Ambulatory Visit: Payer: Self-pay | Admitting: Internal Medicine

## 2020-09-08 DIAGNOSIS — F411 Generalized anxiety disorder: Secondary | ICD-10-CM

## 2020-09-24 ENCOUNTER — Other Ambulatory Visit: Payer: Self-pay | Admitting: Internal Medicine

## 2020-09-24 DIAGNOSIS — F411 Generalized anxiety disorder: Secondary | ICD-10-CM

## 2020-09-24 NOTE — Telephone Encounter (Signed)
This is a duplicate please deny.

## 2020-09-24 NOTE — Telephone Encounter (Signed)
  ALPRAZolam (XANAX) 0.25 MG tablet  [id number] [place], [place] - [address] Phone:  [phone]  Fax:  [phone]

## 2020-09-27 NOTE — Telephone Encounter (Signed)
Pt is calling in to check the status of the below medication.  Pt would like to have a call back to let her know that it was sent in.

## 2020-09-28 MED ORDER — ALPRAZOLAM 0.25 MG PO TABS: 0.2500 mg | ORAL_TABLET | Freq: Four times a day (QID) | ORAL | 1 refills | Status: DC

## 2020-09-28 NOTE — Addendum Note (Signed)
Addended by: Westley Hummer B on: 09/28/2020 09:35 AM   Modules accepted: Orders

## 2020-09-28 NOTE — Telephone Encounter (Signed)
Patient called following up on her medication refill request   ALPRAZolam (XANAX) 0.25 MG tablet  [id number] [place],  - [address] AT [address] Phone:  [phone]  Fax:  [phone]      She is out of this medication and it's a controlled substance and she needs this medication or you can see her in the hospital.

## 2020-10-13 ENCOUNTER — Other Ambulatory Visit: Payer: Self-pay | Admitting: Internal Medicine

## 2020-10-28 ENCOUNTER — Other Ambulatory Visit: Payer: Self-pay | Admitting: Internal Medicine

## 2020-10-28 DIAGNOSIS — G6289 Other specified polyneuropathies: Secondary | ICD-10-CM

## 2020-11-09 ENCOUNTER — Other Ambulatory Visit: Payer: Self-pay | Admitting: Internal Medicine

## 2020-11-29 ENCOUNTER — Other Ambulatory Visit: Payer: Self-pay | Admitting: Internal Medicine

## 2020-11-29 DIAGNOSIS — F411 Generalized anxiety disorder: Secondary | ICD-10-CM

## 2020-11-30 NOTE — Telephone Encounter (Signed)
Time for a physical.

## 2020-12-01 MED ORDER — ALPRAZOLAM 0.25 MG PO TABS: 0.2500 mg | ORAL_TABLET | Freq: Four times a day (QID) | ORAL | 0 refills | Status: DC

## 2020-12-01 NOTE — Addendum Note (Signed)
Addended by: Westley Hummer B on: 12/01/2020 04:02 PM   Modules accepted: Orders

## 2020-12-01 NOTE — Telephone Encounter (Addendum)
Patient is calling back and has scheduled 02/03/2021 and is requesting a call back regarding medication, please advise. CB is [phone]

## 2020-12-01 NOTE — Telephone Encounter (Signed)
Please approve this refill.  The duplicate has been denied.

## 2020-12-01 NOTE — Telephone Encounter (Signed)
Pt is calling in to check the status of the below msg for her medication being refilled.

## 2020-12-01 NOTE — Telephone Encounter (Signed)
Note on Rx for pharmacist.  I am unable to deny.  Please deny medication.  thanks

## 2020-12-30 ENCOUNTER — Other Ambulatory Visit: Payer: Self-pay | Admitting: Internal Medicine

## 2020-12-30 DIAGNOSIS — F411 Generalized anxiety disorder: Secondary | ICD-10-CM

## 2020-12-30 NOTE — Telephone Encounter (Signed)
Pt is calling in stating that she needs a refill on Rx alprazolam (XANAX) 0.25 MG  [facility]  [facility]

## 2021-02-03 ENCOUNTER — Encounter: Payer: Self-pay | Admitting: Internal Medicine

## 2021-02-03 ENCOUNTER — Other Ambulatory Visit: Payer: Self-pay

## 2021-02-03 ENCOUNTER — Ambulatory Visit (INDEPENDENT_AMBULATORY_CARE_PROVIDER_SITE_OTHER): Payer: Medicare Other | Admitting: Internal Medicine

## 2021-02-03 VITALS — BP 120/80 | HR 46 | Temp 98.1°F | Ht 63.0 in | Wt 119.3 lb

## 2021-02-03 DIAGNOSIS — R413 Other amnesia: Secondary | ICD-10-CM | POA: Diagnosis not present

## 2021-02-03 DIAGNOSIS — H34211 Partial retinal artery occlusion, right eye: Secondary | ICD-10-CM

## 2021-02-03 DIAGNOSIS — E8941 Symptomatic postprocedural ovarian failure: Secondary | ICD-10-CM | POA: Diagnosis not present

## 2021-02-03 DIAGNOSIS — F411 Generalized anxiety disorder: Secondary | ICD-10-CM

## 2021-02-03 DIAGNOSIS — F5101 Primary insomnia: Secondary | ICD-10-CM | POA: Diagnosis not present

## 2021-02-03 DIAGNOSIS — E559 Vitamin D deficiency, unspecified: Secondary | ICD-10-CM | POA: Diagnosis not present

## 2021-02-03 DIAGNOSIS — R400 Somnolence: Secondary | ICD-10-CM | POA: Diagnosis not present

## 2021-02-03 DIAGNOSIS — J302 Other seasonal allergic rhinitis: Secondary | ICD-10-CM

## 2021-02-03 DIAGNOSIS — F339 Major depressive disorder, recurrent, unspecified: Secondary | ICD-10-CM

## 2021-02-03 MED ORDER — ALPRAZOLAM 0.25 MG PO TABS: 0.2500 mg | ORAL_TABLET | Freq: Four times a day (QID) | ORAL | 0 refills | Status: DC

## 2021-02-03 MED ORDER — ESTRADIOL 0.1 MG/GM VA CREA: 1.0000 | TOPICAL_CREAM | VAGINAL | 0 refills | Status: DC

## 2021-02-03 MED ORDER — FLUTICASONE PROPIONATE 50 MCG/ACT NA SUSP: 2.0000 | Freq: Every day | NASAL | 6 refills | Status: AC

## 2021-02-03 NOTE — Patient Instructions (Signed)
-  Nice seeing you today!!  -Lab work today; will notify you once results are available.

## 2021-02-03 NOTE — Progress Notes (Signed)
Established Patient Office Visit     This visit occurred during the SARS-CoV-2 public health emergency.  Safety protocols were in place, including screening questions prior to the visit, additional usage of staff PPE, and extensive cleaning of exam room while observing appropriate contact time as indicated for disinfecting solutions.    CC/Reason for Visit: Discuss mental health  HPI: Elizabeth Ward is a 75 y.o. female who is coming in today for the above mentioned reasons. Past Medical History is significant for: Anxiety and depression, insomnia and vitamin D deficiency.  This visit was initially scheduled as her annual physical, however this will be rescheduled due to multitude of acute issues discussed today.  Her husband has unfortunately developed paralysis due to Devic's disease and remains in a skilled nursing facility.  She is the primary caregiver for her 36 year old grandson that she obtained custody of years ago after the death of her son.  Lately she has been struggling more with her depression.  She feels like the medications that were previously working well for her are not anymore.  In addition she has severe insomnia.  She has also developed severe peripheral neuropathy and is on gabapentin.  She is on alprazolam, gabapentin, trazodone, fluoxetine, nortriptyline.  She states that she is routinely extremely drowsy in the morning.  She has to drink several cups of coffee before she feels alert enough to drive her grandson to school.  In addition, lately she has noticed increased memory difficulty and is concerned that she might be developing Alzheimer's dementia.  Past Medical/Surgical History: Past Medical History:  Diagnosis Date   ANXIETY 08/14/2008   INSOMNIA 09/07/2009   Irritable bowel syndrome 09/07/2009   MENOPAUSE, SURGICAL 08/14/2008   Shingles    UNSPECIFIED VAGINITIS AND VULVOVAGINITIS 06/04/2010    Past Surgical History:  Procedure Laterality Date   ABDOMINAL  HYSTERECTOMY     CATARACT EXTRACTION Right    COLECTOMY     partial   EYE SURGERY     TONSILLECTOMY     TONSILLECTOMY      Social History:  reports that she has never smoked. She has never used smokeless tobacco. She reports current alcohol use of about 7.0 standard drinks of alcohol per week. She reports that she does not use drugs.  Allergies: Allergies  Allergen Reactions   Prochlorperazine Edisylate Other (See Comments)    Tongue spasms(tongue would droop back in to the throat    Tape Rash    Family History:  Family History  Problem Relation Age of Onset   Hodgkin's lymphoma Mother    Lung cancer Father    Heart attack Son 57     Current Outpatient Medications:    estradiol (ESTRACE) 1 MG tablet, TAKE 1 TABLET BY MOUTH EVERY DAY, Disp: 90 tablet, Rfl: 1   FLUoxetine (PROZAC) 10 MG tablet, TAKE 1 TABLET BY MOUTH EVERY DAY, Disp: 90 tablet, Rfl: 1   gabapentin (NEURONTIN) 100 MG capsule, TAKE 1 CAPSULE BY MOUTH TWICE A DAY, Disp: 180 capsule, Rfl: 1   ibuprofen (ADVIL,MOTRIN) 600 MG tablet, TAKE ONE TABLET BY MOUTH EVERY 6-8 HOURS AS NEEDED FOR PAIN, Disp: , Rfl: 0   traZODone (DESYREL) 50 MG tablet, TAKE 0.5 TABLETS (25 MG TOTAL) BY MOUTH AT BEDTIME AS NEEDED., Disp: 45 tablet, Rfl: 1   ALPRAZolam (XANAX) 0.25 MG tablet, Take 1 tablet (0.25 mg total) by mouth 4 (four) times daily., Disp: 120 tablet, Rfl: 0   aspirin EC 81 MG tablet,  Take 1 tablet (81 mg total) by mouth daily. (Patient not taking: Reported on 02/03/2021), Disp: 1 tablet, Rfl: 0   estradiol (ESTRACE) 0.1 MG/GM vaginal cream, Place 1 Applicatorful vaginally 2 (two) times a week., Disp: 127.5 g, Rfl: 0   fluticasone (FLONASE) 50 MCG/ACT nasal spray, Place 2 sprays into both nostrils daily., Disp: 16 g, Rfl: 6  Review of Systems:  Constitutional: Denies fever, chills, diaphoresis, appetite change and fatigue.  HEENT: Denies photophobia, eye pain, redness, hearing loss, ear pain, congestion, sore throat,  rhinorrhea, sneezing, mouth sores, trouble swallowing, neck pain, neck stiffness and tinnitus.   Respiratory: Denies SOB, DOE, cough, chest tightness,  and wheezing.   Cardiovascular: Denies chest pain, palpitations and leg swelling.  Gastrointestinal: Denies nausea, vomiting, abdominal pain, diarrhea, constipation, blood in stool and abdominal distention.  Genitourinary: Denies dysuria, urgency, frequency, hematuria, flank pain and difficulty urinating.  Endocrine: Denies: hot or cold intolerance, sweats, changes in hair or nails, polyuria, polydipsia. Musculoskeletal: Denies myalgias, back pain, joint swelling, arthralgias and gait problem.  Skin: Denies pallor, rash and wound.  Neurological: Denies dizziness, seizures, syncope, weakness, light-headedness, numbness and headaches.  Hematological: Denies adenopathy. Easy bruising, personal or family bleeding history  Psychiatric/Behavioral: Denies suicidal ideation,  confusion and agitation    Physical Exam: Vitals:   02/03/21 1314  BP: 120/80  Pulse: (!) 46  Temp: 98.1 F (36.7 C)  TempSrc: Oral  SpO2: 98%  Weight: 119 lb 4.8 oz (54.1 kg)  Height: 5\' 3"  (1.6 m)    Body mass index is 21.13 kg/m.   Constitutional: NAD, calm, comfortable Eyes: PERRL, lids and conjunctivae normal, wears corrective lenses ENMT: Mucous membranes are moist.  Respiratory: clear to auscultation bilaterally, no wheezing, no crackles. Normal respiratory effort. No accessory muscle use.  Cardiovascular: Regular rate and rhythm, no murmurs / rubs / gallops. No extremity edema.  Neurologic: Grossly intact and nonfocal Psychiatric: Normal judgment and insight. Alert and oriented x 3. Normal mood.   [facility] Office Visit from 02/03/2021 in [facility]  PHQ-9 Total Score 14         Impression and Plan:  Primary insomnia - Plan: Ambulatory referral to Psychiatry  Anxiety state - Plan: ALPRAZolam (XANAX) 0.25 MG tablet,  Ambulatory referral to Psychiatry  MENOPAUSE, SURGICAL - Plan: estradiol (ESTRACE) 0.1 MG/GM vaginal cream  Depression, recurrent ([place]) - Plan: Ambulatory referral to Psychiatry, AMB Referral to [facility]  Memory loss - Plan: CBC with Differential/Platelet, Comprehensive metabolic panel, TSH, Vitamin B12, Ambulatory referral to Neuropsychology, Ambulatory referral to Psychiatry  Drowsiness - Plan: Ambulatory referral to Psychiatry  Vitamin D deficiency - Plan: VITAMIN D 25 Hydroxy (Vit-D Deficiency, Fractures)  Cholesterol retinal embolus of right eye - Plan: Lipid panel  Seasonal allergies - Plan: fluticasone (FLONASE) 50 MCG/ACT nasal spray  -I am concerned about her polypharmacy especially if she feels like her depression is not well controlled in addition to her having sedating side effects. -I think referral to psychiatry is in order to tailor down her medication regimen as she seems reluctant to drop down doses of any of her psychotropic medications. -In addition I think referral for neuropsychologic evaluation would be of benefit given what she describes as cognitive issues.  I will order B12, TSH, vitamin D to make sure these are not playing a role.  Certainly her depression could be playing a role in her cognition. -I will also request care management and social work input to see if there are  any resources available for her and her grandson.  Time spent: 34 minutes reviewing chart, interviewing and examining patient and formulating plan of care.   Patient Instructions  -Nice seeing you today!!  -Lab work today; will notify you once results are available.      Lelon Frohlich, MD [facility] at [facility]

## 2021-02-04 ENCOUNTER — Telehealth: Payer: Self-pay | Admitting: *Deleted

## 2021-02-04 LAB — CBC WITH DIFFERENTIAL/PLATELET
Basophils Absolute: 0.1 10*3/uL (ref 0.0–0.1)
Basophils Relative: 1.3 % (ref 0.0–3.0)
Eosinophils Absolute: 0.1 10*3/uL (ref 0.0–0.7)
Eosinophils Relative: 1.3 % (ref 0.0–5.0)
HCT: 37.6 % (ref 36.0–46.0)
Hemoglobin: 13 g/dL (ref 12.0–15.0)
Lymphocytes Relative: 21 % (ref 12.0–46.0)
Lymphs Abs: 1.3 10*3/uL (ref 0.7–4.0)
MCHC: 34.4 g/dL (ref 30.0–36.0)
MCV: 86.9 fl (ref 78.0–100.0)
Monocytes Absolute: 0.6 10*3/uL (ref 0.1–1.0)
Monocytes Relative: 9.1 % (ref 3.0–12.0)
Neutro Abs: 4.2 10*3/uL (ref 1.4–7.7)
Neutrophils Relative %: 67.3 % (ref 43.0–77.0)
Platelets: 274 10*3/uL (ref 150.0–400.0)
RBC: 4.33 Mil/uL (ref 3.87–5.11)
RDW: 13.3 % (ref 11.5–15.5)
WBC: 6.2 10*3/uL (ref 4.0–10.5)

## 2021-02-04 LAB — COMPREHENSIVE METABOLIC PANEL
ALT: 14 U/L (ref 0–35)
AST: 17 U/L (ref 0–37)
Albumin: 4.1 g/dL (ref 3.5–5.2)
Alkaline Phosphatase: 54 U/L (ref 39–117)
BUN: 15 mg/dL (ref 6–23)
CO2: 29 mEq/L (ref 19–32)
Calcium: 9.1 mg/dL (ref 8.4–10.5)
Chloride: 96 mEq/L (ref 96–112)
Creatinine, Ser: 0.82 mg/dL (ref 0.40–1.20)
GFR: 70.92 mL/min (ref >60.00)
Glucose, Bld: 90 mg/dL (ref 70–99)
Potassium: 4.7 mEq/L (ref 3.5–5.1)
Sodium: 133 mEq/L — ABNORMAL LOW (ref 135–145)
Total Bilirubin: 0.5 mg/dL (ref 0.2–1.2)
Total Protein: 6.8 g/dL (ref 6.0–8.3)

## 2021-02-04 LAB — LIPID PANEL
Cholesterol: 188 mg/dL (ref 0–200)
HDL: 81.7 mg/dL (ref >39.00)
LDL Cholesterol: 66 mg/dL (ref 0–99)
NonHDL: 105.83
Total CHOL/HDL Ratio: 2
Triglycerides: 198 mg/dL — ABNORMAL HIGH (ref 0.0–149.0)
VLDL: 39.6 mg/dL (ref 0.0–40.0)

## 2021-02-04 LAB — VITAMIN B12: Vitamin B-12: 618 pg/mL (ref 211–911)

## 2021-02-04 LAB — TSH: TSH: 1.51 u[IU]/mL (ref 0.35–5.50)

## 2021-02-04 LAB — VITAMIN D 25 HYDROXY (VIT D DEFICIENCY, FRACTURES): VITD: 20.44 ng/mL — ABNORMAL LOW (ref 30.00–100.00)

## 2021-02-04 NOTE — Chronic Care Management (AMB) (Signed)
  Chronic Care Management   Note  02/04/2021 Name: MARLENI GALLARDO MRN: [id number] DOB: 02/28/1946  [name] is a 75 y.o. year old female who is a primary care patient of Isaac Bliss, Rayford Halsted, MD. I reached out to Orene Desanctis by phone today in response to a referral sent by Ms. Maryann Alar Cloninger's PCP, Isaac Bliss, Rayford Halsted, MD      [name] was given information about Chronic Care Management services today including:  CCM service includes personalized support from designated clinical staff supervised by her physician, including individualized plan of care and coordination with other care providers 24/7 contact phone numbers for assistance for urgent and routine care needs. Service will only be billed when office clinical staff spend 20 minutes or more in a month to coordinate care. Only one practitioner may furnish and bill the service in a calendar month. The patient may stop CCM services at any time (effective at the end of the month) by phone call to the office staff. The patient will be responsible for cost sharing (co-pay) of up to 20% of the service fee (after annual deductible is met).  Patient agreed to services and verbal consent obtained.   Follow up plan: Telephone appointment with care management team member scheduled for:02/25/2021  Joscelyne Renville  Care Guide, Embedded Care Coordination [place]  Care Management

## 2021-02-08 ENCOUNTER — Other Ambulatory Visit: Payer: Self-pay | Admitting: Internal Medicine

## 2021-02-08 ENCOUNTER — Encounter: Payer: Self-pay | Admitting: Internal Medicine

## 2021-02-08 DIAGNOSIS — E781 Pure hyperglyceridemia: Secondary | ICD-10-CM | POA: Insufficient documentation

## 2021-02-08 DIAGNOSIS — E559 Vitamin D deficiency, unspecified: Secondary | ICD-10-CM

## 2021-02-08 MED ORDER — VITAMIN D (ERGOCALCIFEROL) 1.25 MG (50000 UNIT) PO CAPS: 50000.0000 [IU] | ORAL_CAPSULE | ORAL | 0 refills | Status: AC

## 2021-02-08 MED ORDER — FISH OIL 1000 MG PO CAPS
1.0000 | ORAL_CAPSULE | Freq: Two times a day (BID) | ORAL | 0 refills | Status: DC
Start: 2021-02-08 — End: 2021-03-30

## 2021-02-21 ENCOUNTER — Encounter (INDEPENDENT_AMBULATORY_CARE_PROVIDER_SITE_OTHER): Payer: Medicare Other | Admitting: Ophthalmology

## 2021-02-25 ENCOUNTER — Ambulatory Visit (INDEPENDENT_AMBULATORY_CARE_PROVIDER_SITE_OTHER): Payer: Medicare Other

## 2021-02-25 DIAGNOSIS — F339 Major depressive disorder, recurrent, unspecified: Secondary | ICD-10-CM | POA: Diagnosis not present

## 2021-02-25 DIAGNOSIS — E781 Pure hyperglyceridemia: Secondary | ICD-10-CM

## 2021-02-25 DIAGNOSIS — E559 Vitamin D deficiency, unspecified: Secondary | ICD-10-CM

## 2021-02-25 DIAGNOSIS — F411 Generalized anxiety disorder: Secondary | ICD-10-CM

## 2021-02-25 DIAGNOSIS — G629 Polyneuropathy, unspecified: Secondary | ICD-10-CM

## 2021-02-25 NOTE — Chronic Care Management (AMB) (Signed)
Chronic Care Management   CCM RN Visit Note  02/25/2021 Name: Elizabeth Ward MRN: [id number] DOB: [birth year]  Subjective: Elizabeth Ward is a 75 y.o. year old female who is a primary care patient of Isaac Bliss, Rayford Halsted, MD. The care management team was consulted for assistance with disease management and care coordination needs.    Engaged with patient by telephone for initial visit in response to provider referral for case management and/or care coordination services.   Consent to Services:  The patient was given the following information about Chronic Care Management services today, agreed to services, and gave verbal consent: 1. CCM service includes personalized support from designated clinical staff supervised by the primary care provider, including individualized plan of care and coordination with other care providers 2. 24/7 contact phone numbers for assistance for urgent and routine care needs. 3. Service will only be billed when office clinical staff spend 20 minutes or more in a month to coordinate care. 4. Only one practitioner may furnish and bill the service in a calendar month. 5.The patient may stop CCM services at any time (effective at the end of the month) by phone call to the office staff. 6. The patient will be responsible for cost sharing (co-pay) of up to 20% of the service fee (after annual deductible is met). Patient agreed to services and consent obtained.  Patient agreed to services and verbal consent obtained.   Assessment: Review of patient past medical history, allergies, medications, health status, including review of consultants reports, laboratory and other test data, was performed as part of comprehensive evaluation and provision of chronic care management services.   SDOH (Social Determinants of Health) assessments and interventions performed:  SDOH Interventions    Flowsheet Row Most Recent Value  SDOH Interventions   Food Insecurity Interventions  Intervention Not Indicated  Housing Interventions Intervention Not Indicated  Stress Interventions Patient Refused  [declines LCSW referral]  Transportation Interventions Intervention Not Indicated  Depression Interventions/Treatment  Medication  [declines LSCW referral]        CCM Care Plan  Allergies  Allergen Reactions   Prochlorperazine Edisylate Other (See Comments)    Tongue spasms(tongue would droop back in to the throat    Tape Rash    Outpatient Encounter Medications as of 02/25/2021  Medication Sig   ALPRAZolam (XANAX) 0.25 MG tablet Take 1 tablet (0.25 mg total) by mouth 4 (four) times daily.   estradiol (ESTRACE) 0.1 MG/GM vaginal cream Place 1 Applicatorful vaginally 2 (two) times a week.   estradiol (ESTRACE) 1 MG tablet TAKE 1 TABLET BY MOUTH EVERY DAY   FLUoxetine (PROZAC) 10 MG tablet TAKE 1 TABLET BY MOUTH EVERY DAY   fluticasone (FLONASE) 50 MCG/ACT nasal spray Place 2 sprays into both nostrils daily.   gabapentin (NEURONTIN) 100 MG capsule TAKE 1 CAPSULE BY MOUTH TWICE A DAY   ibuprofen (ADVIL,MOTRIN) 600 MG tablet TAKE ONE TABLET BY MOUTH EVERY 6-8 HOURS AS NEEDED FOR PAIN   traZODone (DESYREL) 50 MG tablet TAKE 0.5 TABLETS (25 MG TOTAL) BY MOUTH AT BEDTIME AS NEEDED.   Vitamin D, Ergocalciferol, (DRISDOL) 1.25 MG (50000 UNIT) CAPS capsule Take 1 capsule (50,000 Units total) by mouth every 7 (seven) days for 12 doses.   aspirin EC 81 MG tablet Take 1 tablet (81 mg total) by mouth daily. (Patient not taking: No sig reported)   Omega-3 Fatty Acids (FISH OIL) 1000 MG CAPS Take 1 capsule (1,000 mg total) by mouth in the morning and at  bedtime. (Patient not taking: Reported on 02/25/2021)   No facility-administered encounter medications on file as of 02/25/2021.    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    Vitreous floaters, left 02/19/2020   Pseudophakia 02/19/2020   Vitamin D deficiency 12/19/2019   Lamellar macular hole of right eye 02/14/2016   Macular puckering, left eye 02/14/2016   Nuclear sclerotic cataract of both eyes 02/14/2016   Vitreous floaters of both eyes 02/14/2016   IRRITABLE BOWEL SYNDROME 09/07/2009   INSOMNIA 09/07/2009   Anxiety state 08/14/2008   MENOPAUSE, SURGICAL 08/14/2008    Conditions to be addressed/monitored:Hypertriglyceridemia, Anxiety, and Depression  Care Plan : RNCM:Hypertriglyceridemia  Updates made by Dimitri Ped, RN since 02/25/2021 12:00 AM     Problem: Lack of long term self management of Hypertriglyceridemia   Priority: Medium     Long-Range Goal: Effective self management of Hypertriglyceridemia   Start Date: 02/25/2021  Expected End Date: 07/24/2021  This Visit's Progress: On track  Priority: Medium  Note:   Current Barriers:  Poorly controlled hyperlipidemia/hypertriglyceridemia, complicated by depression, anxiety, vitamin D deficiency  Current antihyperlipidemic regimen: fish oil stopped taking due to side effects Most recent lipid panel:     Component Value Date/Time   CHOL 188 02/03/2021 1356   TRIG 198.0 (H) 02/03/2021 1356   HDL 81.70 02/03/2021 1356   CHOLHDL 2 02/03/2021 1356   VLDL 39.6 02/03/2021 1356   LDLCALC 66 02/03/2021 1356   ASCVD risk enhancing conditions: age >19 Unable to independently  self manage Hypertriglyceridemia States she tried taking the fish oil for a week or two and it made her have GI symptoms that she could not tolerate .  States her GI issues went away when she stopped taking the fish oil.  States she does not eat healthy as she tries to have food that her 103 yr old grandson likes.  States she has not been exercising as she feels she does not have time and her feet and legs ache by the end of the day. RN Care Manager Clinical Goal(s):  patient will work with Consulting civil engineer, providers, and care  team towards execution of optimized self-health management plan patient will verbalize understanding of plan for  self management of Hypertriglyceridemia patient will meet with RN Care Manager to address self management of Hypertriglyceridemia patient will demonstrate improved adherence to prescribed treatment plan for self management of Hypertriglyceridemia patient will verbalize basic understanding of Hypertriglyceridemia disease process and self health management plan the patient will demonstrate ongoing self health care management ability Interventions: Collaboration with Isaac Bliss, Rayford Halsted, MD regarding development and update of comprehensive plan of care as evidenced by provider attestation and co-signature Inter-disciplinary care team collaboration (see longitudinal plan of care) Medication review performed; medication list updated in electronic medical record.  Inter-disciplinary care team collaboration (see longitudinal plan of care) Evaluation of current treatment plan related to self management of Hypertriglyceridemia and patient's adherence to plan as established by provider. Provided education to patient re: self management of Hypertriglyceridemia Provided patient with written educational materials related to self management of Hypertriglyceridemia Discussed plans with patient for ongoing care management follow up and provided patient with direct contact information for care management team Reviewed importance of exercise to help decrease triglycerides and to help with her stress level Patient Goals/Self-Care Activities:  - change to whole  grain breads, cereal, pasta - drink 6 to 8 glasses of water each day - eat fish at least once per week - fill half the plate with nonstarchy vegetables - limit fast food meals to no more than 1 per week - prepare main meal at home 3 to 5 days each week - read food labels for fat, fiber, carbohydrates and portion size - reduce red meat  to 2 to 3 times a week - switch to low-fat or skim milk - be open to making changes - eat smaller or less servings of red meat - get blood test (fasting) done 1 week before next visit - increase the amount of fiber in food - read food labels for fat and fiber - switch to low-fat or skim milk Follow Up Plan: Telephone follow up appointment with care management team member scheduled for: 04/07/21 at 10 AM The patient has been provided with contact information for the care management team and has been advised to call with any health related questions or concerns.       Care Plan : Depression/Anxiety  Updates made by Dimitri Ped, RN since 02/25/2021 12:00 AM     Problem: Lack of long term self management of Depression/Anxiety   Priority: High     Long-Range Goal: Improved self management of Depression/Anxiety   Start Date: 02/25/2021  Expected End Date: 07/24/2021  This Visit's Progress: On track  Priority: High  Note:   Current Barriers:  Ineffective Self Health Maintenance in a patient with Anxiety and Depression Unable to independently self manage of Depression/Anxiety  States she her mood is better since she saw Dr. Jerilee Hoh last month.  States she decided she did not want to have the psychiatry referral yet.  States she is still dealing with a lot of stress with caring for her 64 yr old grandson and her husband who is in the nursing home.  States she divides out her medications at night to help her get back to sleep at night.  States she is a little more awake in the morning.  States she still has foot and leg pain later in the day that the gabapentin sometimes helps Clinical Goal(s):  Collaboration with Isaac Bliss, Rayford Halsted, MD regarding development and update of comprehensive plan of care as evidenced by provider attestation and co-signature Inter-disciplinary care team collaboration (see longitudinal plan of care) patient will work with care management team to address  care coordination and chronic disease management needs related to Educational Needs Care Coordination Psychosocial Support   Interventions:  Evaluation of current treatment plan related to Anxiety and Depression, Limited social support and Mental Health Concerns  self-management and patient's adherence to plan as established by provider. Collaboration with Isaac Bliss, Rayford Halsted, MD regarding development and update of comprehensive plan of care as evidenced by provider attestation       and co-signature Inter-disciplinary care team collaboration (see longitudinal plan of care) Discussed plans with patient for ongoing care management follow up and provided patient with direct contact information for care management team Reviewed to take medications as prescribed Discussed ways to decrease stress and the importance of taking care of herself in order to be able to be a caregiver. Reviewed that CCM LCSW could provider her with counseling for her depression, anxiety and stress but declines at this time Encouraged to reconsider referral to psychiatry to regulate medications but declines at this time Declines CCM Pharm D referral at this time Self Care Activities:  Self  administers medications as prescribed Attends all scheduled provider appointments Calls pharmacy for medication refills Attends church or other social activities Calls provider office for new concerns or questions Patient Goals: - call and visit an old friend - learn and use visualization or guided imagery - practice relaxation or meditation daily - start or continue a personal journal - talk about feelings with a friend, family or spiritual advisor - practice positive thinking and self-talk Follow Up Plan: Telephone follow up appointment with care management team member scheduled for: 04/07/21 at 10 AM The patient has been provided with contact information for the care management team and has been advised to call with any  health related questions or concerns.       Plan:Telephone follow up appointment with care management team member scheduled for:  04/07/21 and The patient has been provided with contact information for the care management team and has been advised to call with any health related questions or concerns.  Peter Garter RN, Jackquline Denmark, CDE Care Management Coordinator [facility] [phone], Mobile [phone]

## 2021-02-25 NOTE — Patient Instructions (Signed)
Visit Information   PATIENT GOALS:   Goals Addressed             This Visit's Progress    RNCM:Manage My Emotions   On track    Timeframe:  Long-Range Goal Priority:  High Start Date:    02/25/21                         Expected End Date:     07/24/21                  Follow Up Date 04/07/21    - call and visit an old friend - learn and use visualization or guided imagery - practice relaxation or meditation daily - start or continue a personal journal - talk about feelings with a friend, family or spiritual advisor - practice positive thinking and self-talk -consider speaking with CCM LCSW   Why is this important?   When you are stressed, down or upset, your body reacts too.  For example, your blood pressure may get higher; you may have a headache or stomachache.  When your emotions get the best of you, your body's ability to fight off cold and flu gets weak.  These steps will help you manage your emotions.     Notes:      RNCM:Manage My Triglycerides   On track    Timeframe:  Long-Range Goal Priority:  Medium Start Date:    02/25/21                         Expected End Date:    07/24/21                   Follow Up Date 04/07/21    - change to whole grain breads, cereal, pasta - eat smaller or less servings of red meat - fill half the plate with nonstarchy vegetables - get blood test (fasting) done 1 week before next visit - increase the amount of fiber in food - read food labels for fat and fiber - switch to low-fat or skim milk    Why is this important?   Changing cholesterol starts with eating heart-healthy foods.  Other steps may be to increase your activity and to quit if you smoke.    Notes:        Managing Anxiety, Adult After being diagnosed with an anxiety disorder, you may be relieved to know why you have felt or behaved a certain way. You may also feel overwhelmed about the treatment ahead and what it will mean for your life. With care and support, youcan  manage this condition and recover from it. How to manage lifestyle changes Managing stress and anxiety  Stress is your body's reaction to life changes and events, both good and bad. Most stress will last just a few hours, but stress can be ongoing and can lead to more than just stress. Although stress can play a major role in anxiety, it is not the same as anxiety. Stress is usually caused by something external, such as a deadline, test, or competition. Stress normally passes after thetriggering event has ended.  Anxiety is caused by something internal, such as imagining a terrible outcome or worrying that something will go wrong that will devastate you. Anxiety often does not go away even after the triggering event is over, and it can become long-term (chronic) worry. It is important to understand the differences  between stress and anxiety and to manage your stress effectively so that it does not lead to ananxious response. Talk with your health care provider or a counselor to learn more about reducing anxiety and stress. He or she may suggest tension reduction techniques, such as: Music therapy. This can include creating or listening to music that you enjoy and that inspires you. Mindfulness-based meditation. This involves being aware of your normal breaths while not trying to control your breathing. It can be done while sitting or walking. Centering prayer. This involves focusing on a word, phrase, or sacred image that means something to you and brings you peace. Deep breathing. To do this, expand your stomach and inhale slowly through your nose. Hold your breath for 3-5 seconds. Then exhale slowly, letting your stomach muscles relax. Self-talk. This involves identifying thought patterns that lead to anxiety reactions and changing those patterns. Muscle relaxation. This involves tensing muscles and then relaxing them. Choose a tension reduction technique that suits your lifestyle and personality.  These techniques take time and practice. Set aside 5-15 minutes a day to do them. Therapists can offer counseling and training in these techniques. The training to help with anxiety may be covered by some insurance plans. Other things you can do to manage stress and anxiety include: Keeping a stress/anxiety diary. This can help you learn what triggers your reaction and then learn ways to manage your response. Thinking about how you react to certain situations. You may not be able to control everything, but you can control your response. Making time for activities that help you relax and not feeling guilty about spending your time in this way. Visual imagery and yoga can help you stay calm and relax.  Medicines Medicines can help ease symptoms. Medicines for anxiety include: Anti-anxiety drugs. Antidepressants. Medicines are often used as a primary treatment for anxiety disorder. Medicines will be prescribed by a health care provider. When used together, medicines, psychotherapy, and tension reduction techniques may be the most effectivetreatment. Relationships Relationships can play a big part in helping you recover. Try to spend more time connecting with trusted friends and family members. Consider going to couples counseling, taking family education classes, or going to familytherapy. Therapy can help you and others better understand your condition. How to recognize changes in your anxiety Everyone responds differently to treatment for anxiety. Recovery from anxiety happens when symptoms decrease and stop interfering with your daily activities at home or work. This may mean that you will start to: Have better concentration and focus. Worry will interfere less in your daily thinking. Sleep better. Be less irritable. Have more energy. Have improved memory. It is important to recognize when your condition is getting worse. Contact your health care provider if your symptoms interfere with home or  work and you feellike your condition is not improving. Follow these instructions at home: Activity Exercise. Most adults should do the following: Exercise for at least 150 minutes each week. The exercise should increase your heart rate and make you sweat (moderate-intensity exercise). Strengthening exercises at least twice a week. Get the right amount and quality of sleep. Most adults need 7-9 hours of sleep each night. Lifestyle  Eat a healthy diet that includes plenty of vegetables, fruits, whole grains, low-fat dairy products, and lean protein. Do not eat a lot of foods that are high in solid fats, added sugars, or salt. Make choices that simplify your life. Do not use any products that contain nicotine or tobacco, such as cigarettes,  e-cigarettes, and chewing tobacco. If you need help quitting, ask your health care provider. Avoid caffeine, alcohol, and certain over-the-counter cold medicines. These may make you feel worse. Ask your pharmacist which medicines to avoid.  General instructions Take over-the-counter and prescription medicines only as told by your health care provider. Keep all follow-up visits as told by your health care provider. This is important. Where to find support You can get help and support from these sources: Self-help groups. Online and [employer]. A trusted spiritual leader. Couples counseling. Family education classes. Family therapy. Where to find more information You may find that joining a support group helps you deal with your anxiety. The following sources can help you locate counselors or support groups near you: [place]: www.mentalhealthamerica.net Anxiety and Depression Association of [place] (ADAA): https://www.clark.net/ National Alliance on Mental Illness (NAMI): www.nami.org Contact a health care provider if you: Have a hard time staying focused or finishing daily tasks. Spend many hours a day feeling worried about everyday  life. Become exhausted by worry. Start to have headaches, feel tense, or have nausea. Urinate more than normal. Have diarrhea. Get help right away if you have: A racing heart and shortness of breath. Thoughts of hurting yourself or others. If you ever feel like you may hurt yourself or others, or have thoughts about taking your own life, get help right away. You can go to your nearest emergency department or call: Your local emergency services (911 in the [place] A suicide crisis helpline, such as the [place] at 458-605-8886. This is open 24 hours a day. Summary Taking steps to learn and use tension reduction techniques can help calm you and help prevent triggering an anxiety reaction. When used together, medicines, psychotherapy, and tension reduction techniques may be the most effective treatment. Family, friends, and partners can play a big part in helping you recover from an anxiety disorder. This information is not intended to replace advice given to you by your health care provider. Make sure you discuss any questions you have with your healthcare provider. Document Revised: 12/10/2018 Document Reviewed: 12/10/2018 Elsevier Patient Education  [place].  High Triglycerides Eating Plan Triglycerides are a type of fat in the blood. High levels of triglycerides can increase your risk of heart disease and stroke. If your triglyceride levels are high, choosing the right foods can help lower your triglycerides and keep your heart healthy. Work with your health care provider or a diet and nutrition specialist (dietitian) to develop an eating plan that is right for you. What are tips for following this plan? General guidelines  Lose weight, if you are overweight. For most people, losing 5-10 lbs (2-5 kg) helps lower triglyceride levels. A weight-loss plan may include. 30 minutes of exercise at least 5 days a week. Reducing the amount of calories, sugar,  and fat you eat. Eat a wide variety of fresh fruits, vegetables, and whole grains. These foods are high in fiber. Eat foods that contain healthy fats, such as fatty fish, nuts, seeds, and olive oil. Avoid foods that are high in added sugar, added salt (sodium), saturated fat, and trans fat. Avoid low-fiber, refined carbohydrates such as white bread, crackers, noodles, and white rice. Avoid foods with partially hydrogenated oils (trans fats), such as fried foods or stick margarine. Limit alcohol intake to no more than 1 drink a day for nonpregnant women and 2 drinks a day for men. One drink equals 12 oz of beer, 5 oz of wine,  or 1 oz of hard liquor. Your health care provider may recommend that you drink less depending on your overall health.  Reading food labels Check food labels for the amount of saturated fat. Choose foods with no or very little saturated fat. Check food labels for the amount of trans fat. Choose foods with no trans fat. Check food labels for the amount of cholesterol. Choose foods low in cholesterol. Ask your dietitian how much cholesterol you should have each day. Check food labels for the amount of sodium. Choose foods with less than 140 milligrams (mg) per serving. Shopping Buy dairy products labeled as nonfat (skim) or low-fat (1%). Avoid buying processed or prepackaged foods. These are often high in added sugar, sodium, and fat. Cooking Choose healthy fats when cooking, such as olive oil or canola oil. Cook foods using lower fat methods, such as baking, broiling, boiling, or grilling. Make your own sauces, dressings, and marinades when possible, instead of buying them. Store-bought sauces, dressings, and marinades are often high in sodium and sugar. Meal planning Eat more home-cooked food and less restaurant, buffet, and fast food. Eat fatty fish at least 2 times each week. Examples of fatty fish include salmon, trout, mackerel, tuna, and herring. If you eat whole  eggs, do not eat more than 3 egg yolks per week. What foods are recommended? The items listed may not be a complete list. Talk with your dietitian aboutwhat dietary choices are best for you. Grains Whole wheat or whole grain breads, crackers, cereals, and pasta. Unsweetenedoatmeal. Bulgur. Barley. Quinoa. Brown rice. Whole wheat flour tortillas. Vegetables Fresh or frozen vegetables. Low-sodium canned vegetables. Fruits All fresh, canned (in natural juice), or frozen fruits. Meats and other protein foods Skinless chicken or [place]. Ground chicken or [place]. Lean cuts of pork, trimmed of fat. Fish and seafood, especially salmon, trout, and herring. Egg whites. Dried beans, peas, or lentils. Unsalted nuts or seeds. Unsalted cannedbeans. Natural peanut or almond butter. Dairy Low-fat dairy products. Skim or low-fat (1%) milk. Reduced fat (2%) and low-sodium cheese. Low-fat ricotta cheese. Low-fat cottage cheese. Plain,low-fat yogurt. Fats and oils Tub margarine without trans fats. Light or reduced-fat mayonnaise. Light or reduced-fat salad dressings.Avocado. Safflower, olive, sunflower, soybean, and canola oils. What foods are not recommended? The items listed may not be a complete list. Talk with your dietitian aboutwhat dietary choices are best for you. Grains White bread. White (regular) pasta. White rice. Cornbread. Bagels. Pastries. Crackers that contain trans fat. Vegetables Creamed or fried vegetables. Vegetables in a cheese sauce. Fruits Sweetened dried fruit. Canned fruit in syrup. Fruit juice. Meats and other protein foods Fatty cuts of meat. Ribs. Chicken wings. Berniece Salines. Sausage. Bologna. Salami.Chitterlings. Fatback. Hot dogs. Bratwurst. Packaged lunch meats. Dairy Whole or reduced-fat (2%) milk. Half-and-half. Cream cheese. Full-fat or sweetened yogurt. Full-fat cheese. Nondairy creamers. Whipped toppings.Processed cheese or cheese spreads. Cheese curds. Beverages Alcohol.  Sweetened drinks, such as soda, lemonade, fruit drinks, or punches. Fats and oils Butter. Stick margarine. Lard. Shortening. Ghee. Bacon fat. Tropical oils, suchas coconut, palm kernel, or palm oils. Sweets and desserts Corn syrup. Sugars. Honey. Molasses. Candy. Jam and jelly. Syrup. Sweetenedcereals. Cookies. Pies. Cakes. Donuts. Muffins. Ice cream. Condiments Store-bought sauces, dressings, and marinades that are high in sugar, such asketchup and barbecue sauce. Summary High levels of triglycerides can increase the risk of heart disease and stroke. Choosing the right foods can help lower your triglycerides. Eat plenty of fresh fruits, vegetables, and whole grains. Choose low-fat dairy and lean meats.  Eat fatty fish at least twice a week. Avoid processed and prepackaged foods with added sugar, sodium, saturated fat, and trans fat. If you need suggestions or have questions about what types of food are good for you, talk with your health care provider or a dietitian. This information is not intended to replace advice given to you by your health care provider. Make sure you discuss any questions you have with your healthcare provider. Document Revised: 11/10/2019 Document Reviewed: 11/12/2019 Elsevier Patient Education  2022 [place].  Consent to CCM Services: Ms. Weinmann was given information about Chronic Care Management services including:  CCM service includes personalized support from designated clinical staff supervised by her physician, including individualized plan of care and coordination with other care providers 24/7 contact phone numbers for assistance for urgent and routine care needs. Service will only be billed when office clinical staff spend 20 minutes or more in a month to coordinate care. Only one practitioner may furnish and bill the service in a calendar month. The patient may stop CCM services at any time (effective at the end of the month) by phone call to the office  staff. The patient will be responsible for cost sharing (co-pay) of up to 20% of the service fee (after annual deductible is met).  Patient agreed to services and verbal consent obtained.   Patient verbalizes understanding of instructions provided today and agrees to view in [place].   Telephone follow up appointment with care management team member scheduled for: Peter Garter RN, [facility], CDE Care Management Coordinator [facility] [phone], Mobile [phone]   CLINICAL CARE PLAN: Patient Care Plan: RNCM:Hypertriglyceridemia     Problem Identified: Lack of long term self management of Hypertriglyceridemia   Priority: Medium     Long-Range Goal: Effective self management of Hypertriglyceridemia   Start Date: 02/25/2021  Expected End Date: 07/24/2021  This Visit's Progress: On track  Priority: Medium  Note:   Current Barriers:  Poorly controlled hyperlipidemia/hypertriglyceridemia, complicated by depression, anxiety, vitamin D deficiency  Current antihyperlipidemic regimen: fish oil stopped taking due to side effects Most recent lipid panel:     Component Value Date/Time   CHOL 188 02/03/2021 1356   TRIG 198.0 (H) 02/03/2021 1356   HDL 81.70 02/03/2021 1356   CHOLHDL 2 02/03/2021 1356   VLDL 39.6 02/03/2021 1356   LDLCALC 66 02/03/2021 1356   ASCVD risk enhancing conditions: age >73 Unable to independently  self manage Hypertriglyceridemia States she tried taking the fish oil for a week or two and it made her have GI symptoms that she could not tolerate .  States her GI issues went away when she stopped taking the fish oil.  States she does not eat healthy as she tries to have food that her 38 yr old grandson likes.  States she has not been exercising as she feels she does not have time and her feet and legs ache by the end of the day. RN Care Manager Clinical Goal(s):  patient will work with Consulting civil engineer, providers, and care team towards  execution of optimized self-health management plan patient will verbalize understanding of plan for  self management of Hypertriglyceridemia patient will meet with RN Care Manager to address self management of Hypertriglyceridemia patient will demonstrate improved adherence to prescribed treatment plan for self management of Hypertriglyceridemia patient will verbalize basic understanding of Hypertriglyceridemia disease process and self health management plan the patient will demonstrate ongoing self health care management ability Interventions: Collaboration with Isaac Bliss,  Rayford Halsted, MD regarding development and update of comprehensive plan of care as evidenced by provider attestation and co-signature Inter-disciplinary care team collaboration (see longitudinal plan of care) Medication review performed; medication list updated in electronic medical record.  Inter-disciplinary care team collaboration (see longitudinal plan of care) Evaluation of current treatment plan related to self management of Hypertriglyceridemia and patient's adherence to plan as established by provider. Provided education to patient re: self management of Hypertriglyceridemia Provided patient with written educational materials related to self management of Hypertriglyceridemia Discussed plans with patient for ongoing care management follow up and provided patient with direct contact information for care management team Reviewed importance of exercise to help decrease triglycerides and to help with her stress level Patient Goals/Self-Care Activities:  - change to whole grain breads, cereal, pasta - drink 6 to 8 glasses of water each day - eat fish at least once per week - fill half the plate with nonstarchy vegetables - limit fast food meals to no more than 1 per week - prepare main meal at home 3 to 5 days each week - read food labels for fat, fiber, carbohydrates and portion size - reduce red meat to 2 to 3  times a week - switch to low-fat or skim milk - be open to making changes - eat smaller or less servings of red meat - get blood test (fasting) done 1 week before next visit - increase the amount of fiber in food - read food labels for fat and fiber - switch to low-fat or skim milk Follow Up Plan: Telephone follow up appointment with care management team member scheduled for: 04/07/21 at 10 AM The patient has been provided with contact information for the care management team and has been advised to call with any health related questions or concerns.       Patient Care Plan: Depression/Anxiety     Problem Identified: Lack of long term self management of Depression/Anxiety   Priority: High     Long-Range Goal: Improved self management of Depression/Anxiety   Start Date: 02/25/2021  Expected End Date: 07/24/2021  This Visit's Progress: On track  Priority: High  Note:   Current Barriers:  Ineffective Self Health Maintenance in a patient with Anxiety and Depression Unable to independently self manage of Depression/Anxiety  States she her mood is better since she saw Dr. Jerilee Hoh last month.  States she decided she did not want to have the psychiatry referral yet.  States she is still dealing with a lot of stress with caring for her 30 yr old grandson and her husband who is in the nursing home.  States she divides out her medications at night to help her get back to sleep at night.  States she is a little more awake in the morning.  States she still has foot and leg pain later in the day that the gabapentin sometimes helps Clinical Goal(s):  Collaboration with Isaac Bliss, Rayford Halsted, MD regarding development and update of comprehensive plan of care as evidenced by provider attestation and co-signature Inter-disciplinary care team collaboration (see longitudinal plan of care) patient will work with care management team to address care coordination and chronic disease management needs  related to Educational Needs Care Coordination Psychosocial Support   Interventions:  Evaluation of current treatment plan related to Anxiety and Depression, Limited social support and Mental Health Concerns  self-management and patient's adherence to plan as established by provider. Collaboration with Isaac Bliss, Rayford Halsted, MD regarding development and update of  comprehensive plan of care as evidenced by provider attestation       and co-signature Inter-disciplinary care team collaboration (see longitudinal plan of care) Discussed plans with patient for ongoing care management follow up and provided patient with direct contact information for care management team Reviewed to take medications as prescribed Discussed ways to decrease stress and the importance of taking care of herself in order to be able to be a caregiver. Reviewed that CCM LCSW could provider her with counseling for her depression, anxiety and stress but declines at this time Encouraged to reconsider referral to psychiatry to regulate medications but declines at this time Declines CCM Pharm D referral at this time Self Care Activities:  Self administers medications as prescribed Attends all scheduled provider appointments Calls pharmacy for medication refills Attends church or other social activities Calls provider office for new concerns or questions Patient Goals: - call and visit an old friend - learn and use visualization or guided imagery - practice relaxation or meditation daily - start or continue a personal journal - talk about feelings with a friend, family or spiritual advisor - practice positive thinking and self-talk Follow Up Plan: Telephone follow up appointment with care management team member scheduled for: 04/07/21 at 10 AM The patient has been provided with contact information for the care management team and has been advised to call with any health related questions or concerns.

## 2021-03-04 ENCOUNTER — Other Ambulatory Visit: Payer: Self-pay | Admitting: Internal Medicine

## 2021-03-04 DIAGNOSIS — F411 Generalized anxiety disorder: Secondary | ICD-10-CM

## 2021-03-20 ENCOUNTER — Other Ambulatory Visit: Payer: Self-pay | Admitting: Internal Medicine

## 2021-03-23 ENCOUNTER — Encounter (INDEPENDENT_AMBULATORY_CARE_PROVIDER_SITE_OTHER): Payer: Medicare Other | Admitting: Ophthalmology

## 2021-03-30 ENCOUNTER — Encounter: Payer: Self-pay | Admitting: Family Medicine

## 2021-03-30 ENCOUNTER — Telehealth (INDEPENDENT_AMBULATORY_CARE_PROVIDER_SITE_OTHER): Payer: Medicare Other | Admitting: Family Medicine

## 2021-03-30 ENCOUNTER — Telehealth: Payer: Self-pay | Admitting: *Deleted

## 2021-03-30 ENCOUNTER — Other Ambulatory Visit: Payer: Self-pay

## 2021-03-30 VITALS — Temp 97.0°F

## 2021-03-30 DIAGNOSIS — J029 Acute pharyngitis, unspecified: Secondary | ICD-10-CM | POA: Diagnosis not present

## 2021-03-30 DIAGNOSIS — R059 Cough, unspecified: Secondary | ICD-10-CM | POA: Diagnosis not present

## 2021-03-30 LAB — POCT RAPID STREP A (OFFICE): Rapid Strep A Screen: NEGATIVE

## 2021-03-30 NOTE — Telephone Encounter (Signed)
-----   Message from Caren Macadam, MD sent at 03/30/2021  4:20 PM EDT ----- Strep testing is negative. I thought we had a rapid flu/covid but we only had the send out COVID; so I will be back in touch with these results as soon as I have them.

## 2021-03-30 NOTE — Progress Notes (Signed)
Virtual Visit via Telephone Note  I connected with Elizabeth Ward on 03/30/21 at  1:30 PM EDT by telephone and verified that I am speaking with the correct person using two identifiers.   I discussed the limitations, risks, security and privacy concerns of performing an evaluation and management service by telephone and the availability of in person appointments. I also discussed with the patient that there may be a patient responsible charge related to this service. The patient expressed understanding and agreed to proceed.  Location patient: home Location provider:  [facility]  [place], [place] [address]  Participants present for the call: patient, provider Patient did not have a visit in the prior 7 days to address this/these issue(s).   History of Present Illness: Chief Complaint  Patient presents with   Sore Throat    X3 days, denies Covid test   Cough    Productive with color of sputum unknown x3 days     States that glands under throat that is sore.  feels like a lot of pain; keeping her up at night. Has some cough, congestion. Is coughing up phlegm. Not had fever.   Yolanda Bonine was sick with similar sx and took him 5 days to get over. Negative strep throat and negative covid test for him. Was not covid tested.   Has some aching, loss of appetite.   She didn't take covid shot. And she has not tested for covid at home. No headache. Does have sinus/facial pain sometimes.   Has taken sudafed. Taking ibuprofen '600mg'$  for pain which helps, but has had to take on regular basis. Has had diarrhea as well today. No nausea.   No difficulty with breathing - not short of breath or wheezy.   Voice is a little hoarse. Has had some ear pain; felt like eustachian tube - sharp pain under ear lobe. Has not been dizzy which surprised her because this usually clogs when she is sick.   Not using flonase regularly.    Observations/Objective: Patient sounds hoarse  and is coughing on the phone intermittently, but sounds alert and not short of breath.   Asked patient to come to office just for rapid testing. When seen in person to take rapid strep, covid/flu testing she was alert, coughing but not sick appearing. Back of throat slightly erythematous with cobblestoning. Mild nasal drainage.   Assessment and Plan: 1. Sore throat Rapid strep negative. We did not have rapid testing available so did send off pcr for covid/flu. Called patient on 9/8 to check in. She is feeling worse 9/8 with fatigue. Cough about the same. Throat is sore. No fevers overnight. - COVID-19, Flu A+B and RSV; Future - POC Rapid Strep A - COVID-19, Flu A+B and RSV  2. Cough Patient with hx of bronchitis in remote past. Discussed that suspect this is viral illness and that abx will not be helpful. We discussed using otc treatments (mucinex cold/flu) and tessalon perles for cough today and we will be back in touch once we have testing results. If worsening of symptoms, let us know. I did send in diflucan in case worsening of symptoms and to minimize trips to pharmacy if needed. She is not vaccinated for covid, but has not had known exposure.   Follow Up Instructions: Prn and pending results   [id number] [id number] [id number] 11-20 9443 21-30 I did not refer this patient for an OV in the next 24 hours for this/these issue(s).  I discussed  the assessment and treatment plan with the patient. The patient was provided an opportunity to ask questions and all were answered. The patient agreed with the plan and demonstrated an understanding of the instructions.   The patient was advised to call back or seek an in-person evaluation if the symptoms worsen or if the condition fails to improve as anticipated.  I provided 20 minutes of non-face-to-face time during this encounter.   Micheline Rough, MD

## 2021-03-30 NOTE — Telephone Encounter (Signed)
Patient informed of the message below.

## 2021-03-31 MED ORDER — BENZONATATE 100 MG PO CAPS: 100.0000 mg | ORAL_CAPSULE | Freq: Three times a day (TID) | ORAL | 0 refills | Status: DC | PRN

## 2021-03-31 MED ORDER — DOXYCYCLINE HYCLATE 100 MG PO TABS: 100.0000 mg | ORAL_TABLET | Freq: Two times a day (BID) | ORAL | 0 refills | Status: AC

## 2021-04-02 ENCOUNTER — Telehealth: Payer: Self-pay | Admitting: Internal Medicine

## 2021-04-02 ENCOUNTER — Encounter: Payer: Self-pay | Admitting: Internal Medicine

## 2021-04-02 LAB — COVID-19, FLU A+B AND RSV
Influenza A, NAA: NOT DETECTED
Influenza B, NAA: NOT DETECTED
RSV, NAA: NOT DETECTED
SARS-CoV-2, NAA: DETECTED — AB

## 2021-04-02 MED ORDER — MOLNUPIRAVIR EUA 200MG CAPSULE: 4.0000 | ORAL_CAPSULE | Freq: Two times a day (BID) | ORAL | 0 refills | Status: AC

## 2021-04-02 NOTE — Telephone Encounter (Signed)
Phone on call has pos cvod test  today day 5 of sx .  Glands swollen no fever  but congestion and cough but no fever or sob.   Took one doxy and had gi se so stopped .  Explained  risk benefor of meds will send in molnupiravir   less se  profile.   Can ask pharmacist  further ?s. And fu with pcp or ed if alarm sx

## 2021-04-04 ENCOUNTER — Telehealth: Payer: Self-pay | Admitting: Internal Medicine

## 2021-04-04 NOTE — Telephone Encounter (Signed)
Disregard

## 2021-04-05 ENCOUNTER — Encounter (INDEPENDENT_AMBULATORY_CARE_PROVIDER_SITE_OTHER): Payer: Medicare Other | Admitting: Ophthalmology

## 2021-04-07 ENCOUNTER — Other Ambulatory Visit: Payer: Self-pay | Admitting: Internal Medicine

## 2021-04-07 ENCOUNTER — Telehealth: Payer: Self-pay

## 2021-04-07 ENCOUNTER — Telehealth: Payer: Medicare Other

## 2021-04-07 DIAGNOSIS — F411 Generalized anxiety disorder: Secondary | ICD-10-CM

## 2021-04-07 NOTE — Telephone Encounter (Signed)
  Care Management   Follow Up Note   04/07/2021 Name: Elizabeth Ward MRN: [id number] DOB: 20-Dec-1945   Referred by: Isaac Bliss, Rayford Halsted, MD Reason for referral : Chronic Care Management (RNCM: Follow up Outreach Chronic Care Management and coordination needs-unsuccessful outreach)   An unsuccessful telephone outreach was attempted today. The patient was referred to the case management team for assistance with care management and care coordination.   Follow Up Plan: The care management team will reach out to the patient again over the next 30 days.   Peter Garter RN, Jackquline [place], CDE Care Management Coordinator [facility] [phone], Mobile [phone]

## 2021-04-12 ENCOUNTER — Telehealth: Payer: Self-pay | Admitting: Internal Medicine

## 2021-04-12 NOTE — Telephone Encounter (Signed)
Patient still isn't feeling well. She is wondering if she should get re-tested for COVID.  Patient could be contacted at [phone].  Please advise.

## 2021-04-13 NOTE — Telephone Encounter (Signed)
Spoke with patient and she declined an office visit, but will call back as needed.

## 2021-04-15 ENCOUNTER — Telehealth: Payer: Self-pay | Admitting: *Deleted

## 2021-04-15 NOTE — Chronic Care Management (AMB) (Signed)
  Care Management   Note  04/15/2021 Name: Elizabeth Ward MRN: [id number] DOB: 1945-11-22  Elizabeth Ward is a 75 y.o. year old female who is a primary care patient of Isaac Bliss, Rayford Halsted, MD and is actively engaged with the care management team. I reached out to Orene Desanctis by phone today to assist with re-scheduling a follow up visit with the RN Case Manager  Follow up plan: Unsuccessful telephone outreach attempt made. A HIPAA compliant phone message was left for the patient providing contact information and requesting a return call.  The care management team will reach out to the patient again over the next 7 days.  If patient returns call to provider office, please advise to call [place] at [phone].  [facility] Management  Direct Dial: [phone]

## 2021-04-22 NOTE — Chronic Care Management (AMB) (Signed)
  Care Management   Note  04/22/2021 Name: Elizabeth Ward MRN: [id number] DOB: 1946-06-09  Elizabeth Ward is a 75 y.o. year old female who is a primary care patient of Isaac Bliss, Rayford Halsted, MD and is actively engaged with the care management team. I reached out to Orene Desanctis by phone today to assist with re-scheduling a follow up visit with the RN Case Manager  Follow up plan: A second unsuccessful telephone outreach attempt made. A HIPAA compliant phone message was left for the patient providing contact information and requesting a return call. The care management team will reach out to the patient again over the next 7 days. If patient returns call to provider office, please advise to call [place] at [phone].  [facility] Management  Direct Dial: [phone]

## 2021-04-28 ENCOUNTER — Other Ambulatory Visit: Payer: Self-pay | Admitting: Internal Medicine

## 2021-04-28 DIAGNOSIS — E559 Vitamin D deficiency, unspecified: Secondary | ICD-10-CM

## 2021-04-29 NOTE — Chronic Care Management (AMB) (Signed)
  Care Management   Note  04/29/2021 Name: Elizabeth Ward MRN: [id number] DOB: 07-07-1946  Elizabeth Ward is a 75 y.o. year old female who is a primary care patient of Isaac Bliss, Rayford Halsted, MD and is actively engaged with the care management team. I reached out to Orene Desanctis by phone today to assist with re-scheduling a follow up visit with the RN Case Manager  Follow up plan: Telephone appointment with care management team member scheduled for:05/12/21  [place] Management  Direct Dial: [phone]

## 2021-05-02 ENCOUNTER — Other Ambulatory Visit: Payer: Self-pay | Admitting: Internal Medicine

## 2021-05-02 DIAGNOSIS — F411 Generalized anxiety disorder: Secondary | ICD-10-CM

## 2021-05-09 ENCOUNTER — Other Ambulatory Visit: Payer: Self-pay | Admitting: Internal Medicine

## 2021-05-09 DIAGNOSIS — F411 Generalized anxiety disorder: Secondary | ICD-10-CM

## 2021-05-09 DIAGNOSIS — G6289 Other specified polyneuropathies: Secondary | ICD-10-CM

## 2021-05-10 ENCOUNTER — Telehealth: Payer: Self-pay

## 2021-05-10 NOTE — Telephone Encounter (Signed)
Rx sent 

## 2021-05-10 NOTE — Telephone Encounter (Signed)
Patient called requesting refills for  gabapentin (NEURONTIN) 100 MG capsule ALPRAZolam (XANAX) 0.25 MG tablet

## 2021-05-12 ENCOUNTER — Ambulatory Visit (INDEPENDENT_AMBULATORY_CARE_PROVIDER_SITE_OTHER): Payer: Medicare Other

## 2021-05-12 DIAGNOSIS — F339 Major depressive disorder, recurrent, unspecified: Secondary | ICD-10-CM

## 2021-05-12 DIAGNOSIS — F411 Generalized anxiety disorder: Secondary | ICD-10-CM

## 2021-05-12 DIAGNOSIS — E559 Vitamin D deficiency, unspecified: Secondary | ICD-10-CM

## 2021-05-12 DIAGNOSIS — E781 Pure hyperglyceridemia: Secondary | ICD-10-CM

## 2021-05-12 NOTE — Patient Instructions (Signed)
Visit Information  PATIENT GOALS:  Goals Addressed             This Visit's Progress    RNCM:Manage My Emotions   On track    Timeframe:  Long-Range Goal Priority:  High Start Date:    02/25/21                         Expected End Date:     07/24/21                  Follow Up Date 08/11/21    - call and visit an old friend - learn and use visualization or guided imagery - practice relaxation or meditation daily - start or continue a personal journal - talk about feelings with a friend, family or spiritual advisor - practice positive thinking and self-talk -consider speaking with CCM LCSW   Why is this important?   When you are stressed, down or upset, your body reacts too.  For example, your blood pressure may get higher; you may have a headache or stomachache.  When your emotions get the best of you, your body's ability to fight off cold and flu gets weak.  These steps will help you manage your emotions.     Notes:      RNCM:Manage My Triglycerides   On track    Timeframe:  Long-Range Goal Priority:  Medium Start Date:    02/25/21                         Expected End Date:    07/24/21                   Follow Up Date 08/11/21   - change to whole grain breads, cereal, pasta - eat smaller or less servings of red meat - fill half the plate with nonstarchy vegetables - get blood test (fasting) done 1 week before next visit - increase the amount of fiber in food - read food labels for fat and fiber - switch to low-fat or skim milk    Why is this important?   Changing cholesterol starts with eating heart-healthy foods.  Other steps may be to increase your activity and to quit if you smoke.    Notes:         Patient verbalizes understanding of instructions provided today and agrees to view in [place].   Telephone follow up appointment with care management team member scheduled for: 08/11/21 Peter Garter RN, [facility], CDE Care Management Coordinator [place] [phone], Mobile [phone]

## 2021-05-12 NOTE — Chronic Care Management (AMB) (Signed)
Chronic Care Management   CCM RN Visit Note  05/12/2021 Name: Elizabeth Ward MRN: [id number] DOB: Sep 27, 1945  Subjective: Elizabeth Ward is a 75 y.o. year old female who is a primary care patient of Elizabeth Ward, Elizabeth Halsted, MD. The care management team was consulted for assistance with disease management and care coordination needs.    Engaged with patient by telephone for follow up visit in response to provider referral for case management and/or care coordination services.   Consent to Services:  The patient was given information about Chronic Care Management services, agreed to services, and gave verbal consent prior to initiation of services.  Please see initial visit note for detailed documentation.   Patient agreed to services and verbal consent obtained.   Assessment: Review of patient past medical history, allergies, medications, health status, including review of consultants reports, laboratory and other test data, was performed as part of comprehensive evaluation and provision of chronic care management services.   SDOH (Social Determinants of Health) assessments and interventions performed:    CCM Care Plan  Allergies  Allergen Reactions   Azithromycin Nausea And Vomiting   Prochlorperazine Edisylate Other (See Comments)    Tongue spasms(tongue would droop back in to the throat    Tape Rash    Outpatient Encounter Medications as of 05/12/2021  Medication Sig   ALPRAZolam (XANAX) 0.25 MG tablet TAKE 1 TABLET BY MOUTH FOUR TIMES A DAY   B Complex Vitamins (B COMPLEX PO) Take by mouth daily.   benzonatate (TESSALON PERLES) 100 MG capsule Take 1 capsule (100 mg total) by mouth 3 (three) times daily as needed for cough.   estradiol (ESTRACE) 0.1 MG/GM vaginal cream Place 1 Applicatorful vaginally 2 (two) times a week.   estradiol (ESTRACE) 1 MG tablet Take 1 tablet (1 mg total) by mouth daily.   fluticasone (FLONASE) 50 MCG/ACT nasal spray Place 2 sprays into both  nostrils daily.   gabapentin (NEURONTIN) 100 MG capsule TAKE 1 CAPSULE BY MOUTH TWICE A DAY   ibuprofen (ADVIL,MOTRIN) 600 MG tablet TAKE ONE TABLET BY MOUTH EVERY 6-8 HOURS AS NEEDED FOR PAIN   traZODone (DESYREL) 50 MG tablet TAKE 0.5 TABLETS (25 MG TOTAL) BY MOUTH AT BEDTIME AS NEEDED.   No facility-administered encounter medications on file as of 05/12/2021.    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   Vitreous floaters, left 02/19/2020   Pseudophakia 02/19/2020   Vitamin D deficiency 12/19/2019   Lamellar macular hole of right eye 02/14/2016   Macular puckering, left eye 02/14/2016   Nuclear sclerotic cataract of both eyes 02/14/2016   Vitreous floaters of both eyes 02/14/2016   IRRITABLE BOWEL SYNDROME 09/07/2009   INSOMNIA 09/07/2009   Anxiety state 08/14/2008   MENOPAUSE, SURGICAL 08/14/2008    Conditions to be addressed/monitored:Hypertriglyceridemia, Anxiety, and Depression  Care Plan : RNCM:Hypertriglyceridemia  Updates made by Dimitri Ped, RN since 05/12/2021 12:00 AM     Problem: Lack of long term self management of Hypertriglyceridemia   Priority: Medium     Long-Range Goal: Effective self management of Hypertriglyceridemia   Start Date: 02/25/2021  Expected End Date: 07/24/2021  This Visit's Progress: On track  Recent Progress: On track  Priority: Medium  Note:   Current Barriers:  Poorly controlled hyperlipidemia/hypertriglyceridemia, complicated by depression, anxiety, vitamin D deficiency  Current antihyperlipidemic regimen: fish oil  stopped taking due to side effects Most recent lipid panel:     Component Value Date/Time   CHOL 188 02/03/2021 1356   TRIG 198.0 (H) 02/03/2021 1356   HDL 81.70 02/03/2021 1356   CHOLHDL 2 02/03/2021 1356   VLDL 39.6 02/03/2021 1356   LDLCALC 66  02/03/2021 1356   ASCVD risk enhancing conditions: age >36 Unable to independently  self manage Hypertriglyceridemia  States she still does not eat healthy as she tries to have food that her 5 yr old grandson likes.  States she has not been exercising as she feels she does not have time and her feet and legs ache by the end of the day. States her stress is much better now. RN Care Manager Clinical Goal(s):  patient will work with Consulting civil engineer, providers, and care team towards execution of optimized self-health management plan patient will verbalize understanding of plan for  self management of Hypertriglyceridemia patient will meet with RN Care Manager to address self management of Hypertriglyceridemia patient will demonstrate improved adherence to prescribed treatment plan for self management of Hypertriglyceridemia patient will verbalize basic understanding of Hypertriglyceridemia disease process and self health management plan the patient will demonstrate ongoing self health care management ability Interventions: Collaboration with Elizabeth Ward, Elizabeth Halsted, MD regarding development and update of comprehensive plan of care as evidenced by provider attestation and co-signature Inter-disciplinary care team collaboration (see longitudinal plan of care) Medication review performed; medication list updated in electronic medical record.  Inter-disciplinary care team collaboration (see longitudinal plan of care) Evaluation of current treatment plan related to self management of Hypertriglyceridemia and patient's adherence to plan as established by provider. Provided education to patient re: self management of Hypertriglyceridemia reinforced education to try to eat healthier Discussed plans with patient for ongoing care management follow up and provided patient with direct contact information for care management team Reinforced importance of exercise to help decrease triglycerides and to help  with her stress level Patient Goals/Self-Care Activities:  - change to whole grain breads, cereal, pasta - drink 6 to 8 glasses of water each day - eat fish at least once per week - fill half the plate with nonstarchy vegetables - limit fast food meals to no more than 1 per week - prepare main meal at home 3 to 5 days each week - read food labels for fat, fiber, carbohydrates and portion size - reduce red meat to 2 to 3 times a week - switch to low-fat or skim milk - be open to making changes - eat smaller or less servings of red meat - get blood test (fasting) done 1 week before next visit - increase the amount of fiber in food - read food labels for fat and fiber - switch to low-fat or skim milk Follow Up Plan: Telephone follow up appointment with care management team member scheduled for: 08/11/21 at 10 AM The patient has been provided with contact information for the care management team and has been advised to call with any health related questions or concerns.       Care Plan : Depression/Anxiety  Updates made by Dimitri Ped, RN since 05/12/2021 12:00 AM     Problem: Lack of long term self management of Depression/Anxiety   Priority: High     Long-Range Goal: Improved self management of Depression/Anxiety   Start Date: 02/25/2021  Expected End Date: 07/24/2021  This Visit's Progress: On track  Recent Progress: On track  Priority: High  Note:  Current Barriers:  Ineffective Self Health Maintenance in a patient with Anxiety and Depression Unable to independently self manage of Depression/Anxiety  States she her mood and stress are much better now  States she does not need to talk to the LCSW or anyone else.  States she is coping with caring for her 62 yr old grandson and her husband who is in the nursing home.  States she divides out her medications at night to help her get back to sleep at night.  States she is a little more awake in the morning.  States she still has  foot and leg pain later in the day that the gabapentin sometimes helps.  States she wants to go to see the foot doctor to see if she needs inserts in her shoes  Clinical Goal(s):  Collaboration with Elizabeth Ward, Elizabeth Halsted, MD regarding development and update of comprehensive plan of care as evidenced by provider attestation and co-signature Inter-disciplinary care team collaboration (see longitudinal plan of care) patient will work with care management team to address care coordination and chronic disease management needs related to Educational Needs Care Coordination Psychosocial Support   Interventions:  Evaluation of current treatment plan related to Anxiety and Depression, Limited social support and Mental Health Concerns  self-management and patient's adherence to plan as established by provider. Collaboration with Elizabeth Ward, Elizabeth Halsted, MD regarding development and update of comprehensive plan of care as evidenced by provider attestation       and co-signature Inter-disciplinary care team collaboration (see longitudinal plan of care) Discussed plans with patient for ongoing care management follow up and provided patient with direct contact information for care management team Reviewed to take medications as prescribed Reinforced ways to decrease stress and the importance of taking care of herself in order to be able to be a caregiver. Reinforced that CCM LCSW could provider her with counseling for her depression, anxiety and stress but declines at this time Self Care Activities:  Self administers medications as prescribed Attends all scheduled provider appointments Calls pharmacy for medication refills Attends church or other social activities Calls provider office for new concerns or questions Patient Goals: - call and visit an old friend - learn and use visualization or guided imagery - practice relaxation or meditation daily - start or continue a personal journal - talk  about feelings with a friend, family or spiritual advisor - practice positive thinking and self-talk Follow Up Plan: Telephone follow up appointment with care management team member scheduled for: 08/11/21 at 10 AM The patient has been provided with contact information for the care management team and has been advised to call with any health related questions or concerns.       Plan:Telephone follow up appointment with care management team member scheduled for:  08/11/21 The patient has been provided with contact information for the care management team and has been advised to call with any health related questions or concerns.  Peter Garter RN, Jackquline Denmark, CDE Care Management Coordinator [facility] [phone], Mobile [phone]

## 2021-05-23 DIAGNOSIS — E781 Pure hyperglyceridemia: Secondary | ICD-10-CM | POA: Diagnosis not present

## 2021-05-23 DIAGNOSIS — F339 Major depressive disorder, recurrent, unspecified: Secondary | ICD-10-CM | POA: Diagnosis not present

## 2021-06-08 ENCOUNTER — Other Ambulatory Visit: Payer: Self-pay | Admitting: Internal Medicine

## 2021-06-08 DIAGNOSIS — F411 Generalized anxiety disorder: Secondary | ICD-10-CM

## 2021-07-06 ENCOUNTER — Other Ambulatory Visit: Payer: Self-pay | Admitting: Internal Medicine

## 2021-07-06 DIAGNOSIS — F411 Generalized anxiety disorder: Secondary | ICD-10-CM

## 2021-07-08 ENCOUNTER — Telehealth: Payer: Self-pay | Admitting: Internal Medicine

## 2021-07-08 NOTE — Telephone Encounter (Addendum)
Pt is calling and needs a refill . Pt is aware alprazolam has been sent to her pharm on 07-06-2021

## 2021-08-05 ENCOUNTER — Other Ambulatory Visit: Payer: Self-pay | Admitting: Internal Medicine

## 2021-08-05 DIAGNOSIS — F411 Generalized anxiety disorder: Secondary | ICD-10-CM

## 2021-08-05 NOTE — Telephone Encounter (Signed)
Patient called because she needs refills on ALPRAZolam (XANAX) 0.25 MG tablet    Please send to  [id number] - [place], [place] Phone:  [phone]  Fax:  [phone]        Please advise

## 2021-08-09 MED ORDER — ALPRAZOLAM 0.25 MG PO TABS: 0.2500 mg | ORAL_TABLET | Freq: Four times a day (QID) | ORAL | 0 refills | Status: DC

## 2021-08-09 NOTE — Telephone Encounter (Signed)
Patient is calling back regarding the medication refill request for ALPRAZolam (XANAX) 0.25 MG tablet.  Please advise.

## 2021-08-09 NOTE — Telephone Encounter (Signed)
Patient aware refill was sent in

## 2021-08-11 ENCOUNTER — Ambulatory Visit (INDEPENDENT_AMBULATORY_CARE_PROVIDER_SITE_OTHER): Payer: Medicare Other

## 2021-08-11 DIAGNOSIS — E781 Pure hyperglyceridemia: Secondary | ICD-10-CM

## 2021-08-11 DIAGNOSIS — F339 Major depressive disorder, recurrent, unspecified: Secondary | ICD-10-CM

## 2021-08-11 DIAGNOSIS — F411 Generalized anxiety disorder: Secondary | ICD-10-CM

## 2021-08-11 NOTE — Chronic Care Management (AMB) (Signed)
Chronic Care Management   CCM RN Visit Note  08/11/2021 Name: Elizabeth Ward MRN: [id number] DOB: 01/28/1946  Subjective: Elizabeth Ward is a 76 y.o. year old female who is a primary care patient of Isaac Bliss, Rayford Halsted, MD. The care management team was consulted for assistance with disease management and care coordination needs.    Engaged with patient by telephone for follow up visit in response to provider referral for case management and/or care coordination services.   Consent to Services:  The patient was given information about Chronic Care Management services, agreed to services, and gave verbal consent prior to initiation of services.  Please see initial visit note for detailed documentation.   Patient agreed to services and verbal consent obtained.   Assessment: Review of patient past medical history, allergies, medications, health status, including review of consultants reports, laboratory and other test data, was performed as part of comprehensive evaluation and provision of chronic care management services.   SDOH (Social Determinants of Health) assessments and interventions performed:    CCM Care Plan  Allergies  Allergen Reactions   Azithromycin Nausea And Vomiting   Prochlorperazine Edisylate Other (See Comments)    Tongue spasms(tongue would droop back in to the throat    Tape Rash    Outpatient Encounter Medications as of 08/11/2021  Medication Sig   ALPRAZolam (XANAX) 0.25 MG tablet Take 1 tablet (0.25 mg total) by mouth 4 (four) times daily.   B Complex Vitamins (B COMPLEX PO) Take by mouth daily.   benzonatate (TESSALON PERLES) 100 MG capsule Take 1 capsule (100 mg total) by mouth 3 (three) times daily as needed for cough.   estradiol (ESTRACE) 0.1 MG/GM vaginal cream Place 1 Applicatorful vaginally 2 (two) times a week.   estradiol (ESTRACE) 1 MG tablet Take 1 tablet (1 mg total) by mouth daily.   fluticasone (FLONASE) 50 MCG/ACT nasal spray Place 2  sprays into both nostrils daily.   gabapentin (NEURONTIN) 100 MG capsule TAKE 1 CAPSULE BY MOUTH TWICE A DAY   ibuprofen (ADVIL,MOTRIN) 600 MG tablet TAKE ONE TABLET BY MOUTH EVERY 6-8 HOURS AS NEEDED FOR PAIN   traZODone (DESYREL) 50 MG tablet TAKE 0.5 TABLETS (25 MG TOTAL) BY MOUTH AT BEDTIME AS NEEDED.   No facility-administered encounter medications on file as of 08/11/2021.    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   Vitreous floaters, left 02/19/2020   Pseudophakia 02/19/2020   Vitamin D deficiency 12/19/2019   Lamellar macular hole of right eye 02/14/2016   Macular puckering, left eye 02/14/2016   Nuclear sclerotic cataract of both eyes 02/14/2016   Vitreous floaters of both eyes 02/14/2016   IRRITABLE BOWEL SYNDROME 09/07/2009   INSOMNIA 09/07/2009   Anxiety state 08/14/2008   MENOPAUSE, SURGICAL 08/14/2008    Conditions to be addressed/monitored:Hypertriglyceridemia, Anxiety, and Depression  Care Plan : RNCM:Hypertriglyceridemia  Updates made by Dimitri Ped, RN since 08/11/2021 12:00 AM     Problem: Lack of long term self management of Hypertriglyceridemia   Priority: Medium     Long-Range Goal: Effective self management of Hypertriglyceridemia   Start Date: 02/25/2021  Expected End Date: 07/24/2021  Recent Progress: On track  Priority: Medium  Note:   Current Barriers:  Poorly controlled hyperlipidemia/hypertriglyceridemia, complicated by depression, anxiety, vitamin D deficiency  Current antihyperlipidemic regimen: fish oil stopped taking due  to side effects Most recent lipid panel:     Component Value Date/Time   CHOL 188 02/03/2021 1356   TRIG 198.0 (H) 02/03/2021 1356   HDL 81.70 02/03/2021 1356   CHOLHDL 2 02/03/2021 1356   VLDL 39.6 02/03/2021 1356   LDLCALC 66 02/03/2021 1356   ASCVD  risk enhancing conditions: age >79 Unable to independently  self manage Hypertriglyceridemia  States she still does not eat healthy as she tries to have food that her 50 yr old grandson likes.  States she has not been exercising as she feels she does not have time and her feet and legs ache by the end of the day. States her stress is much better now. RN Care Manager Clinical Goal(s):  patient will work with Consulting civil engineer, providers, and care team towards execution of optimized self-health management plan patient will verbalize understanding of plan for  self management of Hypertriglyceridemia patient will meet with RN Care Manager to address self management of Hypertriglyceridemia patient will demonstrate improved adherence to prescribed treatment plan for self management of Hypertriglyceridemia patient will verbalize basic understanding of Hypertriglyceridemia disease process and self health management plan the patient will demonstrate ongoing self health care management ability Interventions: Collaboration with Isaac Bliss, Rayford Halsted, MD regarding development and update of comprehensive plan of care as evidenced by provider attestation and co-signature Inter-disciplinary care team collaboration (see longitudinal plan of care) Medication review performed; medication list updated in electronic medical record.  Inter-disciplinary care team collaboration (see longitudinal plan of care) Evaluation of current treatment plan related to self management of Hypertriglyceridemia and patient's adherence to plan as established by provider. Provided education to patient re: self management of Hypertriglyceridemia reinforced education to try to eat healthier Discussed plans with patient for ongoing care management follow up and provided patient with direct contact information for care management team Reinforced importance of exercise to help decrease triglycerides and to help with her stress  level Patient Goals/Self-Care Activities:  - change to whole grain breads, cereal, pasta - drink 6 to 8 glasses of water each day - eat fish at least once per week - fill half the plate with nonstarchy vegetables - limit fast food meals to no more than 1 per week - prepare main meal at home 3 to 5 days each week - read food labels for fat, fiber, carbohydrates and portion size - reduce red meat to 2 to 3 times a week - switch to low-fat or skim milk - be open to making changes - eat smaller or less servings of red meat - get blood test (fasting) done 1 week before next visit - increase the amount of fiber in food - read food labels for fat and fiber - switch to low-fat or skim milk Follow Up Plan: Telephone follow up appointment with care management team member scheduled for: 08/11/21 at 10 AM The patient has been provided with contact information for the care management team and has been advised to call with any health related questions or concerns.       Care Plan : Depression/Anxiety  Updates made by Dimitri Ped, RN since 08/11/2021 12:00 AM     Problem: Lack of long term self management of Depression/Anxiety   Priority: High     Long-Range Goal: Improved self management of Depression/Anxiety   Start Date: 02/25/2021  Expected End Date: 07/24/2021  Recent Progress: On track  Priority: High  Note:   Current Barriers:  Ineffective Self Health Maintenance in a  patient with Anxiety and Depression Unable to independently self manage of Depression/Anxiety  States she her mood and stress are much better now  States she does not need to talk to the LCSW or anyone else.  States she is coping with caring for her 52 yr old grandson and her husband who is in the nursing home.  States she divides out her medications at night to help her get back to sleep at night.  States she is a little more awake in the morning.  States she still has foot and leg pain later in the day that the  gabapentin sometimes helps.  States she wants to go to see the foot doctor to see if she needs inserts in her shoes  Clinical Goal(s):  Collaboration with Isaac Bliss, Rayford Halsted, MD regarding development and update of comprehensive plan of care as evidenced by provider attestation and co-signature Inter-disciplinary care team collaboration (see longitudinal plan of care) patient will work with care management team to address care coordination and chronic disease management needs related to Educational Needs Care Coordination Psychosocial Support   Interventions:  Evaluation of current treatment plan related to Anxiety and Depression, Limited social support and Mental Health Concerns  self-management and patient's adherence to plan as established by provider. Collaboration with Isaac Bliss, Rayford Halsted, MD regarding development and update of comprehensive plan of care as evidenced by provider attestation       and co-signature Inter-disciplinary care team collaboration (see longitudinal plan of care) Discussed plans with patient for ongoing care management follow up and provided patient with direct contact information for care management team Reviewed to take medications as prescribed Reinforced ways to decrease stress and the importance of taking care of herself in order to be able to be a caregiver. Reinforced that CCM LCSW could provider her with counseling for her depression, anxiety and stress but declines at this time Self Care Activities:  Self administers medications as prescribed Attends all scheduled provider appointments Calls pharmacy for medication refills Attends church or other social activities Calls provider office for new concerns or questions Patient Goals: - call and visit an old friend - learn and use visualization or guided imagery - practice relaxation or meditation daily - start or continue a personal journal - talk about feelings with a friend, family or  spiritual advisor - practice positive thinking and self-talk Follow Up Plan: Telephone follow up appointment with care management team member scheduled for: 08/11/21 at 10 AM The patient has been provided with contact information for the care management team and has been advised to call with any health related questions or concerns.      Care Plan : RN Care Manager Plan of Care  Updates made by Dimitri Ped, RN since 08/11/2021 12:00 AM     Problem: Chronic Disease Management and Care Coordination Needs (Hypertriglyceridemia, depression/anxiety)   Priority: High     Long-Range Goal: Establish Plan of Care for Chronic Disease Management Needs (Hypertriglyceridemia, depression/anxiety)   Start Date: 08/11/2021  Expected End Date: 08/08/2022  Priority: High  Note:   Current Barriers:  Chronic Disease Management support and education needs related to Hypertriglyceridemia, Anxiety, and Depression  States she her mood and stress are much better now  She continues to decline to talk to the LCSW or anyone else.  States she is coping with caring for her 79 yr old grandson and her husband who is in the nursing home.  States she divides out her medications at night  to help her get back to sleep at night.  States she is a little more awake in the morning. States she still does not eat healthy as she tries to have food that her 49 yr old grandson likes.  States she has not been exercising as she feels she does not have time and her feet and legs ache by the end of the day.   RNCM Clinical Goal(s):  Patient will verbalize understanding of plan for management of Hypertriglyceridemia, Anxiety, and Depression as evidenced by voiced adherence to plan of care verbalize basic understanding of  Hypertriglyceridemia, Anxiety, and Depression disease process and self health management plan as evidenced by voiced understanding and teach back take all medications exactly as prescribed and will call provider for  medication related questions as evidenced by dispense report and pt verbalization attend all scheduled medical appointments: no scheduled provider  as evidenced by medical records demonstrate Ongoing adherence to prescribed treatment plan for Hypertriglyceridemia, Anxiety, and Depression as evidenced by readings within limits, voiced adherence to plan of care continue to work with RN Care Manager to address care management and care coordination needs related to  Hypertriglyceridemia, Anxiety, and Depression as evidenced by adherence to CM Team Scheduled appointments through collaboration with RN Care manager, provider, and care team.   Interventions: 1:1 collaboration with primary care provider regarding development and update of comprehensive plan of care as evidenced by provider attestation and co-signature Inter-disciplinary care team collaboration (see longitudinal plan of care) Evaluation of current treatment plan related to  self management and patient's adherence to plan as established by provider   Depression/anxiety  (Status:  Goal on track:  Yes.)  Long Term Goal Evaluation of current treatment plan related to Anxiety and Depression, Limited social support and caregiver stress  self-management and patient's adherence to plan as established by provider. Discussed plans with patient for ongoing care management follow up and provided patient with direct contact information for care management team Evaluation of current treatment plan related to depression/anxiety and patient's adherence to plan as established by provider Social Work referral for continues to decline social work referral Screening for signs and symptoms of depression related to chronic disease state  Reviewed importance of taking time for herself even 10-15 minutes to rest and relax  Hyperlipidemia Interventions:  (Status:  Goal on track:  Yes.) Long Term Goal Medication review performed; medication list updated in  electronic medical record.  Provider established cholesterol goals reviewed Counseled on importance of regular laboratory monitoring as prescribed Reviewed role and benefits of statin for ASCVD risk reduction Reviewed importance of limiting foods high in cholesterol Reviewed exercise goals and target of 150 minutes per week  Patient Goals/Self-Care Activities: Take all medications as prescribed Attend all scheduled provider appointments Call pharmacy for medication refills 3-7 days in advance of running out of medications Perform all self care activities independently  Call provider office for new concerns or questions  call for medicine refill 2 or 3 days before it runs out take all medications exactly as prescribed call doctor with any symptoms you believe are related to your medicine develop an exercise routine  Follow Up Plan:  Telephone follow up appointment with care management team member scheduled for:  10/13/21 The patient has been provided with contact information for the care management team and has been advised to call with any health related questions or concerns.       Plan:Telephone follow up appointment with care management team member scheduled for:  10/13/21  The patient has been provided with contact information for the care management team and has been advised to call with any health related questions or concerns.  Peter Garter RN, Jackquline Denmark, CDE Care Management Coordinator [facility] [phone], Mobile [phone]

## 2021-08-11 NOTE — Patient Instructions (Signed)
Visit Information  Thank you for taking time to visit with me today. Please don't hesitate to contact me if I can be of assistance to you before our next scheduled telephone appointment.  Following are the goals we discussed today:  Take all medications as prescribed Attend all scheduled provider appointments Call pharmacy for medication refills 3-7 days in advance of running out of medications Perform all self care activities independently  Call provider office for new concerns or questions  call for medicine refill 2 or 3 days before it runs out take all medications exactly as prescribed call doctor with any symptoms you believe are related to your medicine develop an exercise routine Our next appointment is by telephone on 10/13/21 at 10 AM  Please call the care guide team at [phone] if you need to cancel or reschedule your appointment.   If you are experiencing a Mental Health or [place] or need someone to talk to, please call the Suicide and Crisis Lifeline: 988 call the [place] National Suicide Prevention Lifeline: 315-646-7262 or TTY: 915 113 7363 TTY 705-152-6950) to talk to a trained counselor call 1-800-273-TALK (toll free, 24 hour hotline) go to [facility] [address] [place] [phone]) call 911   Patient verbalizes understanding of instructions and care plan provided today and agrees to view in [place]. Active MyChart status confirmed with patient.   Peter Garter RN, Jackquline Denmark, CDE Care Management Coordinator [facility] [phone], Mobile [phone]

## 2021-08-23 DIAGNOSIS — F32A Depression, unspecified: Secondary | ICD-10-CM | POA: Diagnosis not present

## 2021-08-23 DIAGNOSIS — E781 Pure hyperglyceridemia: Secondary | ICD-10-CM | POA: Diagnosis not present

## 2021-09-05 ENCOUNTER — Other Ambulatory Visit: Payer: Self-pay | Admitting: Internal Medicine

## 2021-09-05 DIAGNOSIS — F411 Generalized anxiety disorder: Secondary | ICD-10-CM

## 2021-09-15 ENCOUNTER — Other Ambulatory Visit: Payer: Self-pay | Admitting: Internal Medicine

## 2021-10-08 ENCOUNTER — Other Ambulatory Visit: Payer: Self-pay | Admitting: Internal Medicine

## 2021-10-08 DIAGNOSIS — F411 Generalized anxiety disorder: Secondary | ICD-10-CM

## 2021-10-10 ENCOUNTER — Other Ambulatory Visit: Payer: Self-pay | Admitting: Internal Medicine

## 2021-10-10 DIAGNOSIS — F411 Generalized anxiety disorder: Secondary | ICD-10-CM

## 2021-10-10 MED ORDER — ALPRAZOLAM 0.25 MG PO TABS: 0.2500 mg | ORAL_TABLET | Freq: Four times a day (QID) | ORAL | 0 refills | Status: DC

## 2021-10-10 NOTE — Telephone Encounter (Signed)
Pt is calling checking on the status of alprazolam refill  ?[id number] [place] [facility] [address]  [phone] ?Fax:  [phone] ?  ? ?

## 2021-10-10 NOTE — Telephone Encounter (Signed)
Pt call and stated she need her refill today because she is out she stated she take 4 pill and only have two. ?

## 2021-10-13 ENCOUNTER — Telehealth: Payer: Medicare Other

## 2021-11-04 ENCOUNTER — Other Ambulatory Visit: Payer: Self-pay | Admitting: Internal Medicine

## 2021-11-04 DIAGNOSIS — F411 Generalized anxiety disorder: Secondary | ICD-10-CM

## 2021-11-09 ENCOUNTER — Telehealth: Payer: Self-pay | Admitting: Internal Medicine

## 2021-11-09 ENCOUNTER — Other Ambulatory Visit: Payer: Self-pay | Admitting: Internal Medicine

## 2021-11-09 DIAGNOSIS — F411 Generalized anxiety disorder: Secondary | ICD-10-CM

## 2021-11-09 NOTE — Telephone Encounter (Signed)
Last refill 10/10/20 ?Last office with with Dr Jerilee Hoh 02/03/21 ?

## 2021-11-09 NOTE — Telephone Encounter (Signed)
Patient called because she needs a refill on ALPRAZolam (XANAX) 0.25 MG tablet ? ?Patient is almost out and only has today and some for tomorrow  ? ?Please send to  ? ?[id number] [place] [facility] [address] [place]:  [phone] ?Fax:  [phone] ?  ? ? ? ? ?Please advise  ?

## 2021-11-19 ENCOUNTER — Other Ambulatory Visit: Payer: Self-pay | Admitting: Internal Medicine

## 2021-11-19 DIAGNOSIS — G6289 Other specified polyneuropathies: Secondary | ICD-10-CM

## 2021-12-08 ENCOUNTER — Other Ambulatory Visit: Payer: Self-pay | Admitting: Internal Medicine

## 2021-12-08 DIAGNOSIS — F411 Generalized anxiety disorder: Secondary | ICD-10-CM

## 2021-12-12 ENCOUNTER — Telehealth: Payer: Self-pay | Admitting: Internal Medicine

## 2021-12-12 ENCOUNTER — Other Ambulatory Visit: Payer: Self-pay

## 2021-12-12 DIAGNOSIS — F411 Generalized anxiety disorder: Secondary | ICD-10-CM

## 2021-12-12 MED ORDER — ALPRAZOLAM 0.25 MG PO TABS: 0.2500 mg | ORAL_TABLET | Freq: Four times a day (QID) | ORAL | 0 refills | Status: DC

## 2021-12-12 NOTE — Telephone Encounter (Signed)
[facility] is [facility] and [facility] not [facility] in [facility]. Pt will new rx ALPRAZolam (XANAX) 0.25 MG tablet #120 for a month supply  [place] [place] Phone:  [phone]  Fax:  [phone]

## 2021-12-12 NOTE — Telephone Encounter (Signed)
[facility]. Spoke with [place].

## 2021-12-20 ENCOUNTER — Other Ambulatory Visit: Payer: Self-pay | Admitting: Internal Medicine

## 2021-12-20 DIAGNOSIS — F411 Generalized anxiety disorder: Secondary | ICD-10-CM

## 2022-01-09 ENCOUNTER — Other Ambulatory Visit: Payer: Self-pay | Admitting: Internal Medicine

## 2022-01-09 ENCOUNTER — Telehealth: Payer: Self-pay | Admitting: Internal Medicine

## 2022-01-09 DIAGNOSIS — F411 Generalized anxiety disorder: Secondary | ICD-10-CM

## 2022-01-09 NOTE — Telephone Encounter (Signed)
Pt called very upset stating she is still waiting for her ALPRAZolam (XANAX) 0.25 MG tablet and she has been calling for over 2 wks and she only has 7 pills left and she takes 4 a day. She wants someone to call her back ASAP.  [facility]: [id number] [place] [facility] [address]:  [phone] Fax:  [phone]

## 2022-01-10 MED ORDER — ALPRAZOLAM 0.25 MG PO TABS: 0.2500 mg | ORAL_TABLET | Freq: Four times a day (QID) | ORAL | 0 refills | Status: DC

## 2022-01-13 ENCOUNTER — Ambulatory Visit: Payer: Self-pay

## 2022-01-13 ENCOUNTER — Other Ambulatory Visit: Payer: Self-pay | Admitting: Internal Medicine

## 2022-01-13 DIAGNOSIS — E781 Pure hyperglyceridemia: Secondary | ICD-10-CM

## 2022-01-13 DIAGNOSIS — F411 Generalized anxiety disorder: Secondary | ICD-10-CM

## 2022-02-03 ENCOUNTER — Other Ambulatory Visit: Payer: Self-pay | Admitting: Adult Health

## 2022-02-03 DIAGNOSIS — F411 Generalized anxiety disorder: Secondary | ICD-10-CM

## 2022-02-03 NOTE — Telephone Encounter (Signed)
Pt is due for cpe. Need to schedule appt. For further refills

## 2022-02-06 ENCOUNTER — Other Ambulatory Visit: Payer: Self-pay | Admitting: Adult Health

## 2022-02-06 DIAGNOSIS — F411 Generalized anxiety disorder: Secondary | ICD-10-CM

## 2022-02-07 NOTE — Telephone Encounter (Signed)
Pt is calling and she has cpe sch for [phone] and would like  [id number] [place] - [address]:  [phone] Fax:  [phone]

## 2022-02-07 NOTE — Addendum Note (Signed)
Addended by: Gwenyth Ober R on: 02/07/2022 02:56 PM   Modules accepted: Orders

## 2022-02-08 NOTE — Telephone Encounter (Signed)
Message routed to PCP CMA   Pt stated she has a CPE in 04/2022 and would like this medication as soon as it can get filled. Pt stated she has one tab left and requested refill a week ago but she is not sure what happened.

## 2022-02-08 NOTE — Telephone Encounter (Signed)
Refill sent.

## 2022-02-08 NOTE — Telephone Encounter (Signed)
Pt is calling again to request the refill but would like to know if she can have it refilled before her scheduled CPE?  Please advise.

## 2022-02-08 NOTE — Telephone Encounter (Signed)
Last fill 01/10/22

## 2022-03-02 DIAGNOSIS — H02831 Dermatochalasis of right upper eyelid: Secondary | ICD-10-CM | POA: Diagnosis not present

## 2022-03-02 DIAGNOSIS — H2512 Age-related nuclear cataract, left eye: Secondary | ICD-10-CM | POA: Diagnosis not present

## 2022-03-02 DIAGNOSIS — Z961 Presence of intraocular lens: Secondary | ICD-10-CM | POA: Diagnosis not present

## 2022-03-02 DIAGNOSIS — H0102B Squamous blepharitis left eye, upper and lower eyelids: Secondary | ICD-10-CM | POA: Diagnosis not present

## 2022-03-02 DIAGNOSIS — Z9889 Other specified postprocedural states: Secondary | ICD-10-CM | POA: Diagnosis not present

## 2022-03-02 DIAGNOSIS — H02834 Dermatochalasis of left upper eyelid: Secondary | ICD-10-CM | POA: Diagnosis not present

## 2022-03-02 DIAGNOSIS — H0102A Squamous blepharitis right eye, upper and lower eyelids: Secondary | ICD-10-CM | POA: Diagnosis not present

## 2022-03-02 DIAGNOSIS — H34231 Retinal artery branch occlusion, right eye: Secondary | ICD-10-CM | POA: Diagnosis not present

## 2022-03-06 ENCOUNTER — Other Ambulatory Visit: Payer: Self-pay | Admitting: Internal Medicine

## 2022-03-06 NOTE — Telephone Encounter (Signed)
Refill request traZODone (DESYREL) 50 MG tablet pt requests call to confirm. Pt states insurance will only cover 45 tabs, not 30

## 2022-03-07 MED ORDER — TRAZODONE HCL 50 MG PO TABS: 25.0000 mg | ORAL_TABLET | Freq: Every evening | ORAL | 1 refills | Status: DC | PRN

## 2022-03-07 NOTE — Telephone Encounter (Signed)
Refill sent.

## 2022-03-08 ENCOUNTER — Other Ambulatory Visit: Payer: Self-pay | Admitting: Internal Medicine

## 2022-03-08 DIAGNOSIS — F411 Generalized anxiety disorder: Secondary | ICD-10-CM

## 2022-03-08 NOTE — Telephone Encounter (Signed)
Pt called to follow up on her refill of the: ALPRAZolam (XANAX) 0.25 MG tablet  Last OV:  02/03/2021  Pt is scheduled for a CPE on 05/02/2022  Please advise.  [id number] [address]:  [phone] Fax:  [phone]

## 2022-04-04 ENCOUNTER — Other Ambulatory Visit: Payer: Self-pay | Admitting: Internal Medicine

## 2022-04-04 DIAGNOSIS — F411 Generalized anxiety disorder: Secondary | ICD-10-CM

## 2022-04-04 NOTE — Telephone Encounter (Signed)
Pt called to request a refill of the following:  ALPRAZolam (XANAX) 0.25 MG tablet  LOV:  02/14/21  Please advise.  [id number] [address]:  [phone] Fax:  [phone]

## 2022-05-02 ENCOUNTER — Other Ambulatory Visit: Payer: Self-pay

## 2022-05-02 ENCOUNTER — Ambulatory Visit (INDEPENDENT_AMBULATORY_CARE_PROVIDER_SITE_OTHER): Payer: Medicare Other | Admitting: Internal Medicine

## 2022-05-02 VITALS — BP 122/68 | HR 67 | Temp 98.0°F | Ht 62.0 in | Wt 117.1 lb

## 2022-05-02 DIAGNOSIS — G629 Polyneuropathy, unspecified: Secondary | ICD-10-CM | POA: Diagnosis not present

## 2022-05-02 DIAGNOSIS — Z0000 Encounter for general adult medical examination without abnormal findings: Secondary | ICD-10-CM

## 2022-05-02 DIAGNOSIS — E559 Vitamin D deficiency, unspecified: Secondary | ICD-10-CM

## 2022-05-02 DIAGNOSIS — F411 Generalized anxiety disorder: Secondary | ICD-10-CM

## 2022-05-02 DIAGNOSIS — E781 Pure hyperglyceridemia: Secondary | ICD-10-CM | POA: Diagnosis not present

## 2022-05-02 DIAGNOSIS — E8941 Symptomatic postprocedural ovarian failure: Secondary | ICD-10-CM

## 2022-05-02 LAB — CBC WITH DIFFERENTIAL/PLATELET
Basophils Absolute: 0.1 10*3/uL (ref 0.0–0.1)
Basophils Relative: 1.5 % (ref 0.0–3.0)
Eosinophils Absolute: 0.1 10*3/uL (ref 0.0–0.7)
Eosinophils Relative: 2.7 % (ref 0.0–5.0)
HCT: 37.2 % (ref 36.0–46.0)
Hemoglobin: 12.6 g/dL (ref 12.0–15.0)
Lymphocytes Relative: 26.9 % (ref 12.0–46.0)
Lymphs Abs: 1.2 10*3/uL (ref 0.7–4.0)
MCHC: 33.9 g/dL (ref 30.0–36.0)
MCV: 87.4 fl (ref 78.0–100.0)
Monocytes Absolute: 0.5 10*3/uL (ref 0.1–1.0)
Monocytes Relative: 10.8 % (ref 3.0–12.0)
Neutro Abs: 2.7 10*3/uL (ref 1.4–7.7)
Neutrophils Relative %: 58.1 % (ref 43.0–77.0)
Platelets: 216 10*3/uL (ref 150.0–400.0)
RBC: 4.25 Mil/uL (ref 3.87–5.11)
RDW: 13.3 % (ref 11.5–15.5)
WBC: 4.6 10*3/uL (ref 4.0–10.5)

## 2022-05-02 LAB — COMPREHENSIVE METABOLIC PANEL
ALT: 16 U/L (ref 0–35)
AST: 18 U/L (ref 0–37)
Albumin: 3.9 g/dL (ref 3.5–5.2)
Alkaline Phosphatase: 49 U/L (ref 39–117)
BUN: 17 mg/dL (ref 6–23)
CO2: 30 mEq/L (ref 19–32)
Calcium: 9.2 mg/dL (ref 8.4–10.5)
Chloride: 103 mEq/L (ref 96–112)
Creatinine, Ser: 0.88 mg/dL (ref 0.40–1.20)
GFR: 64.59 mL/min (ref >60.00)
Glucose, Bld: 97 mg/dL (ref 70–99)
Potassium: 4.5 mEq/L (ref 3.5–5.1)
Sodium: 138 mEq/L (ref 135–145)
Total Bilirubin: 0.7 mg/dL (ref 0.2–1.2)
Total Protein: 6.8 g/dL (ref 6.0–8.3)

## 2022-05-02 LAB — LIPID PANEL
Cholesterol: 156 mg/dL (ref 0–200)
HDL: 70.2 mg/dL (ref >39.00)
LDL Cholesterol: 61 mg/dL (ref 0–99)
NonHDL: 86.13
Total CHOL/HDL Ratio: 2
Triglycerides: 127 mg/dL (ref 0.0–149.0)
VLDL: 25.4 mg/dL (ref 0.0–40.0)

## 2022-05-02 MED ORDER — ESTRADIOL 0.1 MG/GM VA CREA: 1.0000 | TOPICAL_CREAM | VAGINAL | 0 refills | Status: AC

## 2022-05-02 MED ORDER — ESTRADIOL 1 MG PO TABS: 1.0000 mg | ORAL_TABLET | Freq: Every day | ORAL | 1 refills | Status: DC

## 2022-05-02 MED ORDER — ALPRAZOLAM 0.25 MG PO TABS: 0.2500 mg | ORAL_TABLET | Freq: Four times a day (QID) | ORAL | 2 refills | Status: DC

## 2022-05-02 NOTE — Progress Notes (Signed)
Established Patient Office Visit     CC/Reason for Visit: Subsequent Medicare wellness visit  HPI: Elizabeth Ward is a 76 y.o. female who is coming in today for the above mentioned reasons. Past Medical History is significant for: Anxiety/depression, vitamin D deficiency, insomnia.  She uses alprazolam 3-4 times a day and has done this for years.  She has significant peripheral neuropathy and she feels like this is what mainly affects her health.  She has a lot on her plate being the primary caregiver for both her 35 year old grandson and her husband who is now paralyzed from Devic's disease.  She has routine eye and dental care.  She has been told she will need left cataract surgery but has deferred due to timing issues.  She is overdue for all age-appropriate cancer screenings and vaccines.   Past Medical/Surgical History: Past Medical History:  Diagnosis Date   ANXIETY 08/14/2008   INSOMNIA 09/07/2009   Irritable bowel syndrome 09/07/2009   MENOPAUSE, SURGICAL 08/14/2008   Shingles    UNSPECIFIED VAGINITIS AND VULVOVAGINITIS 06/04/2010    Past Surgical History:  Procedure Laterality Date   ABDOMINAL HYSTERECTOMY     CATARACT EXTRACTION Right    COLECTOMY     partial   EYE SURGERY     TONSILLECTOMY     TONSILLECTOMY      Social History:  reports that she has never smoked. She has never used smokeless tobacco. She reports current alcohol use of about 7.0 standard drinks of alcohol per week. She reports that she does not use drugs.  Allergies: Allergies  Allergen Reactions   Azithromycin Nausea And Vomiting   Prochlorperazine Edisylate Other (See Comments)    Tongue spasms(tongue would droop back in to the throat    Tape Rash    Family History:  Family History  Problem Relation Age of Onset   Hodgkin's lymphoma Mother    Lung cancer Father    Heart attack Son 47     Current Outpatient Medications:    B Complex Vitamins (B COMPLEX PO), Take by mouth daily.,  Disp: , Rfl:    benzonatate (TESSALON PERLES) 100 MG capsule, Take 1 capsule (100 mg total) by mouth 3 (three) times daily as needed for cough., Disp: 30 capsule, Rfl: 0   FLUoxetine (PROZAC) 10 MG tablet, TAKE 1 TABLET BY MOUTH EVERY DAY, Disp: 90 tablet, Rfl: 0   gabapentin (NEURONTIN) 100 MG capsule, TAKE 1 CAPSULE BY MOUTH TWICE A DAY, Disp: 180 capsule, Rfl: 1   ibuprofen (ADVIL,MOTRIN) 600 MG tablet, TAKE ONE TABLET BY MOUTH EVERY 6-8 HOURS AS NEEDED FOR PAIN, Disp: , Rfl: 0   traZODone (DESYREL) 50 MG tablet, Take 0.5 tablets (25 mg total) by mouth at bedtime as needed., Disp: 45 tablet, Rfl: 1   ALPRAZolam (XANAX) 0.25 MG tablet, Take 1 tablet (0.25 mg total) by mouth 4 (four) times daily., Disp: 120 tablet, Rfl: 2   [START ON 05/04/2022] estradiol (ESTRACE) 0.1 MG/GM vaginal cream, Place 1 Applicatorful vaginally 2 (two) times a week., Disp: 127.5 g, Rfl: 0   estradiol (ESTRACE) 1 MG tablet, Take 1 tablet (1 mg total) by mouth daily., Disp: 90 tablet, Rfl: 1   fluticasone (FLONASE) 50 MCG/ACT nasal spray, Place 2 sprays into both nostrils daily. (Patient not taking: Reported on 05/02/2022), Disp: 16 g, Rfl: 6  Review of Systems:  Constitutional: Denies fever, chills, diaphoresis, appetite change and fatigue.  HEENT: Denies photophobia, eye pain, redness, hearing loss, ear pain,  congestion, sore throat, rhinorrhea, sneezing, mouth sores, trouble swallowing, neck pain, neck stiffness and tinnitus.   Respiratory: Denies SOB, DOE, cough, chest tightness,  and wheezing.   Cardiovascular: Denies chest pain, palpitations and leg swelling.  Gastrointestinal: Denies nausea, vomiting, abdominal pain, diarrhea, constipation, blood in stool and abdominal distention.  Genitourinary: Denies dysuria, urgency, frequency, hematuria, flank pain and difficulty urinating.  Endocrine: Denies: hot or cold intolerance, sweats, changes in hair or nails, polyuria, polydipsia. Musculoskeletal: Denies myalgias,  back pain, joint swelling, arthralgias and gait problem.  Skin: Denies pallor, rash and wound.  Neurological: Denies dizziness, seizures, syncope, weakness, light-headedness, numbness and headaches.  Hematological: Denies adenopathy. Easy bruising, personal or family bleeding history  Psychiatric/Behavioral: Denies suicidal ideation, mood changes, confusion, nervousness, sleep disturbance and agitation    Physical Exam: Vitals:   05/02/22 1047  BP: 122/68  Pulse: 67  Temp: 98 F (36.7 C)  TempSrc: Oral  SpO2: 94%  Weight: 117 lb 1.6 oz (53.1 kg)  Height: '5\' 2"'$  (1.575 m)    Body mass index is 21.42 kg/m.   Constitutional: NAD, calm, comfortable Eyes: PERRL, lids and conjunctivae normal, wears corrective lenses ENMT: Mucous membranes are moist. Posterior pharynx clear of any exudate or lesions. Normal dentition. Tympanic membrane is pearly white, no erythema or bulging. Neck: normal, supple, no masses, no thyromegaly Respiratory: clear to auscultation bilaterally, no wheezing, no crackles. Normal respiratory effort. No accessory muscle use.  Cardiovascular: Regular rate and rhythm, no murmurs / rubs / gallops. No extremity edema. 2+ pedal pulses. No carotid bruits.  Abdomen: no tenderness, no masses palpated. No hepatosplenomegaly. Bowel sounds positive.  Musculoskeletal: no clubbing / cyanosis. No joint deformity upper and lower extremities. Good ROM, no contractures. Normal muscle tone.  Skin: no rashes, lesions, ulcers. No induration Neurologic: CN 2-12 grossly intact. Sensation intact, DTR normal. Strength 5/5 in all 4.  Psychiatric: Normal judgment and insight. Alert and oriented x 3. Normal mood.    Subsequent Medicare wellness visit   1. Risk factors, based on past  M,S,F -cardiovascular disease risk factors include age only   2.  Physical activities: Activities of daily living keep her busy   3.  Depression/mood: History of depression   4.  Hearing: No perceived  issues   5.  ADL's: Independent in all ADLs   6.  Fall risk: Low fall risk   7.  Home safety: No problems identified   8.  Height weight, and visual acuity: height and weight as above, vision:  Vision Screening   Right eye Left eye Both eyes  Without correction     With correction '20/40 20/32 20/25 '$     9.  Counseling: Advised to update age-appropriate immunizations and cancer screenings   10. Lab orders based on risk factors: Laboratory update will be reviewed   11. Referral : None today   12. Care plan: Follow up with me in 6 months   13. Cognitive assessment: no cognitive impairment.   14. Screening: Patient provided with a written and personalized 5-10 year screening schedule in the AVS.  yes   15. Provider List Update:  PCP only  16. Advance Directives: full code   17. Opioids: Patient is not on any opioid prescriptions and has no risk factors for a substance use disorder.   [place] Office Visit from 05/02/2022 in [place] at [place]  PHQ-9 Total Score 4          04/09/2018   11:03 AM 03/31/2020  11:11 AM 02/03/2021    1:12 PM 02/25/2021    1:15 PM 05/02/2022   11:53 AM  Fall Risk  Falls in the past year? No 0 0 0 0  Was there an injury with Fall?  0 0 0 0  Fall Risk Category Calculator  0 0 0 0  Fall Risk Category  Low Low Low Low  Patient Fall Risk Level    Low fall risk Low fall risk  Patient at Risk for Falls Due to     No Fall Risks  Fall risk Follow up    Education provided;Falls prevention discussed Falls evaluation completed     Impression and Plan:  Medicare annual wellness visit, subsequent  Hypertriglyceridemia - Plan: CBC with Differential/Platelet, Comprehensive metabolic panel, Lipid panel, Lipid panel, Comprehensive metabolic panel, CBC with Differential/Platelet  Vitamin D deficiency - Plan: VITAMIN D 25 Hydroxy (Vit-D Deficiency, Fractures), VITAMIN D 25 Hydroxy (Vit-D Deficiency, Fractures)  Anxiety  state  Neuropathy - Plan: Vitamin B12, Vitamin B12    -Recommend routine eye and dental care. -Immunizations: Due for flu/ COVID, PNA, tdap and shingles but declines all today despite counseling. -Healthy lifestyle discussed in detail. -Labs to be updated today. -Colon cancer screening: overdue, declines -Breast cancer screening: overdue, declines -Cervical cancer screening: declines due to age; has also had a hysterectomy. -Lung cancer screening: Not applicable. -Prostate cancer screening: Not applicable -DEXA: declines  -Estradiol and alprazolam refilled today.    Lelon Frohlich, MD [facility]

## 2022-05-03 ENCOUNTER — Telehealth: Payer: Self-pay | Admitting: Internal Medicine

## 2022-05-03 ENCOUNTER — Other Ambulatory Visit: Payer: Self-pay | Admitting: Internal Medicine

## 2022-05-03 DIAGNOSIS — E559 Vitamin D deficiency, unspecified: Secondary | ICD-10-CM

## 2022-05-03 LAB — VITAMIN D 25 HYDROXY (VIT D DEFICIENCY, FRACTURES): VITD: 27.21 ng/mL — ABNORMAL LOW (ref 30.00–100.00)

## 2022-05-03 LAB — VITAMIN B12: Vitamin B-12: 559 pg/mL (ref 211–911)

## 2022-05-03 MED ORDER — VITAMIN D (ERGOCALCIFEROL) 1.25 MG (50000 UNIT) PO CAPS: 50000.0000 [IU] | ORAL_CAPSULE | ORAL | 0 refills | Status: DC

## 2022-05-03 NOTE — Telephone Encounter (Signed)
Pt states she had a missed call.

## 2022-05-03 NOTE — Telephone Encounter (Signed)
Returned call to patient. Gave lab results, patient verbalized understanding.

## 2022-05-03 NOTE — Telephone Encounter (Signed)
Pt called, returning CMA's call. CMA was unavailable. Pt asked that CMA call back at her earliest convenience.  *Pt added its ok to leave a detailed message

## 2022-05-17 ENCOUNTER — Other Ambulatory Visit: Payer: Self-pay | Admitting: Internal Medicine

## 2022-05-17 DIAGNOSIS — G6289 Other specified polyneuropathies: Secondary | ICD-10-CM

## 2022-06-02 ENCOUNTER — Other Ambulatory Visit: Payer: Self-pay | Admitting: Internal Medicine

## 2022-06-02 DIAGNOSIS — F411 Generalized anxiety disorder: Secondary | ICD-10-CM

## 2022-07-07 ENCOUNTER — Other Ambulatory Visit: Payer: Self-pay | Admitting: Internal Medicine

## 2022-07-10 NOTE — Telephone Encounter (Signed)
Last OV 05/02/22

## 2022-07-18 ENCOUNTER — Other Ambulatory Visit: Payer: Self-pay | Admitting: Internal Medicine

## 2022-07-18 DIAGNOSIS — E559 Vitamin D deficiency, unspecified: Secondary | ICD-10-CM

## 2022-07-20 ENCOUNTER — Other Ambulatory Visit: Payer: Self-pay | Admitting: Internal Medicine

## 2022-07-20 DIAGNOSIS — E559 Vitamin D deficiency, unspecified: Secondary | ICD-10-CM

## 2022-08-03 ENCOUNTER — Other Ambulatory Visit: Payer: Self-pay | Admitting: Internal Medicine

## 2022-08-03 DIAGNOSIS — F411 Generalized anxiety disorder: Secondary | ICD-10-CM

## 2022-08-03 NOTE — Telephone Encounter (Signed)
Patient calling to check on progress.

## 2022-08-24 ENCOUNTER — Other Ambulatory Visit: Payer: Self-pay | Admitting: Internal Medicine

## 2022-08-24 MED ORDER — TRAZODONE HCL 50 MG PO TABS: 25.0000 mg | ORAL_TABLET | Freq: Every evening | ORAL | 1 refills | Status: DC | PRN

## 2022-08-24 NOTE — Telephone Encounter (Signed)
Refill sent.

## 2022-08-24 NOTE — Telephone Encounter (Addendum)
Pt called to request a refill of the following:  traZODone (DESYREL) 50 MG tablet   LOV:  05/02/22  = CPE  [id number] [place] [facility] [address]: [phone] Fax: [phone]     Please advise.

## 2022-10-04 ENCOUNTER — Other Ambulatory Visit: Payer: Self-pay | Admitting: Internal Medicine

## 2022-10-04 DIAGNOSIS — G6289 Other specified polyneuropathies: Secondary | ICD-10-CM

## 2022-10-10 DIAGNOSIS — Z85828 Personal history of other malignant neoplasm of skin: Secondary | ICD-10-CM | POA: Diagnosis not present

## 2022-10-10 DIAGNOSIS — L570 Actinic keratosis: Secondary | ICD-10-CM | POA: Diagnosis not present

## 2022-10-10 DIAGNOSIS — L820 Inflamed seborrheic keratosis: Secondary | ICD-10-CM | POA: Diagnosis not present

## 2022-10-10 DIAGNOSIS — C4441 Basal cell carcinoma of skin of scalp and neck: Secondary | ICD-10-CM | POA: Diagnosis not present

## 2022-10-10 DIAGNOSIS — D485 Neoplasm of uncertain behavior of skin: Secondary | ICD-10-CM | POA: Diagnosis not present

## 2022-10-17 DIAGNOSIS — B309 Viral conjunctivitis, unspecified: Secondary | ICD-10-CM | POA: Diagnosis not present

## 2022-11-01 ENCOUNTER — Other Ambulatory Visit: Payer: Self-pay | Admitting: Internal Medicine

## 2022-11-01 DIAGNOSIS — F411 Generalized anxiety disorder: Secondary | ICD-10-CM

## 2022-11-01 DIAGNOSIS — H1045 Other chronic allergic conjunctivitis: Secondary | ICD-10-CM | POA: Diagnosis not present

## 2022-11-01 DIAGNOSIS — H16143 Punctate keratitis, bilateral: Secondary | ICD-10-CM | POA: Diagnosis not present

## 2022-12-31 ENCOUNTER — Other Ambulatory Visit: Payer: Self-pay | Admitting: Internal Medicine

## 2022-12-31 DIAGNOSIS — F411 Generalized anxiety disorder: Secondary | ICD-10-CM

## 2023-01-11 DIAGNOSIS — L821 Other seborrheic keratosis: Secondary | ICD-10-CM | POA: Diagnosis not present

## 2023-01-11 DIAGNOSIS — Z85828 Personal history of other malignant neoplasm of skin: Secondary | ICD-10-CM | POA: Diagnosis not present

## 2023-01-11 DIAGNOSIS — D1801 Hemangioma of skin and subcutaneous tissue: Secondary | ICD-10-CM | POA: Diagnosis not present

## 2023-01-11 DIAGNOSIS — L570 Actinic keratosis: Secondary | ICD-10-CM | POA: Diagnosis not present

## 2023-01-11 DIAGNOSIS — C44319 Basal cell carcinoma of skin of other parts of face: Secondary | ICD-10-CM | POA: Diagnosis not present

## 2023-01-11 DIAGNOSIS — D224 Melanocytic nevi of scalp and neck: Secondary | ICD-10-CM | POA: Diagnosis not present

## 2023-01-11 DIAGNOSIS — D225 Melanocytic nevi of trunk: Secondary | ICD-10-CM | POA: Diagnosis not present

## 2023-02-05 ENCOUNTER — Other Ambulatory Visit: Payer: Self-pay | Admitting: Internal Medicine

## 2023-02-05 DIAGNOSIS — F411 Generalized anxiety disorder: Secondary | ICD-10-CM

## 2023-02-05 DIAGNOSIS — Z85828 Personal history of other malignant neoplasm of skin: Secondary | ICD-10-CM | POA: Diagnosis not present

## 2023-02-05 DIAGNOSIS — M7981 Nontraumatic hematoma of soft tissue: Secondary | ICD-10-CM | POA: Diagnosis not present

## 2023-02-05 DIAGNOSIS — D692 Other nonthrombocytopenic purpura: Secondary | ICD-10-CM | POA: Diagnosis not present

## 2023-02-05 NOTE — Telephone Encounter (Signed)
Pt called to say she only has 3 pills left.

## 2023-05-03 ENCOUNTER — Other Ambulatory Visit: Payer: Self-pay | Admitting: Internal Medicine

## 2023-05-04 ENCOUNTER — Other Ambulatory Visit: Payer: Self-pay | Admitting: Internal Medicine

## 2023-05-04 DIAGNOSIS — F411 Generalized anxiety disorder: Secondary | ICD-10-CM

## 2023-05-04 NOTE — Telephone Encounter (Signed)
Pt called back to ask if MD can fit her in for a CPE before December? Pt was reminded again that MD is OOO on Fridays. Pt stated her husband is in the hospital and she is very stressed and needs her refill of the ALPRAZolam (ALPRAZolam (XANAX) 0.25 MG tablet)  Pt is insisting that we ask any other MD to send the refill? Pt was informed we cannot promise that another provider will send a refill, as she has not been here in a year and MD is OOO until Monday.  Please call Pt back, at your earliest convenience.

## 2023-05-04 NOTE — Telephone Encounter (Signed)
Pt called back and was scheduled for a regular OV on Thursday, 05/10/23, to discuss medication refill.  Pt will schedule her CPE at a later date. Pt is asking if MD would be more willing to send refill, if she promises to come in on Thursday? Please advise.

## 2023-05-04 NOTE — Telephone Encounter (Signed)
Prescription Request  05/04/2023  LOV: 05/02/2022 = Last CPE  What is the name of the medication or equipment? ALPRAZolam (XANAX) 0.25 MG tablet  Pt informed MD is OOO on Fridays. Pt has not been here in a year.  Pt needs to be scheduled for a CPE, but MD does not have anything until December 2024. Please advise.   Have you contacted your pharmacy to request a refill? Yes   Which pharmacy would you like this sent to?   [id number] [place] [place] - [address] AT [facility] [address] [place] [address] Phone: [phone] Fax: [phone]    Patient notified that their request is being sent to the clinical staff for review and that they should receive a response within 2 business days.   Please advise at Mobile [phone] (mobile)

## 2023-05-07 MED ORDER — ALPRAZOLAM 0.25 MG PO TABS: 0.2500 mg | ORAL_TABLET | Freq: Four times a day (QID) | ORAL | 0 refills | Status: DC

## 2023-05-10 ENCOUNTER — Encounter: Payer: Self-pay | Admitting: Internal Medicine

## 2023-05-10 ENCOUNTER — Ambulatory Visit: Payer: Medicare Other | Admitting: Internal Medicine

## 2023-05-10 VITALS — BP 120/78 | HR 70 | Temp 97.8°F | Wt 114.7 lb

## 2023-05-10 DIAGNOSIS — E559 Vitamin D deficiency, unspecified: Secondary | ICD-10-CM | POA: Diagnosis not present

## 2023-05-10 DIAGNOSIS — G6289 Other specified polyneuropathies: Secondary | ICD-10-CM | POA: Diagnosis not present

## 2023-05-10 DIAGNOSIS — F411 Generalized anxiety disorder: Secondary | ICD-10-CM | POA: Diagnosis not present

## 2023-05-10 DIAGNOSIS — F5101 Primary insomnia: Secondary | ICD-10-CM | POA: Diagnosis not present

## 2023-05-10 DIAGNOSIS — E781 Pure hyperglyceridemia: Secondary | ICD-10-CM

## 2023-05-10 NOTE — Progress Notes (Signed)
Established Patient Office Visit     CC/Reason for Visit: Medication refills  HPI: Elizabeth Ward is a 77 y.o. female who is coming in today for the above mentioned reasons. Past Medical History is significant for: Generalized anxiety disorder and depression, vitamin D deficiency, insomnia and most importantly peripheral neuropathy.  This visit was scheduled that has been over a year since I last saw her and her medications were denied.  She was especially concerned about her low-dose alprazolam.  She is feeling well.  Still under a lot of stress with being the primary caregiver for her 5 year old grandson and a spouse who has partial paralysis from Devic's disease.   Past Medical/Surgical History: Past Medical History:  Diagnosis Date   ANXIETY 08/14/2008   INSOMNIA 09/07/2009   Irritable bowel syndrome 09/07/2009   MENOPAUSE, SURGICAL 08/14/2008   Shingles    UNSPECIFIED VAGINITIS AND VULVOVAGINITIS 06/04/2010    Past Surgical History:  Procedure Laterality Date   ABDOMINAL HYSTERECTOMY     CATARACT EXTRACTION Right    COLECTOMY     partial   EYE SURGERY     TONSILLECTOMY     TONSILLECTOMY      Social History:  reports that she has never smoked. She has never used smokeless tobacco. She reports current alcohol use of about 7.0 standard drinks of alcohol per week. She reports that she does not use drugs.  Allergies: Allergies  Allergen Reactions   Azithromycin Nausea And Vomiting   Prochlorperazine Edisylate Other (See Comments)    Tongue spasms(tongue would droop back in to the throat    Tape Rash    Family History:  Family History  Problem Relation Age of Onset   Hodgkin's lymphoma Mother    Lung cancer Father    Heart attack Son 31     Current Outpatient Medications:    ALPRAZolam (XANAX) 0.25 MG tablet, Take 1 tablet (0.25 mg total) by mouth 4 (four) times daily., Disp: 120 tablet, Rfl: 0   B Complex Vitamins (B COMPLEX PO), Take by mouth daily., Disp:  , Rfl:    benzonatate (TESSALON PERLES) 100 MG capsule, Take 1 capsule (100 mg total) by mouth 3 (three) times daily as needed for cough., Disp: 30 capsule, Rfl: 0   estradiol (ESTRACE) 0.1 MG/GM vaginal cream, Place 1 Applicatorful vaginally 2 (two) times a week., Disp: 127.5 g, Rfl: 0   estradiol (ESTRACE) 1 MG tablet, TAKE 1 TABLET BY MOUTH EVERY DAY, Disp: 90 tablet, Rfl: 1   FLUoxetine (PROZAC) 10 MG tablet, TAKE 1 TABLET BY MOUTH EVERY DAY, Disp: 90 tablet, Rfl: 1   fluticasone (FLONASE) 50 MCG/ACT nasal spray, Place 2 sprays into both nostrils daily., Disp: 16 g, Rfl: 6   gabapentin (NEURONTIN) 100 MG capsule, TAKE 1 CAPSULE BY MOUTH TWICE A DAY, Disp: 180 capsule, Rfl: 1   ibuprofen (ADVIL,MOTRIN) 600 MG tablet, TAKE ONE TABLET BY MOUTH EVERY 6-8 HOURS AS NEEDED FOR PAIN, Disp: , Rfl: 0   traZODone (DESYREL) 50 MG tablet, Take 0.5 tablets (25 mg total) by mouth at bedtime as needed., Disp: 45 tablet, Rfl: 1  Review of Systems:  Negative unless indicated in HPI.   Physical Exam: Vitals:   05/10/23 1132  BP: 120/78  Pulse: 70  Temp: 97.8 F (36.6 C)  TempSrc: Oral  SpO2: 99%  Weight: 114 lb 11.2 oz (52 kg)    Body mass index is 20.98 kg/m.   Physical Exam Vitals reviewed.  Constitutional:  Appearance: Normal appearance.  HENT:     Head: Normocephalic and atraumatic.  Eyes:     Conjunctiva/sclera: Conjunctivae normal.     Pupils: Pupils are equal, round, and reactive to light.  Cardiovascular:     Rate and Rhythm: Normal rate and regular rhythm.  Pulmonary:     Effort: Pulmonary effort is normal.     Breath sounds: Normal breath sounds.  Skin:    General: Skin is warm and dry.  Neurological:     General: No focal deficit present.     Mental Status: She is alert and oriented to person, place, and time.  Psychiatric:        Mood and Affect: Mood normal.        Behavior: Behavior normal.        Thought Content: Thought content normal.        Judgment:  Judgment normal.      Impression and Plan:  Anxiety state  Primary insomnia  Other polyneuropathy  Vitamin D deficiency  Hypertriglyceridemia  -Alprazolam refilled earlier this week.  She takes them 3-4 times a day, low-dose 0.25 mg. -Check labs when she returns for visit.   Time spent:31 minutes reviewing chart, interviewing and examining patient and formulating plan of care.     Chaya Jan, MD [facility]

## 2023-05-10 NOTE — Telephone Encounter (Signed)
Patient came in for an office visit 05/10/23.

## 2023-05-20 ENCOUNTER — Other Ambulatory Visit: Payer: Self-pay | Admitting: Internal Medicine

## 2023-05-21 ENCOUNTER — Other Ambulatory Visit: Payer: Self-pay | Admitting: Internal Medicine

## 2023-05-21 DIAGNOSIS — G6289 Other specified polyneuropathies: Secondary | ICD-10-CM

## 2023-05-21 NOTE — Telephone Encounter (Signed)
[id number] [place], [place] - [address] Phone: [phone]  Fax: [phone]     gabapentin gabapentin (NEURONTIN) 100 MG capsule  Pt is completely out of this medication.

## 2023-05-21 NOTE — Telephone Encounter (Signed)
[id number] - [place], [place] - [address] Phone: [phone]  Fax: [phone]     traZODone traZODone (DESYREL) 50 MG tablet  Pt is completely out of this medication.

## 2023-06-06 ENCOUNTER — Other Ambulatory Visit: Payer: Self-pay | Admitting: Internal Medicine

## 2023-06-06 DIAGNOSIS — F411 Generalized anxiety disorder: Secondary | ICD-10-CM

## 2023-06-13 ENCOUNTER — Telehealth: Payer: Self-pay | Admitting: Internal Medicine

## 2023-06-13 NOTE — Telephone Encounter (Signed)
Pt is calling and would like to added gabapentin (NEURONTIN 50 mg in morning and take 100 mg twice a day also  [id number] - [place], [place] - [address] AT [facility] [address] Phone: [phone]  Fax: [phone]

## 2023-06-18 NOTE — Telephone Encounter (Signed)
Patient is requesting a prescription of :  Gabapentin 50 mg in the morning And Gabapentin 100 mg take two in the evening.  Okay to send?

## 2023-06-18 NOTE — Telephone Encounter (Signed)
Attempted to call the patient, but the voicemail is full.

## 2023-06-18 NOTE — Telephone Encounter (Signed)
Pt called, returning CMA's call. CMA was with a patient Pt asked that CMA call back at her earliest convenience.

## 2023-06-19 NOTE — Telephone Encounter (Signed)
Patient is going to call her pharmacy to see if Gabapentin comes in 50 mg tablet.

## 2023-06-19 NOTE — Telephone Encounter (Signed)
She is worried that the 100 mg might be too strong and make her sleepy.

## 2023-06-19 NOTE — Telephone Encounter (Signed)
Says it comes in a powder form, 25mg . Requesting a call

## 2023-06-19 NOTE — Telephone Encounter (Signed)
Left detailed message on machine with Dr Hardie Shackleton recommendation.  Waiting on patient's decision.

## 2023-07-04 ENCOUNTER — Other Ambulatory Visit: Payer: Self-pay | Admitting: Internal Medicine

## 2023-07-04 DIAGNOSIS — F411 Generalized anxiety disorder: Secondary | ICD-10-CM

## 2023-07-09 ENCOUNTER — Ambulatory Visit (INDEPENDENT_AMBULATORY_CARE_PROVIDER_SITE_OTHER): Payer: Medicare Other

## 2023-07-09 VITALS — Ht 62.0 in | Wt 114.0 lb

## 2023-07-09 DIAGNOSIS — Z0000 Encounter for general adult medical examination without abnormal findings: Secondary | ICD-10-CM | POA: Diagnosis not present

## 2023-07-09 DIAGNOSIS — Z1231 Encounter for screening mammogram for malignant neoplasm of breast: Secondary | ICD-10-CM | POA: Diagnosis not present

## 2023-07-09 DIAGNOSIS — Z780 Asymptomatic menopausal state: Secondary | ICD-10-CM | POA: Diagnosis not present

## 2023-07-09 NOTE — Progress Notes (Signed)
 Because this visit was a virtual/telehealth visit,  certain criteria was not obtained, such a blood pressure, CBG if applicable, and timed get up and go. Any medications not marked as "taking" were not mentioned during the medication reconciliation part of the visit. Any vitals not documented were not able to be obtained due to this being a telehealth visit or patient was unable to self-report a recent blood pressure reading due to a lack of equipment at home via telehealth. Vitals that have been documented are verbally provided by the patient.   Subjective:   Elizabeth Ward is a 77 y.o. female who presents for Medicare Annual (Subsequent) preventive examination.  Visit Complete: Virtual I connected with  Elizabeth Ward on 07/09/23 by a audio enabled telemedicine application and verified that I am speaking with the correct person using two identifiers.  Patient Location: Home  Provider Location: Home Office  I discussed the limitations of evaluation and management by telemedicine. The patient expressed understanding and agreed to proceed.  Vital Signs: Because this visit was a virtual/telehealth visit, some criteria may be missing or patient reported. Any vitals not documented were not able to be obtained and vitals that have been documented are patient reported.  Patient Medicare AWV questionnaire was completed by the patient on na; I have confirmed that all information answered by patient is correct and no changes since this date.  Cardiac Risk Factors include: advanced age (>30men, >42 women);sedentary lifestyle     Objective:    Today's Vitals   07/09/23 1025 07/09/23 1026  Weight: 114 lb (51.7 kg)   Height: 5\' 2"  (1.575 m)   PainSc:  7    Body mass index is 20.85 kg/m.     07/09/2023   10:23 AM 02/25/2021    1:37 PM  Advanced Directives  Does Patient Have a Medical Advance Directive? No Yes  Type of Advance Directive  Healthcare Power of Attorney  Does patient want to make  changes to medical advance directive?  No - Patient declined  Copy of Healthcare Power of Attorney in Chart?  No - copy requested  Would patient like information on creating a medical advance directive? No - Patient declined     Current Medications (verified) Outpatient Encounter Medications as of 07/09/2023  Medication Sig   ALPRAZolam (XANAX) 0.25 MG tablet TAKE 1 TABLET BY MOUTH FOUR TIMES A DAY   B Complex Vitamins (B COMPLEX PO) Take by mouth daily.   benzonatate (TESSALON PERLES) 100 MG capsule Take 1 capsule (100 mg total) by mouth 3 (three) times daily as needed for cough.   estradiol (ESTRACE) 0.1 MG/GM vaginal cream Place 1 Applicatorful vaginally 2 (two) times a week.   estradiol (ESTRACE) 1 MG tablet TAKE 1 TABLET BY MOUTH EVERY DAY   FLUoxetine (PROZAC) 10 MG tablet TAKE 1 TABLET BY MOUTH EVERY DAY   fluticasone (FLONASE) 50 MCG/ACT nasal spray Place 2 sprays into both nostrils daily.   gabapentin (NEURONTIN) 100 MG capsule TAKE 1 CAPSULE BY MOUTH TWICE A DAY   ibuprofen (ADVIL,MOTRIN) 600 MG tablet TAKE ONE TABLET BY MOUTH EVERY 6-8 HOURS AS NEEDED FOR PAIN   traZODone (DESYREL) 50 MG tablet TAKE 1/2 TABLET (25 MG TOTAL) BY MOUTH AT BEDTIME AS NEEDED.   No facility-administered encounter medications on file as of 07/09/2023.    Allergies (verified) Azithromycin, Prochlorperazine edisylate, and Tape   History: Past Medical History:  Diagnosis Date   ANXIETY 08/14/2008   INSOMNIA 09/07/2009   Irritable bowel  syndrome 09/07/2009   MENOPAUSE, SURGICAL 08/14/2008   Shingles    UNSPECIFIED VAGINITIS AND VULVOVAGINITIS 06/04/2010   Past Surgical History:  Procedure Laterality Date   ABDOMINAL HYSTERECTOMY     CATARACT EXTRACTION Right    COLECTOMY     partial   EYE SURGERY     TONSILLECTOMY     TONSILLECTOMY     Family History  Problem Relation Age of Onset   Hodgkin's lymphoma Mother    Lung cancer Father    Heart attack Son 32   Social History   Socioeconomic  History   Marital status: Married    Spouse name: Not on file   Number of children: 1   Years of education: 16   Highest education level: Bachelor's degree (e.g., BA, AB, BS)  Occupational History   Occupation: homemaker  Tobacco Use   Smoking status: Never   Smokeless tobacco: Never  Vaping Use   Vaping status: Never Used  Substance and Sexual Activity   Alcohol use: Yes    Alcohol/week: 7.0 standard drinks of alcohol    Types: 7 Glasses of wine per week   Drug use: No   Sexual activity: Yes    Birth control/protection: Surgical, Post-menopausal  Other Topics Concern   Not on file  Social History Narrative   Lives with husband and grandson in a 2 story home.  Her son passed away at age 65 from a heart attack so she is raising her grandson.  She works as a Futures trader.  Education: college.    Social Drivers of Health   Financial Resource Strain: Patient Declined (07/09/2023)   Overall Financial Resource Strain (CARDIA)    Difficulty of Paying Living Expenses: Patient declined  Food Insecurity: No Food Insecurity (07/09/2023)   Hunger Vital Sign    Worried About Running Out of Food in the Last Year: Never true    Ran Out of Food in the Last Year: Never true  Transportation Needs: No Transportation Needs (07/09/2023)   PRAPARE - Administrator, Civil Service (Medical): No    Lack of Transportation (Non-Medical): No  Physical Activity: Patient Declined (07/09/2023)   Exercise Vital Sign    Days of Exercise per Week: Patient declined    Minutes of Exercise per Session: Patient declined  Stress: Stress Concern Present (07/09/2023)   [employer] of Occupational Health - Occupational Stress Questionnaire    Feeling of Stress : Very much  Social Connections: Patient Declined (07/09/2023)   Social Connection and Isolation Panel [NHANES]    Frequency of Communication with Friends and Family: Patient declined    Frequency of Social Gatherings with Friends and  Family: Patient declined    Attends Religious Services: Patient declined    Database administrator or Organizations: Patient declined    Attends Engineer, structural: Patient declined    Marital Status: Patient declined    Tobacco Counseling Counseling given: Not Answered   Clinical Intake:  Pre-visit preparation completed: Yes  Pain : 0-10 Pain Score: 7  Pain Type: Neuropathic pain Pain Location: Leg Pain Orientation: Right, Left Pain Radiating Towards: bilateral feet Pain Descriptors / Indicators: Burning, Constant Pain Onset: More than a month ago Pain Frequency: Constant     BMI - recorded: 20.85 Nutritional Status: BMI of 19-24  Normal Nutritional Risks: None Diabetes: No  How often do you need to have someone help you when you read instructions, pamphlets, or other written materials from your doctor or pharmacy?: 1 -  Never  Interpreter Needed?: No  Information entered by ::  Jiah Bari, CMA   Activities of Daily Living    07/09/2023   10:50 AM  In your present state of health, do you have any difficulty performing the following activities:  Hearing? 0  Vision? 0  Difficulty concentrating or making decisions? 0  Walking or climbing stairs? 1  Comment due to neuropathy  Dressing or bathing? 0  Doing errands, shopping? 0  Preparing Food and eating ? N  Using the Toilet? N  In the past six months, have you accidently leaked urine? N  Do you have problems with loss of bowel control? N  Managing your Medications? N  Managing your Finances? N  Housekeeping or managing your Housekeeping? N    Patient Care Team: Philip Aspen, Limmie Patricia, MD as PCP - General (Internal Medicine)  Indicate any recent Medical Services you may have received from other than [facility] providers in the past year (date may be approximate).     Assessment:   This is a routine wellness examination for Elizabeth Ward.  Hearing/Vision screen Hearing Screening - Comments:: Patient  denies any hearing difficulties.     Goals Addressed             This Visit's Progress    Patient Stated       To get to the bottom of her neuropathy.        Depression Screen    07/09/2023   10:48 AM 05/10/2023    1:28 PM 05/02/2022   11:52 AM 05/12/2021   11:12 AM 02/25/2021    1:23 PM 02/03/2021    1:12 PM 12/19/2019   10:48 AM  PHQ 2/9 Scores  PHQ - 2 Score 0 2 1 0 2 5 2   PHQ- 9 Score 0 4 4  11 14 9     Fall Risk    07/09/2023   10:50 AM 05/10/2023   11:34 AM 05/02/2022   11:53 AM 02/25/2021    1:15 PM 02/03/2021    1:12 PM  Fall Risk   Falls in the past year? 0 0 0 0 0  Number falls in past yr: 0 0 0 0 0  Injury with Fall? 0 0 0 0 0  Risk for fall due to : No Fall Risks  No Fall Risks    Follow up Falls prevention discussed Falls evaluation completed Falls evaluation completed Education provided;Falls prevention discussed     MEDICARE RISK AT HOME: Medicare Risk at Home Any stairs in or around the home?: No If so, are there any without handrails?: No Home free of loose throw rugs in walkways, pet beds, electrical cords, etc?: Yes Adequate lighting in your home to reduce risk of falls?: Yes Life alert?: No Use of a cane, walker or w/c?: No Grab bars in the bathroom?: No Shower chair or bench in shower?: No Elevated toilet seat or a handicapped toilet?: No  TIMED UP AND GO:  Was the test performed?  No    Cognitive Function:        07/09/2023   10:32 AM  6CIT Screen  What Year? 0 points  What month? 0 points  What time? 0 points  Count back from 20 0 points  Months in reverse 0 points  Repeat phrase 0 points  Total Score 0 points    Immunizations Immunization History  Administered Date(s) Administered   Influenza Split 05/12/2011, 05/09/2012   Influenza, High Dose Seasonal PF 05/23/2013, 07/05/2018   Tetanus 05/23/2013  TDAP status: Due, Education has been provided regarding the importance of this vaccine. Advised may receive this  vaccine at local pharmacy or Health Dept. Aware to provide a copy of the vaccination record if obtained from local pharmacy or Health Dept. Verbalized acceptance and understanding.  Flu Vaccine status: Due, Education has been provided regarding the importance of this vaccine. Advised may receive this vaccine at local pharmacy or Health Dept. Aware to provide a copy of the vaccination record if obtained from local pharmacy or Health Dept. Verbalized acceptance and understanding.  Pneumococcal vaccine status: Due, Education has been provided regarding the importance of this vaccine. Advised may receive this vaccine at local pharmacy or Health Dept. Aware to provide a copy of the vaccination record if obtained from local pharmacy or Health Dept. Verbalized acceptance and understanding.  Covid-19 vaccine status: Declined, Education has been provided regarding the importance of this vaccine but patient still declined. Advised may receive this vaccine at local pharmacy or Health Dept.or vaccine clinic. Aware to provide a copy of the vaccination record if obtained from local pharmacy or Health Dept. Verbalized acceptance and understanding.  Qualifies for Shingles Vaccine? Yes   Zostavax completed No   Shingrix Completed?: No.    Education has been provided regarding the importance of this vaccine. Patient has been advised to call insurance company to determine out of pocket expense if they have not yet received this vaccine. Advised may also receive vaccine at local pharmacy or Health Dept. Verbalized acceptance and understanding.  Screening Tests Health Maintenance  Topic Date Due   Hepatitis C Screening  Never done   Zoster Vaccines- Shingrix (1 of 2) Never done   Colonoscopy  07/04/2011   Pneumonia Vaccine 22+ Years old (1 of 1 - PCV) Never done   DTaP/Tdap/Td (1 - Tdap) 05/24/2013   DEXA SCAN  06/08/2013   MAMMOGRAM  12/17/2015   COVID-19 Vaccine (1 - 2024-25 season) Never done   Medicare Annual  Wellness (AWV)  05/03/2023   INFLUENZA VACCINE  10/22/2023 (Originally 02/22/2023)   HPV VACCINES  Aged Out    Health Maintenance  Health Maintenance Due  Topic Date Due   Hepatitis C Screening  Never done   Zoster Vaccines- Shingrix (1 of 2) Never done   Colonoscopy  07/04/2011   Pneumonia Vaccine 21+ Years old (1 of 1 - PCV) Never done   DTaP/Tdap/Td (1 - Tdap) 05/24/2013   DEXA SCAN  06/08/2013   MAMMOGRAM  12/17/2015   COVID-19 Vaccine (1 - 2024-25 season) Never done   Medicare Annual Wellness (AWV)  05/03/2023    Colorectal Cancer Screening: Patient declined colorectal cancer screening  Mammogram status: Ordered 07/09/2023. Pt provided with contact info and advised to call to schedule appt.   Bone Density status: Ordered 07/09/2023. Pt provided with contact info and advised to call to schedule appt.  Lung Cancer Screening: (Low Dose CT Chest recommended if Age 63-80 years, 20 pack-year currently smoking OR have quit w/in 15years.) does not qualify.   Lung Cancer Screening Referral: na  Additional Screening:  Hepatitis C Screening: does qualify; patient declined  Vision Screening: Recommended annual ophthalmology exams for early detection of glaucoma and other disorders of the eye. Is the patient up to date with their annual eye exam?  Yes  Who is the provider or what is the name of the office in which the patient attends annual eye exams? [facility] If pt is not established with a provider, would they like to be  referred to a provider to establish care? No .   Dental Screening: Recommended annual dental exams for proper oral hygiene  Diabetic Foot Exam: na  Community Resource Referral / Chronic Care Management: CRR required this visit?  No   CCM required this visit?  No     Plan:     I have personally reviewed and noted the following in the patient's chart:   Medical and social history Use of alcohol, tobacco or illicit drugs  Current medications and  supplements including opioid prescriptions. Patient is not currently taking opioid prescriptions. Functional ability and status Nutritional status Physical activity Advanced directives List of other physicians Hospitalizations, surgeries, and ER visits in previous 12 months Vitals Screenings to include cognitive, depression, and falls Referrals and appointments  In addition, I have reviewed and discussed with patient certain preventive protocols, quality metrics, and best practice recommendations. A written personalized care plan for preventive services as well as general preventive health recommendations were provided to patient.     Jordan Hawks Bill Mcvey, CMA   07/09/2023   After Visit Summary: (MyChart) Due to this being a telephonic visit, the after visit summary with patients personalized plan was offered to patient via MyChart   Nurse Notes: see routing comment

## 2023-07-09 NOTE — Patient Instructions (Signed)
Ms. Elizabeth Ward , Thank you for taking time to come for your Medicare Wellness Visit. I appreciate your ongoing commitment to your health goals. Please review the following plan we discussed and let me know if I can assist you in the future.   Referrals/Orders/Follow-Ups/Clinician Recommendations:  Next Medicare Annual Wellness Visit: July 09, 2024 at 8:00am virtual visit  You have an order for:  []   2D Mammogram  [x]   3D Mammogram  [x]   Bone Density     Please call for appointment:   [facility] [address] [place], [place] [address] [phone]  Make sure to wear two-piece clothing.  No lotions powders or deodorants the day of the appointment Make sure to bring picture ID and insurance card.  Bring list of medications you are currently taking including any supplements.   Schedule your [place] screening mammogram through MyChart!   Log into your MyChart account.  Go to 'Visit' (or 'Appointments' if on mobile App) --> Schedule an Appointment  Under 'Select a Reason for Visit' choose the Mammogram Screening option.  Complete the pre-visit questions and select the time and place that best fits your schedule.    This is a list of the screening recommended for you and due dates:  Health Maintenance  Topic Date Due   Hepatitis C Screening  Never done   Zoster (Shingles) Vaccine (1 of 2) Never done   Colon Cancer Screening  07/04/2011   Pneumonia Vaccine (1 of 1 - PCV) Never done   DTaP/Tdap/Td vaccine (1 - Tdap) 05/24/2013   DEXA scan (bone density measurement)  06/08/2013   Mammogram  12/17/2015   COVID-19 Vaccine (1 - 2024-25 season) Never done   Flu Shot  10/22/2023*   Medicare Annual Wellness Visit  07/08/2024   HPV Vaccine  Aged Out  *Topic was postponed. The date shown is not the original due date.    Advanced directives: (Provided) Advance directive discussed with you today. I have provided a copy for you to complete at home and have  notarized. Once this is complete, please bring a copy in to our office so we can scan it into your chart.   Next Medicare Annual Wellness Visit scheduled for next year: Yes  Preventive Care 79 Years and Older, Female Preventive care refers to lifestyle choices and visits with your health care provider that can promote health and wellness. Preventive care visits are also called wellness exams. What can I expect for my preventive care visit? Counseling Your health care provider may ask you questions about your: Medical history, including: Past medical problems. Family medical history. Pregnancy and menstrual history. History of falls. Current health, including: Memory and ability to understand (cognition). Emotional well-being. Home life and relationship well-being. Sexual activity and sexual health. Lifestyle, including: Alcohol, nicotine or tobacco, and drug use. Access to firearms. Diet, exercise, and sleep habits. Work and work Astronomer. Sunscreen use. Safety issues such as seatbelt and bike helmet use. Physical exam Your health care provider will check your: Height and weight. These may be used to calculate your BMI (body mass index). BMI is a measurement that tells if you are at a healthy weight. Waist circumference. This measures the distance around your waistline. This measurement also tells if you are at a healthy weight and may help predict your risk of certain diseases, such as type 2 diabetes and high blood pressure. Heart rate and blood pressure. Body temperature. Skin for abnormal spots. What immunizations do I need?  Vaccines are usually given at various ages, according to a schedule. Your health care provider will recommend vaccines for you based on your age, medical history, and lifestyle or other factors, such as travel or where you work. What tests do I need? Screening Your health care provider may recommend screening tests for certain conditions. This may  include: Lipid and cholesterol levels. Hepatitis C test. Hepatitis B test. HIV (human immunodeficiency virus) test. STI (sexually transmitted infection) testing, if you are at risk. Lung cancer screening. Colorectal cancer screening. Diabetes screening. This is done by checking your blood sugar (glucose) after you have not eaten for a while (fasting). Mammogram. Talk with your health care provider about how often you should have regular mammograms. BRCA-related cancer screening. This may be done if you have a family history of breast, ovarian, tubal, or peritoneal cancers. Bone density scan. This is done to screen for osteoporosis. Talk with your health care provider about your test results, treatment options, and if necessary, the need for more tests. Follow these instructions at home: Eating and drinking  Eat a diet that includes fresh fruits and vegetables, whole grains, lean protein, and low-fat dairy products. Limit your intake of foods with high amounts of sugar, saturated fats, and salt. Take vitamin and mineral supplements as recommended by your health care provider. Do not drink alcohol if your health care provider tells you not to drink. If you drink alcohol: Limit how much you have to 0-1 drink a day. Know how much alcohol is in your drink. In the [place] one drink equals one 12 oz bottle of beer (355 mL), one 5 oz glass of wine (148 mL), or one 1 oz glass of hard liquor (44 mL). Lifestyle Brush your teeth every morning and night with fluoride toothpaste. Floss one time each day. Exercise for at least 30 minutes 5 or more days each week. Do not use any products that contain nicotine or tobacco. These products include cigarettes, chewing tobacco, and vaping devices, such as e-cigarettes. If you need help quitting, ask your health care provider. Do not use drugs. If you are sexually active, practice safe sex. Use a condom or other form of protection in order to prevent STIs. Take  aspirin only as told by your health care provider. Make sure that you understand how much to take and what form to take. Work with your health care provider to find out whether it is safe and beneficial for you to take aspirin daily. Ask your health care provider if you need to take a cholesterol-lowering medicine (statin). Find healthy ways to manage stress, such as: Meditation, yoga, or listening to music. Journaling. Talking to a trusted person. Spending time with friends and family. Minimize exposure to UV radiation to reduce your risk of skin cancer. Safety Always wear your seat belt while driving or riding in a vehicle. Do not drive: If you have been drinking alcohol. Do not ride with someone who has been drinking. When you are tired or distracted. While texting. If you have been using any mind-altering substances or drugs. Wear a helmet and other protective equipment during sports activities. If you have firearms in your house, make sure you follow all gun safety procedures. What's next? Visit your health care provider once a year for an annual wellness visit. Ask your health care provider how often you should have your eyes and teeth checked. Stay up to date on all vaccines. This information is not intended to replace advice given to you  by your health care provider. Make sure you discuss any questions you have with your health care provider. Document Revised: 01/05/2021 Document Reviewed: 01/05/2021 Elsevier Patient Education  2024 [employer]. Understanding Your Risk for Falls Millions of people have serious injuries from falls each year. It is important to understand your risk of falling. Talk with your health care provider about your risk and what you can do to lower it. If you do have a serious fall, make sure to tell your provider. Falling once raises your risk of falling again. How can falls affect me? Serious injuries from falls are common. These include: Broken bones,  such as hip fractures. Head injuries, such as traumatic brain injuries (TBI) or concussions. A fear of falling can cause you to avoid activities and stay at home. This can make your muscles weaker and raise your risk for a fall. What can increase my risk? There are a number of risk factors that increase your risk for falling. The more risk factors you have, the higher your risk of falling. Serious injuries from a fall happen most often to people who are older than 75 years old. Teenagers and young adults ages 77-29 are also at higher risk. Common risk factors include: Weakness in the lower body. Being generally weak or confused due to long-term (chronic) illness. Dizziness or balance problems. Poor vision. Medicines that cause dizziness or drowsiness. These may include: Medicines for your blood pressure, heart, anxiety, insomnia, or swelling (edema). Pain medicines. Muscle relaxants. Other risk factors include: Drinking alcohol. Having had a fall in the past. Having foot pain or wearing improper footwear. Working at a dangerous job. Having any of the following in your home: Tripping hazards, such as floor clutter or loose rugs. Poor lighting. Pets. Having dementia or memory loss. What actions can I take to lower my risk of falling?     Physical activity Stay physically fit. Do strength and balance exercises. Consider taking a regular class to build strength and balance. Yoga and tai chi are good options. Vision Have your eyes checked every year and your prescription for glasses or contacts updated as needed. Shoes and walking aids Wear non-skid shoes. Wear shoes that have rubber soles and low heels. Do not wear high heels. Do not walk around the house in socks or slippers. Use a cane or walker as told by your provider. Home safety Attach secure railings on both sides of your stairs. Install grab bars for your bathtub, shower, and toilet. Use a non-skid mat in your bathtub or  shower. Attach bath mats securely with double-sided, non-slip rug tape. Use good lighting in all rooms. Keep a flashlight near your bed. Make sure there is a clear path from your bed to the bathroom. Use night-lights. Do not use throw rugs. Make sure all carpeting is taped or tacked down securely. Remove all clutter from walkways and stairways, including extension cords. Repair uneven or broken steps and floors. Avoid walking on icy or slippery surfaces. Walk on the grass instead of on icy or slick sidewalks. Use ice melter to get rid of ice on walkways in the winter. Use a cordless phone. Questions to ask your health care provider Can you help me check my risk for a fall? Do any of my medicines make me more likely to fall? Should I take a vitamin D supplement? What exercises can I do to improve my strength and balance? Should I make an appointment to have my vision checked? Do I need a  bone density test to check for weak bones (osteoporosis)? Would it help to use a cane or a walker? Where to find more information Centers for Disease Control and Prevention, STEADI: TonerPromos.no Community-Based Fall Prevention Programs: TonerPromos.no [employer] on Aging: BaseRingTones.pl Contact a health care provider if: You fall at home. You are afraid of falling at home. You feel weak, drowsy, or dizzy. This information is not intended to replace advice given to you by your health care provider. Make sure you discuss any questions you have with your health care provider. Document Revised: 03/13/2022 Document Reviewed: 03/13/2022 Elsevier Patient Education  2024 Elsevier Inc. Bone Density Test A bone density test uses a type of X-ray to measure the amount of calcium and other minerals in a person's bones. It can measure bone density in the hip and the spine. The test is similar to having a regular X-ray. This test may also be called: Bone densitometry. Bone mineral density test. Dual-energy X-ray  absorptiometry (DEXA). You may have this test to: Diagnose a condition that causes weak or thin bones (osteoporosis). Screen you for osteoporosis. Predict your risk for a broken bone (fracture). Determine how well your osteoporosis treatment is working. Tell a health care provider about: Any allergies you have. All medicines you are taking, including vitamins, herbs, eye drops, creams, and over-the-counter medicines. Any problems you or family members have had with anesthetic medicines. Any blood disorders you have. Any surgeries you have had. Any medical conditions you have. Whether you are pregnant or may be pregnant. Any medical tests you have had within the past 14 days that used contrast material. What are the risks? Generally, this is a safe test. However, it does expose you to a small amount of radiation, which can slightly increase your cancer risk. What happens before the test? Do not take any calcium supplements within the 24 hours before your test. You will need to remove all metal jewelry, eyeglasses, removable dental appliances, and any other metal objects on your body. What happens during the test?  You will lie down on an exam table. There will be an X-ray generator below you and an imaging device above you. Other devices, such as boxes or braces, may be used to position your body properly for the scan. The machine will slowly scan your body. You will need to keep very still while the machine does the scan. The images will show up on a screen in the room. Images will be examined by a specialist after your test is finished. The procedure may vary among health care providers and hospitals. What can I expect after the test? It is up to you to get the results of your test. Ask your health care provider, or the department that is doing the test, when your results will be ready. Summary A bone density test is an imaging test that uses a type of X-ray to measure the amount of  calcium and other minerals in your bones. The test may be used to diagnose or screen you for a condition that causes weak or thin bones (osteoporosis), predict your risk for a broken bone (fracture), or determine how well your osteoporosis treatment is working. Do not take any calcium supplements within 24 hours before your test. Ask your health care provider, or the department that is doing the test, when your results will be ready. This information is not intended to replace advice given to you by your health care provider. Make sure you discuss any questions you  have with your health care provider. Document Revised: 03/23/2021 Document Reviewed: 12/25/2019 Elsevier Patient Education  2024 [employer].

## 2023-07-12 ENCOUNTER — Ambulatory Visit: Payer: Medicare Other | Admitting: Podiatry

## 2023-07-12 ENCOUNTER — Other Ambulatory Visit: Payer: Self-pay | Admitting: Podiatry

## 2023-07-12 ENCOUNTER — Encounter: Payer: Self-pay | Admitting: Podiatry

## 2023-07-12 ENCOUNTER — Telehealth: Payer: Self-pay | Admitting: Internal Medicine

## 2023-07-12 DIAGNOSIS — M79672 Pain in left foot: Secondary | ICD-10-CM | POA: Diagnosis not present

## 2023-07-12 DIAGNOSIS — M79671 Pain in right foot: Secondary | ICD-10-CM | POA: Diagnosis not present

## 2023-07-12 DIAGNOSIS — G629 Polyneuropathy, unspecified: Secondary | ICD-10-CM | POA: Diagnosis not present

## 2023-07-12 MED ORDER — GABAPENTIN 50 MG PO TABS: 1.0000 | ORAL_TABLET | Freq: Every day | ORAL | 2 refills | Status: DC

## 2023-07-12 NOTE — Patient Instructions (Signed)
Gabapentin Capsules or Tablets What is this medication? GABAPENTIN (GA ba pen tin) treats nerve pain. It may also be used to prevent and control seizures in people with epilepsy. It works by calming overactive nerves in your body. This medicine may be used for other purposes; ask your health care provider or pharmacist if you have questions. COMMON BRAND NAME(S): Active-PAC with Gabapentin, Ascencion Dike, Gralise, Neurontin What should I tell my care team before I take this medication? They need to know if you have any of these conditions: Kidney disease Lung or breathing disease Substance use disorder Suicidal thoughts, plans, or attempt by you or a family member An unusual or allergic reaction to gabapentin, other medications, foods, dyes, or preservatives Pregnant or trying to get pregnant Breastfeeding How should I use this medication? Take this medication by mouth with a glass of water. Follow the directions on the prescription label. You can take it with or without food. If it upsets your stomach, take it with food. Take your medication at regular intervals. Do not take it more often than directed. Do not stop taking except on your care team's advice. If you are directed to break the 600 or 800 mg tablets in half as part of your dose, the extra half tablet should be used for the next dose. If you have not used the extra half tablet within 28 days, it should be thrown away. A special MedGuide will be given to you by the pharmacist with each prescription and refill. Be sure to read this information carefully each time. Talk to your care team about the use of this medication in children. While this medication may be prescribed for children as young as 3 years for selected conditions, precautions do apply. Overdosage: If you think you have taken too much of this medicine contact a poison control center or emergency room at once. NOTE: This medicine is only for you. Do not share this medicine with  others. What if I miss a dose? If you miss a dose, take it as soon as you can. If it is almost time for your next dose, take only that dose. Do not take double or extra doses. What may interact with this medication? Alcohol Antihistamines for allergy, cough, and cold Certain medications for anxiety or sleep Certain medications for depression like amitriptyline, fluoxetine, sertraline Certain medications for seizures like phenobarbital, primidone Certain medications for stomach problems General anesthetics like halothane, isoflurane, methoxyflurane, propofol Local anesthetics like lidocaine, pramoxine, tetracaine Medications that relax muscles for surgery Opioid medications for pain Phenothiazines like chlorpromazine, mesoridazine, prochlorperazine, thioridazine This list may not describe all possible interactions. Give your health care provider a list of all the medicines, herbs, non-prescription drugs, or dietary supplements you use. Also tell them if you smoke, drink alcohol, or use illegal drugs. Some items may interact with your medicine. What should I watch for while using this medication? Visit your care team for regular checks on your progress. You may want to keep a record at home of how you feel your condition is responding to treatment. You may want to share this information with your care team at each visit. You should contact your care team if your seizures get worse or if you have any new types of seizures. Do not stop taking this medication or any of your seizure medications unless instructed by your care team. Stopping your medication suddenly can increase your seizures or their severity. This medication may cause serious skin reactions. They can happen weeks to  months after starting the medication. Contact your care team right away if you notice fevers or flu-like symptoms with a rash. The rash may be red or purple and then turn into blisters or peeling of the skin. Or, you might  notice a red rash with swelling of the face, lips or lymph nodes in your neck or under your arms. Wear a medical identification bracelet or chain if you are taking this medication for seizures. Carry a card that lists all your medications. This medication may affect your coordination, reaction time, or judgment. Do not drive or operate machinery until you know how this medication affects you. Sit up or stand slowly to reduce the risk of dizzy or fainting spells. Drinking alcohol with this medication can increase the risk of these side effects. Your mouth may get dry. Chewing sugarless gum or sucking hard candy, and drinking plenty of water may help. Watch for new or worsening thoughts of suicide or depression. This includes sudden changes in mood, behaviors, or thoughts. These changes can happen at any time but are more common in the beginning of treatment or after a change in dose. Call your care team right away if you experience these thoughts or worsening depression. If you become pregnant while using this medication, you may enroll in the [place] American Antiepileptic Drug Pregnancy Registry by calling [phone]. This registry collects information about the safety of antiepileptic medication use during pregnancy. What side effects may I notice from receiving this medication? Side effects that you should report to your care team as soon as possible: Allergic reactions or angioedema--skin rash, itching, hives, swelling of the face, eyes, lips, tongue, arms, or legs, trouble swallowing or breathing Rash, fever, and swollen lymph nodes Thoughts of suicide or self harm, worsening mood, feelings of depression Trouble breathing Unusual changes in mood or behavior in children after use such as difficulty concentrating, hostility, or restlessness Side effects that usually do not require medical attention (report to your care team if they continue or are  bothersome): Dizziness Drowsiness Nausea Swelling of ankles, feet, or hands Vomiting This list may not describe all possible side effects. Call your doctor for medical advice about side effects. You may report side effects to FDA at 1-800-FDA-1088. Where should I keep my medication? Keep out of reach of children and pets. Store at room temperature between 15 and 30 degrees C (59 and 86 degrees F). Get rid of any unused medication after the expiration date. This medication may cause accidental overdose and death if taken by other adults, children, or pets. To get rid of medications that are no longer needed or have expired: Take the medication to a medication take-back program. Check with your pharmacy or law enforcement to find a location. If you cannot return the medication, check the label or package insert to see if the medication should be thrown out in the garbage or flushed down the toilet. If you are not sure, ask your care team. If it is safe to put it in the trash, empty the medication out of the container. Mix the medication with cat litter, dirt, coffee grounds, or other unwanted substance. Seal the mixture in a bag or container. Put it in the trash. NOTE: This sheet is a summary. It may not cover all possible information. If you have questions about this medicine, talk to your doctor, pharmacist, or health care provider.  2024 Elsevier/Gold Standard (2022-04-25 00:00:00)

## 2023-07-12 NOTE — Progress Notes (Unsigned)
Subjective:   Patient ID: Elizabeth Ward, female   DOB: 77 y.o.   MRN: [id number]   HPI Chief Complaint  Patient presents with   Peripheral Neuropathy    RM#11 Neuropathy patient states has been dealing with this issue for a few year and recently gotten worse.   Started has been ongoing for a few years. Progressively getting worse. On gabapentin but makes tired. She has been oin it for 1-15. years   ROS      Objective:  Physical Exam  ***     Assessment:  ***     Plan:  ***    -order CC- done

## 2023-07-12 NOTE — Telephone Encounter (Signed)
Pt's last CPE took place on 05/02/22. Called Pt to schedule her 2025 CPE with MD.  Pt stated she would love to, but did not have her calendar handy. Pt will call back to schedule.

## 2023-07-14 ENCOUNTER — Other Ambulatory Visit: Payer: Self-pay | Admitting: Podiatry

## 2023-07-14 ENCOUNTER — Encounter: Payer: Self-pay | Admitting: Podiatry

## 2023-07-14 MED ORDER — GABAPENTIN 50 MG PO TABS: 1.0000 | ORAL_TABLET | Freq: Every day | ORAL | 2 refills | Status: DC

## 2023-07-14 MED ORDER — GABAPENTIN 250 MG/5ML PO SOLN: 50.0000 mg | Freq: Every day | ORAL | 0 refills | Status: DC

## 2023-07-31 ENCOUNTER — Telehealth: Payer: Self-pay

## 2023-07-31 ENCOUNTER — Other Ambulatory Visit: Payer: Self-pay | Admitting: Internal Medicine

## 2023-07-31 ENCOUNTER — Ambulatory Visit: Payer: Medicare Other | Admitting: Internal Medicine

## 2023-07-31 DIAGNOSIS — E559 Vitamin D deficiency, unspecified: Secondary | ICD-10-CM

## 2023-07-31 DIAGNOSIS — F411 Generalized anxiety disorder: Secondary | ICD-10-CM

## 2023-07-31 NOTE — Telephone Encounter (Signed)
 Copied from CRM [id number]. Topic: Clinical - Medication Refill >> Jul 31, 2023  4:12 PM Leila C wrote: Most Recent Primary Care Visit:  Provider: ALLYSON PROFFER A  Department: [facility]  Visit Type: MEDICARE AWV, SEQUENTIAL  Date: 07/09/2023  Medication: ALPRAZolam  (XANAX ) 0.25 MG tablet   Has the patient contacted their pharmacy? Yes (Agent: If no, request that the patient contact the pharmacy for the refill. If patient does not wish to contact the pharmacy document the reason why and proceed with request.) (Agent: If yes, when and what did the pharmacy advise?)  Is this the correct pharmacy for this prescription? Yes If no, delete pharmacy and type the correct one.  This is the patient's preferred pharmacy:  [id number] - [place], [place] - [address] AT [facility] [address] [place] [address] Phone: [phone] Fax: [phone]  Has the prescription been filled recently?   Is the patient out of the medication? No, but will be out on this Sunday  Has the patient been seen for an appointment in the last year OR does the patient have an upcoming appointment?   Can we respond through MyChart? No, please call [phone]  Agent: Please be advised that Rx refills may take up to 3 business days. We ask that you follow-up with your pharmacy.

## 2023-07-31 NOTE — Telephone Encounter (Signed)
 Copied from CRM [id number]. Topic: Clinical - Request for Lab/Test Order >> Jul 31, 2023  4:16 PM Leila C wrote: Reason for CRM: Patient [phone] is returning a call from the office. Informed pt of telephone encounter 07/31/2023. Patient is out of the house and will call back to schedule a lab appt. Patient is wondering if there's more labs that is needed, please advise and contact patient.

## 2023-07-31 NOTE — Addendum Note (Signed)
 Addended by: Kern Reap B on: 07/31/2023 02:38 PM   Modules accepted: Orders

## 2023-07-31 NOTE — Telephone Encounter (Signed)
 Left detailed message on machine for patient to schedule a lab appointment for vit d.

## 2023-07-31 NOTE — Telephone Encounter (Signed)
 Copied from CRM [id number]. Topic: Clinical - Request for Lab/Test Order >> Jul 31, 2023  9:11 AM Almira Coaster wrote: Reason for CRM: Patient states she got a call from the office to complete blood work; however, I do not see any recent orders on file.

## 2023-08-01 ENCOUNTER — Telehealth: Payer: Self-pay

## 2023-08-01 ENCOUNTER — Ambulatory Visit (HOSPITAL_COMMUNITY)
Admission: RE | Admit: 2023-08-01 | Discharge: 2023-08-01 | Disposition: A | Discharge status: Home / Self Care | Payer: Medicare Other | Source: Ambulatory Visit | Attending: Podiatry | Admitting: Podiatry

## 2023-08-01 DIAGNOSIS — G629 Polyneuropathy, unspecified: Secondary | ICD-10-CM | POA: Insufficient documentation

## 2023-08-01 LAB — VAS US ABI WITH/WO TBI
Left ABI: 1.12
Right ABI: 1.13

## 2023-08-01 MED ORDER — ALPRAZOLAM 0.25 MG PO TABS: 0.2500 mg | ORAL_TABLET | Freq: Four times a day (QID) | ORAL | 0 refills | Status: DC

## 2023-08-01 NOTE — Telephone Encounter (Signed)
 Refill was sent

## 2023-08-01 NOTE — Telephone Encounter (Signed)
 Copied from CRM [id number]. Topic: Clinical - Prescription Issue >> Aug 01, 2023  9:34 AM Thersia BROCKS wrote: Reason for CRM: Patient called in regarding medication ALPRAZolam  (XANAX ) 0.25 MG tablet stated that the pharmacy stated was denied, is requesting a callback explaining why it was denied

## 2023-08-01 NOTE — Addendum Note (Signed)
 Addended by: Kern Reap B on: 08/01/2023 07:16 AM   Modules accepted: Orders

## 2023-08-01 NOTE — Telephone Encounter (Signed)
 Rx was sent and the patient is aware.

## 2023-08-01 NOTE — Telephone Encounter (Signed)
 Spoke with patient and she has a CPE scheduled for 08/06/23.

## 2023-08-06 ENCOUNTER — Ambulatory Visit: Payer: Medicare Other | Admitting: Internal Medicine

## 2023-08-09 ENCOUNTER — Ambulatory Visit: Payer: Medicare Other | Admitting: Internal Medicine

## 2023-08-31 ENCOUNTER — Other Ambulatory Visit: Payer: Self-pay | Admitting: Internal Medicine

## 2023-08-31 DIAGNOSIS — F411 Generalized anxiety disorder: Secondary | ICD-10-CM

## 2023-08-31 NOTE — Telephone Encounter (Signed)
 Copied from CRM [id number]. Topic: Clinical - Medication Refill >> Aug 31, 2023  2:20 PM Isabell A wrote: Most Recent Primary Care Visit:  Provider: ALLYSON PROFFER A  Department: [facility]  Visit Type: MEDICARE AWV, SEQUENTIAL  Date: 07/09/2023  Medication: ALPRAZolam  (XANAX ) 0.25 MG tablet  Has the patient contacted their pharmacy? Yes (Agent: If no, request that the patient contact the pharmacy for the refill. If patient does not wish to contact the pharmacy document the reason why and proceed with request.) (Agent: If yes, when and what did the pharmacy advise?)  Is this the correct pharmacy for this prescription? Yes If no, delete pharmacy and type the correct one.  This is the patient's preferred pharmacy:  [id number] [place], [place] - [address] AT [facility] [address] [place] [address] Phone: [phone] Fax: [phone]  Has the prescription been filled recently? Yes  Is the patient out of the medication? No  Has the patient been seen for an appointment in the last year OR does the patient have an upcoming appointment? Yes  Can we respond through MyChart? No  Agent: Please be advised that Rx refills may take up to 3 business days. We ask that you follow-up with your pharmacy.

## 2023-09-03 ENCOUNTER — Telehealth: Payer: Self-pay | Admitting: Internal Medicine

## 2023-09-03 MED ORDER — ALPRAZOLAM 0.25 MG PO TABS: 0.2500 mg | ORAL_TABLET | Freq: Four times a day (QID) | ORAL | 0 refills | Status: DC
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
## 2023-09-03 NOTE — Telephone Encounter (Signed)
 Patient is needing this prescription filled today she would like to have the prescription switched to five tabs a day

## 2023-09-04 NOTE — Telephone Encounter (Signed)
Patient is aware

## 2023-09-14 ENCOUNTER — Ambulatory Visit: Payer: Medicare Other | Admitting: Podiatry

## 2023-10-01 ENCOUNTER — Other Ambulatory Visit: Payer: Self-pay | Admitting: Internal Medicine

## 2023-10-01 DIAGNOSIS — F411 Generalized anxiety disorder: Secondary | ICD-10-CM

## 2023-10-10 ENCOUNTER — Other Ambulatory Visit: Payer: Self-pay | Admitting: Podiatry

## 2023-10-26 ENCOUNTER — Ambulatory Visit: Payer: Medicare Other

## 2023-10-31 ENCOUNTER — Ambulatory Visit (INDEPENDENT_AMBULATORY_CARE_PROVIDER_SITE_OTHER): Admitting: Internal Medicine

## 2023-10-31 ENCOUNTER — Encounter: Payer: Self-pay | Admitting: Internal Medicine

## 2023-10-31 VITALS — BP 130/80 | HR 70 | Temp 97.7°F | Wt 141.0 lb

## 2023-10-31 DIAGNOSIS — Z1159 Encounter for screening for other viral diseases: Secondary | ICD-10-CM

## 2023-10-31 DIAGNOSIS — F5101 Primary insomnia: Secondary | ICD-10-CM | POA: Diagnosis not present

## 2023-10-31 DIAGNOSIS — E559 Vitamin D deficiency, unspecified: Secondary | ICD-10-CM

## 2023-10-31 DIAGNOSIS — E781 Pure hyperglyceridemia: Secondary | ICD-10-CM

## 2023-10-31 DIAGNOSIS — F411 Generalized anxiety disorder: Secondary | ICD-10-CM | POA: Diagnosis not present

## 2023-10-31 LAB — CBC WITH DIFFERENTIAL/PLATELET
Basophils Absolute: 0 10*3/uL (ref 0.0–0.1)
Basophils Relative: 1 % (ref 0.0–3.0)
Eosinophils Absolute: 0.1 10*3/uL (ref 0.0–0.7)
Eosinophils Relative: 1.1 % (ref 0.0–5.0)
HCT: 39.5 % (ref 36.0–46.0)
Hemoglobin: 13.2 g/dL (ref 12.0–15.0)
Lymphocytes Relative: 30.2 % (ref 12.0–46.0)
Lymphs Abs: 1.4 10*3/uL (ref 0.7–4.0)
MCHC: 33.3 g/dL (ref 30.0–36.0)
MCV: 88.7 fl (ref 78.0–100.0)
Monocytes Absolute: 0.4 10*3/uL (ref 0.1–1.0)
Monocytes Relative: 9.7 % (ref 3.0–12.0)
Neutro Abs: 2.7 10*3/uL (ref 1.4–7.7)
Neutrophils Relative %: 58 % (ref 43.0–77.0)
Platelets: 247 10*3/uL (ref 150.0–400.0)
RBC: 4.46 Mil/uL (ref 3.87–5.11)
RDW: 13.1 % (ref 11.5–15.5)
WBC: 4.6 10*3/uL (ref 4.0–10.5)

## 2023-10-31 LAB — LIPID PANEL
Cholesterol: 166 mg/dL (ref 0–200)
HDL: 82.9 mg/dL (ref >39.00)
LDL Cholesterol: 68 mg/dL (ref 0–99)
NonHDL: 83.36
Total CHOL/HDL Ratio: 2
Triglycerides: 76 mg/dL (ref 0.0–149.0)
VLDL: 15.2 mg/dL (ref 0.0–40.0)

## 2023-10-31 LAB — COMPREHENSIVE METABOLIC PANEL WITH GFR
ALT: 23 U/L (ref 0–35)
AST: 22 U/L (ref 0–37)
Albumin: 4.4 g/dL (ref 3.5–5.2)
Alkaline Phosphatase: 49 U/L (ref 39–117)
BUN: 13 mg/dL (ref 6–23)
CO2: 31 meq/L (ref 19–32)
Calcium: 9.4 mg/dL (ref 8.4–10.5)
Chloride: 97 meq/L (ref 96–112)
Creatinine, Ser: 0.78 mg/dL (ref 0.40–1.20)
GFR: 73.87 mL/min (ref >60.00)
Glucose, Bld: 93 mg/dL (ref 70–99)
Potassium: 4.7 meq/L (ref 3.5–5.1)
Sodium: 134 meq/L — ABNORMAL LOW (ref 135–145)
Total Bilirubin: 0.8 mg/dL (ref 0.2–1.2)
Total Protein: 7.3 g/dL (ref 6.0–8.3)

## 2023-10-31 LAB — VITAMIN D 25 HYDROXY (VIT D DEFICIENCY, FRACTURES): VITD: 23.95 ng/mL — ABNORMAL LOW (ref 30.00–100.00)

## 2023-10-31 MED ORDER — ALPRAZOLAM 0.25 MG PO TABS: ORAL_TABLET | ORAL | 0 refills | Status: DC

## 2023-10-31 NOTE — Progress Notes (Signed)
 Established Patient Office Visit     CC/Reason for Visit: Requesting alprazolam increase in labs  HPI: Elizabeth Ward is a 78 y.o. female who is coming in today for the above mentioned reasons. Past Medical History is significant for: Insomnia, severe anxiety.  Her husband is paralyzed and currently in an SNF, will be coming home soon.  She is also the legal guardian of her 17 year old grandson.  She has been on alprazolam 0.25 mg which she takes 4 times a day for years.  She is requesting an increase to 5 tablets a day.  She is requesting labs.   Past Medical/Surgical History: Past Medical History:  Diagnosis Date   ANXIETY 08/14/2008   INSOMNIA 09/07/2009   Irritable bowel syndrome 09/07/2009   MENOPAUSE, SURGICAL 08/14/2008   Shingles    UNSPECIFIED VAGINITIS AND VULVOVAGINITIS 06/04/2010    Past Surgical History:  Procedure Laterality Date   ABDOMINAL HYSTERECTOMY     CATARACT EXTRACTION Right    COLECTOMY     partial   EYE SURGERY     TONSILLECTOMY     TONSILLECTOMY      Social History:  reports that she has never smoked. She has never used smokeless tobacco. She reports current alcohol use of about 7.0 standard drinks of alcohol per week. She reports that she does not use drugs.  Allergies: Allergies  Allergen Reactions   Azithromycin Nausea And Vomiting   Prochlorperazine Edisylate Other (See Comments)    Tongue spasms(tongue would droop back in to the throat    Tape Rash    Family History:  Family History  Problem Relation Age of Onset   Hodgkin's lymphoma Mother    Lung cancer Father    Heart attack Son 74     Current Outpatient Medications:    B Complex Vitamins (B COMPLEX PO), Take by mouth daily., Disp: , Rfl:    estradiol (ESTRACE) 0.1 MG/GM vaginal cream, Place 1 Applicatorful vaginally 2 (two) times a week., Disp: 127.5 g, Rfl: 0   estradiol (ESTRACE) 1 MG tablet, TAKE 1 TABLET BY MOUTH EVERY DAY, Disp: 90 tablet, Rfl: 1   FLUoxetine (PROZAC)  10 MG tablet, TAKE 1 TABLET BY MOUTH EVERY DAY, Disp: 90 tablet, Rfl: 0   fluticasone (FLONASE) 50 MCG/ACT nasal spray, Place 2 sprays into both nostrils daily., Disp: 16 g, Rfl: 6   gabapentin (NEURONTIN) 100 MG capsule, TAKE 1 CAPSULE BY MOUTH TWICE A DAY, Disp: 180 capsule, Rfl: 1   gabapentin (NEURONTIN) 250 MG/5ML solution, TAKE 1 ML(50 MG) BY MOUTH DAILY, Disp: 90 mL, Rfl: 0   Gabapentin 50 MG TABS, Take 1 tablet by mouth daily., Disp: 30 tablet, Rfl: 2   ibuprofen (ADVIL,MOTRIN) 600 MG tablet, TAKE ONE TABLET BY MOUTH EVERY 6-8 HOURS AS NEEDED FOR PAIN, Disp: , Rfl: 0   traZODone (DESYREL) 50 MG tablet, TAKE 1/2 TABLET (25 MG TOTAL) BY MOUTH AT BEDTIME AS NEEDED., Disp: 45 tablet, Rfl: 1   ALPRAZolam (XANAX) 0.25 MG tablet, Take 1 tablet by mouth up to 5 times a day as needed, Disp: 150 tablet, Rfl: 0  Review of Systems:  Negative unless indicated in HPI.   Physical Exam: Vitals:   10/31/23 1107  BP: 130/80  Pulse: 70  Temp: 97.7 F (36.5 C)  TempSrc: Oral  SpO2: 99%  Weight: 141 lb (64 kg)    Body mass index is 25.79 kg/m.   Physical Exam Vitals reviewed.  Constitutional:  Appearance: Normal appearance.  HENT:     Head: Normocephalic and atraumatic.  Eyes:     Conjunctiva/sclera: Conjunctivae normal.     Pupils: Pupils are equal, round, and reactive to light.  Cardiovascular:     Rate and Rhythm: Normal rate and regular rhythm.  Pulmonary:     Effort: Pulmonary effort is normal.     Breath sounds: Normal breath sounds.  Skin:    General: Skin is warm and dry.  Neurological:     General: No focal deficit present.     Mental Status: She is alert and oriented to person, place, and time.  Psychiatric:        Mood and Affect: Mood normal.        Behavior: Behavior normal.        Thought Content: Thought content normal.        Judgment: Judgment normal.     Flowsheet Row Office Visit from 10/31/2023 in [facility] [place]  PHQ-9  Total Score 4       Impression and Plan:  Primary insomnia  Anxiety state -     ALPRAZolam; Take 1 tablet by mouth up to 5 times a day as needed  Dispense: 150 tablet; Refill: 0  Vitamin D deficiency -     VITAMIN D 25 Hydroxy (Vit-D Deficiency, Fractures); Future  Hypertriglyceridemia -     Lipid panel; Future -     CBC with Differential/Platelet; Future -     Comprehensive metabolic panel with GFR; Future  Encounter for hepatitis C screening test for low risk patient -     Hepatitis C antibody; Future   -Will increase alprazolam to 0.25 mg 5 times a day. -Check labs.   Time spent:30 minutes reviewing chart, interviewing and examining patient and formulating plan of care.     Chaya Jan, MD [facility]

## 2023-11-01 ENCOUNTER — Encounter: Payer: Self-pay | Admitting: Internal Medicine

## 2023-11-01 ENCOUNTER — Other Ambulatory Visit: Payer: Self-pay | Admitting: Internal Medicine

## 2023-11-01 DIAGNOSIS — E559 Vitamin D deficiency, unspecified: Secondary | ICD-10-CM

## 2023-11-01 MED ORDER — VITAMIN D (ERGOCALCIFEROL) 1.25 MG (50000 UNIT) PO CAPS: 50000.0000 [IU] | ORAL_CAPSULE | ORAL | 0 refills | Status: AC

## 2023-11-04 LAB — HCV RNA,QUANTITATIVE REAL TIME PCR
HCV Quantitative Log: <1.18 {Log_IU}/mL
HCV RNA, PCR, QN: <15 [IU]/mL

## 2023-11-04 LAB — HEPATITIS C ANTIBODY: Hepatitis C Ab: REACTIVE — AB

## 2023-11-06 ENCOUNTER — Telehealth: Payer: Self-pay | Admitting: *Deleted

## 2023-11-06 ENCOUNTER — Other Ambulatory Visit: Payer: Self-pay | Admitting: Internal Medicine

## 2023-11-06 NOTE — Telephone Encounter (Signed)
 Copied from CRM [id number]. Topic: Clinical - Prescription Issue >> Nov 06, 2023  8:47 AM Lovett Ruck C wrote: Reason for CRM: patient's pharmacy is faxing over prescription refill request for traZODone (DESYREL) 50 MG tablet  and patient just wanted to ensure physician was aware refill request was there so that it could be signed. Thank you.

## 2023-11-14 ENCOUNTER — Other Ambulatory Visit: Payer: Self-pay | Admitting: Internal Medicine

## 2023-11-14 ENCOUNTER — Other Ambulatory Visit: Payer: Self-pay | Admitting: Adult Health

## 2023-11-14 DIAGNOSIS — F411 Generalized anxiety disorder: Secondary | ICD-10-CM

## 2023-11-14 DIAGNOSIS — G6289 Other specified polyneuropathies: Secondary | ICD-10-CM

## 2023-12-04 DIAGNOSIS — H0102B Squamous blepharitis left eye, upper and lower eyelids: Secondary | ICD-10-CM | POA: Diagnosis not present

## 2023-12-04 DIAGNOSIS — Z961 Presence of intraocular lens: Secondary | ICD-10-CM | POA: Diagnosis not present

## 2023-12-04 DIAGNOSIS — H0102A Squamous blepharitis right eye, upper and lower eyelids: Secondary | ICD-10-CM | POA: Diagnosis not present

## 2023-12-04 DIAGNOSIS — H2512 Age-related nuclear cataract, left eye: Secondary | ICD-10-CM | POA: Diagnosis not present

## 2023-12-04 DIAGNOSIS — H1031 Unspecified acute conjunctivitis, right eye: Secondary | ICD-10-CM | POA: Diagnosis not present

## 2023-12-05 ENCOUNTER — Telehealth: Payer: Self-pay | Admitting: Internal Medicine

## 2023-12-05 ENCOUNTER — Other Ambulatory Visit: Payer: Self-pay | Admitting: Internal Medicine

## 2023-12-05 DIAGNOSIS — F411 Generalized anxiety disorder: Secondary | ICD-10-CM

## 2023-12-05 NOTE — Telephone Encounter (Unsigned)
 Copied from CRM [id number]. Topic: Clinical - Medication Refill >> Dec 05, 2023 12:31 PM Shardie S wrote: Medication:  ALPRAZolam  (XANAX ) 0.25 MG tablet  Has the patient contacted their pharmacy? No (Agent: If no, request that the patient contact the pharmacy for the refill. If patient does not wish to contact the pharmacy document the reason why and proceed with request.) (Agent: If yes, when and what did the pharmacy advise?)  This is the patient's preferred pharmacy:  [id number] [place], [place] - [address] AT [facility] [address] [place] [address] Phone: [phone] Fax: [phone]   Is this the correct pharmacy for this prescription? Yes If no, delete pharmacy and type the correct one.   Has the prescription been filled recently? No  Is the patient out of the medication? Yes  Has the patient been seen for an appointment in the last year OR does the patient have an upcoming appointment? Yes  Can we respond through MyChart? No  Agent: Please be advised that Rx refills may take up to 3 business days. We ask that you follow-up with your pharmacy.

## 2024-01-03 ENCOUNTER — Other Ambulatory Visit: Payer: Self-pay | Admitting: Internal Medicine

## 2024-01-03 DIAGNOSIS — F411 Generalized anxiety disorder: Secondary | ICD-10-CM

## 2024-01-03 NOTE — Telephone Encounter (Signed)
 Copied from CRM [id number]. Topic: Clinical - Medication Refill >> Jan 03, 2024  3:23 PM Martinique E wrote: Medication: ALPRAZolam  (XANAX ) 0.25 MG tablet  Has the patient contacted their pharmacy? Yes (Agent: If no, request that the patient contact the pharmacy for the refill. If patient does not wish to contact the pharmacy document the reason why and proceed with request.) (Agent: If yes, when and what did the pharmacy advise?)  This is the patient's preferred pharmacy:  [id number] - [place], [place] - [address] AT [facility] [address] [place] [address] Phone: [phone] Fax: [phone]   Is this the correct pharmacy for this prescription? Yes If no, delete pharmacy and type the correct one.   Has the prescription been filled recently? Yes  Is the patient out of the medication? No, few days left.  Has the patient been seen for an appointment in the last year OR does the patient have an upcoming appointment? Yes  Can we respond through MyChart? Yes  Agent: Please be advised that Rx refills may take up to 3 business days. We ask that you follow-up with your pharmacy.

## 2024-01-09 ENCOUNTER — Other Ambulatory Visit: Payer: Self-pay | Admitting: Internal Medicine

## 2024-01-09 ENCOUNTER — Other Ambulatory Visit: Payer: Self-pay | Admitting: Adult Health

## 2024-01-09 DIAGNOSIS — F411 Generalized anxiety disorder: Secondary | ICD-10-CM

## 2024-01-09 NOTE — Telephone Encounter (Unsigned)
 Copied from CRM [id number]. Topic: Clinical - Medication Refill >> Jan 09, 2024 11:12 AM Elizabeth [place] wrote: Medication: FLUoxetine  (PROZAC ) 10 MG tablet - DISCONTINUED  Has the patient contacted their pharmacy? Yes (Agent: If no, request that the patient contact the pharmacy for the refill. If patient does not wish to contact the pharmacy document the reason why and proceed with request.) (Agent: If yes, when and what did the pharmacy advise?)  This is the patient's preferred pharmacy:  [id number] [place], [place] - [address] AT [facility] [address] [place] [address] Phone: [phone] Fax: [phone]   Is this the correct pharmacy for this prescription? Yes If no, delete pharmacy and type the correct one.   Has the prescription been filled recently? Yes  Is the patient out of the medication? Yes  Has the patient been seen for an appointment in the last year OR does the patient have an upcoming appointment? Yes  Can we respond through MyChart? Yes  Agent: Please be advised that Rx refills may take up to 3 business days. We ask that you follow-up with your pharmacy.

## 2024-01-10 MED ORDER — FLUOXETINE HCL 10 MG PO TABS: 10.0000 mg | ORAL_TABLET | Freq: Every day | ORAL | 0 refills | Status: DC

## 2024-01-20 ENCOUNTER — Other Ambulatory Visit: Payer: Self-pay | Admitting: Internal Medicine

## 2024-01-20 DIAGNOSIS — E559 Vitamin D deficiency, unspecified: Secondary | ICD-10-CM

## 2024-01-24 DIAGNOSIS — H2512 Age-related nuclear cataract, left eye: Secondary | ICD-10-CM | POA: Diagnosis not present

## 2024-01-24 DIAGNOSIS — H34231 Retinal artery branch occlusion, right eye: Secondary | ICD-10-CM | POA: Diagnosis not present

## 2024-01-24 DIAGNOSIS — H1031 Unspecified acute conjunctivitis, right eye: Secondary | ICD-10-CM | POA: Diagnosis not present

## 2024-01-24 DIAGNOSIS — Z961 Presence of intraocular lens: Secondary | ICD-10-CM | POA: Diagnosis not present

## 2024-01-30 DIAGNOSIS — L570 Actinic keratosis: Secondary | ICD-10-CM | POA: Diagnosis not present

## 2024-01-30 DIAGNOSIS — L300 Nummular dermatitis: Secondary | ICD-10-CM | POA: Diagnosis not present

## 2024-01-30 DIAGNOSIS — D1801 Hemangioma of skin and subcutaneous tissue: Secondary | ICD-10-CM | POA: Diagnosis not present

## 2024-01-30 DIAGNOSIS — Z85828 Personal history of other malignant neoplasm of skin: Secondary | ICD-10-CM | POA: Diagnosis not present

## 2024-01-30 DIAGNOSIS — L821 Other seborrheic keratosis: Secondary | ICD-10-CM | POA: Diagnosis not present

## 2024-01-30 DIAGNOSIS — L718 Other rosacea: Secondary | ICD-10-CM | POA: Diagnosis not present

## 2024-02-06 ENCOUNTER — Other Ambulatory Visit: Payer: Self-pay | Admitting: Internal Medicine

## 2024-02-06 DIAGNOSIS — F411 Generalized anxiety disorder: Secondary | ICD-10-CM

## 2024-02-06 MED ORDER — ALPRAZOLAM 0.25 MG PO TABS: ORAL_TABLET | ORAL | 0 refills | Status: DC

## 2024-02-06 NOTE — Telephone Encounter (Signed)
 Copied from CRM [id number]. Topic: Clinical - Medication Refill >> Feb 06, 2024  8:59 AM Berneda FALCON wrote: Medication:  ALPRAZolam  (XANAX ) 0.25 MG tablet    Has the patient contacted their pharmacy? Yes (Agent: If no, request that the patient contact the pharmacy for the refill. If patient does not wish to contact the pharmacy document the reason why and proceed with request.) (Agent: If yes, when and what did the pharmacy advise?)  This is the patient's preferred pharmacy:  [id number] [place], [place] - [address] AT [facility] [address] [place] [address] Phone: [phone] Fax: [phone]  Is this the correct pharmacy for this prescription? Yes If no, delete pharmacy and type the correct one.   Has the prescription been filled recently? No  Is the patient out of the medication? No  Has the patient been seen for an appointment in the last year OR does the patient have an upcoming appointment? Yes  Can we respond through MyChart? Yes  Agent: Please be advised that Rx refills may take up to 3 business days. We ask that you follow-up with your pharmacy.

## 2024-02-06 NOTE — Telephone Encounter (Signed)
 01/03/24 150 tabs/0 RF

## 2024-03-10 ENCOUNTER — Other Ambulatory Visit: Payer: Self-pay | Admitting: Internal Medicine

## 2024-03-10 DIAGNOSIS — F411 Generalized anxiety disorder: Secondary | ICD-10-CM

## 2024-03-10 NOTE — Telephone Encounter (Signed)
 Copied from CRM [id number]. Topic: Clinical - Medication Refill >> Mar 10, 2024  8:47 AM Elizabeth [place] wrote: Medication: ALPRAZolam  (XANAX ) 0.25 MG tablet  Has the patient contacted their pharmacy? Yes (Agent: If no, request that the patient contact the pharmacy for the refill. If patient does not wish to contact the pharmacy document the reason why and proceed with request.) (Agent: If yes, when and what did the pharmacy advise?)  This is the patient's preferred pharmacy:  [id number] [place], [place] - [address] AT [facility] [address] [place] [address] Phone: [phone] Fax: [phone]  Is this the correct pharmacy for this prescription? Yes If no, delete pharmacy and type the correct one.   Has the prescription been filled recently? No  Is the patient out of the medication? Yes  Has the patient been seen for an appointment in the last year OR does the patient have an upcoming appointment? Yes, 07/09/2024  Can we respond through MyChart? Yes  Agent: Please be advised that Rx refills may take up to 3 business days. We ask that you follow-up with your pharmacy.

## 2024-04-04 ENCOUNTER — Other Ambulatory Visit: Payer: Self-pay | Admitting: Internal Medicine

## 2024-04-14 ENCOUNTER — Other Ambulatory Visit: Payer: Self-pay | Admitting: Internal Medicine

## 2024-04-14 DIAGNOSIS — F411 Generalized anxiety disorder: Secondary | ICD-10-CM

## 2024-04-16 ENCOUNTER — Telehealth: Payer: Self-pay | Admitting: *Deleted

## 2024-04-16 ENCOUNTER — Telehealth: Payer: Self-pay

## 2024-04-16 NOTE — Telephone Encounter (Signed)
 Copied from CRM #[id number]. Topic: Clinical - Prescription Issue >> Apr 16, 2024 12:20 PM Leah C wrote: Reason for CRM: ALPRAZolam  (XANAX ) 0.25 MG tablet  Patient is calling to follow up on refill request for xanax .    [phone] (m)   [id number] [place], [place] - [address] AT [facility] [address] [place] [address] Phone: [phone] Fax: [phone]

## 2024-04-16 NOTE — Telephone Encounter (Signed)
 Copied from CRM #[id number]. Topic: Clinical - Prescription Issue >> Apr 16, 2024 12:38 PM Berneda F wrote: Reason for CRM: [facility] for her ALPRAZolam  (XANAX ) 0.25 MG tablet and only has 1 day left.  [id number] [place], [place] - [address] AT [facility] [address] [place] [address] Phone: [phone] Fax: [phone] Hours: Open 24 hours  Patient callback is [phone] (home)  Already answered see previous note.

## 2024-04-21 DIAGNOSIS — L309 Dermatitis, unspecified: Secondary | ICD-10-CM | POA: Diagnosis not present

## 2024-04-21 DIAGNOSIS — L570 Actinic keratosis: Secondary | ICD-10-CM | POA: Diagnosis not present

## 2024-04-21 DIAGNOSIS — L821 Other seborrheic keratosis: Secondary | ICD-10-CM | POA: Diagnosis not present

## 2024-04-21 DIAGNOSIS — Z85828 Personal history of other malignant neoplasm of skin: Secondary | ICD-10-CM | POA: Diagnosis not present

## 2024-04-24 ENCOUNTER — Other Ambulatory Visit: Payer: Self-pay | Admitting: Internal Medicine

## 2024-04-24 NOTE — Telephone Encounter (Signed)
 Copied from CRM [id number]. Topic: Clinical - Medication Refill >> Apr 24, 2024 10:06 AM Diannia H wrote: Medication: FLUoxetine  (PROZAC ) 10 MG tablet,  traZODone  (DESYREL ) 50 MG tablet   Has the patient contacted their pharmacy? Yes (Agent: If no, request that the patient contact the pharmacy for the refill. If patient does not wish to contact the pharmacy document the reason why and proceed with request.) (Agent: If yes, when and what did the pharmacy advise?)  This is the patient's preferred pharmacy:  [id number] [place], [place] - [address] AT [facility] [address] [place] [address] Phone: [phone] Fax: [phone]  Is this the correct pharmacy for this prescription? Yes If no, delete pharmacy and type the correct one.   Has the prescription been filled recently? No  Is the patient out of the medication? No  Has the patient been seen for an appointment in the last year OR does the patient have an upcoming appointment? Yes  Can we respond through MyChart? Yes  Agent: Please be advised that Rx refills may take up to 3 business days. We ask that you follow-up with your pharmacy.

## 2024-05-08 ENCOUNTER — Other Ambulatory Visit: Payer: Self-pay | Admitting: Internal Medicine

## 2024-05-08 DIAGNOSIS — G6289 Other specified polyneuropathies: Secondary | ICD-10-CM

## 2024-05-14 ENCOUNTER — Other Ambulatory Visit: Payer: Self-pay | Admitting: Internal Medicine

## 2024-05-14 DIAGNOSIS — F411 Generalized anxiety disorder: Secondary | ICD-10-CM

## 2024-05-14 MED ORDER — ALPRAZOLAM 0.25 MG PO TABS
ORAL_TABLET | ORAL | 0 refills | Status: DC
Start: 2024-05-14 — End: 2024-06-12

## 2024-05-14 NOTE — Telephone Encounter (Signed)
 Copied from CRM [id number]. Topic: Clinical - Medication Refill >> May 14, 2024  8:32 AM Montie POUR wrote: Medication:  ALPRAZolam  (XANAX ) 0.25 MG tablet  Has the patient contacted their pharmacy? Yes (Agent: If no, request that the patient contact the pharmacy for the refill. If patient does not wish to contact the pharmacy document the reason why and proceed with request.) (Agent: If yes, when and what did the pharmacy advise?) Pharmacy needs order to refill  This is the patient's preferred pharmacy:  [id number] [place], [place] - [address] AT [facility] [address] [place] [address] Phone: [phone] Fax: [phone]  Is this the correct pharmacy for this prescription? Yes If no, delete pharmacy and type the correct one.   Has the prescription been filled recently? No  Is the patient out of the medication? No  Has the patient been seen for an appointment in the last year OR does the patient have an upcoming appointment? Yes  Can we respond through MyChart? No  Agent: Please be advised that Rx refills may take up to 3 business days. We ask that you follow-up with your pharmacy.

## 2024-06-02 DIAGNOSIS — H00024 Hordeolum internum left upper eyelid: Secondary | ICD-10-CM | POA: Diagnosis not present

## 2024-06-02 DIAGNOSIS — H1031 Unspecified acute conjunctivitis, right eye: Secondary | ICD-10-CM | POA: Diagnosis not present

## 2024-06-12 ENCOUNTER — Other Ambulatory Visit: Payer: Self-pay | Admitting: Internal Medicine

## 2024-06-12 ENCOUNTER — Telehealth: Payer: Self-pay | Admitting: *Deleted

## 2024-06-12 DIAGNOSIS — F411 Generalized anxiety disorder: Secondary | ICD-10-CM

## 2024-06-12 NOTE — Telephone Encounter (Signed)
 Copied from CRM [id number]. Topic: Clinical - Medical Advice >> Jun 12, 2024  3:01 PM Shereese L wrote: Reason for CRM: patient calling to check the status of the medication refill for ALPRAZolam  (XANAX ) 0.25 MG tablet

## 2024-06-12 NOTE — Telephone Encounter (Signed)
A refill was sent

## 2024-07-09 ENCOUNTER — Ambulatory Visit: Admitting: Internal Medicine

## 2024-07-09 DIAGNOSIS — E781 Pure hyperglyceridemia: Secondary | ICD-10-CM

## 2024-07-09 DIAGNOSIS — Z0000 Encounter for general adult medical examination without abnormal findings: Secondary | ICD-10-CM

## 2024-07-09 DIAGNOSIS — E559 Vitamin D deficiency, unspecified: Secondary | ICD-10-CM

## 2024-07-10 ENCOUNTER — Other Ambulatory Visit: Payer: Self-pay | Admitting: Internal Medicine

## 2024-07-10 DIAGNOSIS — F411 Generalized anxiety disorder: Secondary | ICD-10-CM

## 2024-07-10 NOTE — Telephone Encounter (Signed)
 Patient has scheduled an AWV 07/28/24

## 2024-07-12 ENCOUNTER — Other Ambulatory Visit: Payer: Self-pay | Admitting: Internal Medicine

## 2024-07-28 ENCOUNTER — Ambulatory Visit (INDEPENDENT_AMBULATORY_CARE_PROVIDER_SITE_OTHER): Admitting: Internal Medicine

## 2024-07-28 ENCOUNTER — Encounter: Payer: Self-pay | Admitting: Internal Medicine

## 2024-07-28 VITALS — BP 120/78 | HR 82 | Temp 98.2°F | Ht 62.0 in | Wt 113.7 lb

## 2024-07-28 DIAGNOSIS — Z0000 Encounter for general adult medical examination without abnormal findings: Secondary | ICD-10-CM | POA: Diagnosis not present

## 2024-07-28 DIAGNOSIS — G609 Hereditary and idiopathic neuropathy, unspecified: Secondary | ICD-10-CM

## 2024-07-28 DIAGNOSIS — E781 Pure hyperglyceridemia: Secondary | ICD-10-CM

## 2024-07-28 DIAGNOSIS — M79645 Pain in left finger(s): Secondary | ICD-10-CM | POA: Diagnosis not present

## 2024-07-28 DIAGNOSIS — Z780 Asymptomatic menopausal state: Secondary | ICD-10-CM

## 2024-07-28 DIAGNOSIS — R350 Frequency of micturition: Secondary | ICD-10-CM

## 2024-07-28 DIAGNOSIS — E559 Vitamin D deficiency, unspecified: Secondary | ICD-10-CM

## 2024-07-28 MED ORDER — MELOXICAM 7.5 MG PO TABS: 7.5000 mg | ORAL_TABLET | Freq: Every day | ORAL | 0 refills | Status: AC

## 2024-07-28 NOTE — Progress Notes (Signed)
 "    Established Patient Office Visit     CC/Reason for Visit: Subsequent Medicare wellness visit, discuss acute concern  HPI: Elizabeth Ward is a 79 y.o. female who is coming in today for the above mentioned reasons. Past Medical History is significant for: Generalized anxiety disorder and depression, vitamin D  deficiency, insomnia and most importantly peripheral neuropathy.  Has been having issues with her left thumb and the thenar eminence.  Is due for all age-appropriate vaccines and cancer screenings, due for DEXA.   Past Medical/Surgical History: Past Medical History:  Diagnosis Date   ANXIETY 08/14/2008   INSOMNIA 09/07/2009   Irritable bowel syndrome 09/07/2009   MENOPAUSE, SURGICAL 08/14/2008   Shingles    UNSPECIFIED VAGINITIS AND VULVOVAGINITIS 06/04/2010    Past Surgical History:  Procedure Laterality Date   ABDOMINAL HYSTERECTOMY     CATARACT EXTRACTION Right    COLECTOMY     partial   EYE SURGERY     TONSILLECTOMY     TONSILLECTOMY      Social History:  reports that she has never smoked. She has never used smokeless tobacco. She reports current alcohol use of about 7.0 standard drinks of alcohol per week. She reports that she does not use drugs.  Allergies: Allergies[1]  Family History:  Family History  Problem Relation Age of Onset   Hodgkin's lymphoma Mother    Lung cancer Father    Heart attack Son 72    Current Medications[2]  Review of Systems:  Negative unless indicated in HPI.   Physical Exam: Vitals:   07/28/24 1507  BP: 120/78  Pulse: 82  Temp: 98.2 F (36.8 C)  TempSrc: Oral  SpO2: 99%  Weight: 113 lb 11.2 oz (51.6 kg)  Height: 5' 2 (1.575 m)    Body mass index is 20.8 kg/m.   Subsequent Medicare wellness visit   Visit info / Clinical Intake: Medicare Wellness Visit Type:: Subsequent Annual Wellness Visit Persons participating in visit and providing information:: patient Medicare Wellness Visit Mode:: In-person (required  for WTM) Interpreter Needed?: No Pre-visit prep was completed: no AWV questionnaire completed by patient prior to visit?: no Living arrangements:: with family/others Patient's Overall Health Status Rating: (!) fair Typical amount of pain: some Does pain affect daily life?: (!) yes Are you currently prescribed opioids?: (!) yes  Dietary Habits and Nutritional Risks How many meals a day?: 3 Eats fruit and vegetables daily?: yes Most meals are obtained by: preparing own meals; eating out In the last 2 weeks, have you had any of the following?: none Diabetic:: no  Functional Status Activities of Daily Living (to include ambulation/medication): Independent Ambulation: Independent Medication Administration: Independent Home Management (perform basic housework or laundry): Independent Manage your own finances?: yes Primary transportation is: driving Concerns about vision?: (!) yes (needs eye surgry) Concerns about hearing?: no  Fall Screening Falls in the past year?: 0 Number of falls in past year: 0 Was there an injury with Fall?: 0 Fall Risk Category Calculator: 0 Patient Fall Risk Level: Low Fall Risk  Fall Risk Patient at Risk for Falls Due to: No Fall Risks Fall risk Follow up: Falls evaluation completed  Home and Transportation Safety: All rugs have non-skid backing?: yes All stairs or steps have railings?: yes Grab bars in the bathtub or shower?: yes Have non-skid surface in bathtub or shower?: yes Good home lighting?: yes Regular seat belt use?: yes Hospital stays in the last year:: no  Cognitive Assessment Difficulty concentrating, remembering, or making decisions? :  yes Will 6CIT or Mini Cog be Completed: yes What year is it?: 0 points What month is it?: 0 points Give patient an address phrase to remember (5 components): the cow jumped over the moon About what time is it?: 0 points Count backwards from 20 to 1: 0 points Say the months of the year in reverse:  0 points Repeat the address phrase from earlier: 0 points 6 CIT Score: 0 points  Advance Directives (For Healthcare) Does Patient Have a Medical Advance Directive?: No Would patient like information on creating a medical advance directive?: No - Patient declined  Reviewed/Updated  Reviewed/Updated: Reviewed All (Medical, Surgical, Family, Medications, Allergies, Care Teams, Patient Goals)    Vision Screening   Right eye Left eye Both eyes  Without correction     With correction 20/40 20/32 20/25       Depression/mood:  Flowsheet Row Office Visit from 10/31/2023 in [facility] [place]  PHQ-9 Total Score 4        Counseling: Counseling given: Not Answered     Lab orders based on risk factors: Laboratory update will be reviewed     Screening: Patient provided with a written and personalized 5-10 year screening schedule in the AVS. Health Maintenance  Topic Date Due   Osteoporosis screening with Bone Density Scan  06/08/2013   Breast Cancer Screening  12/17/2015   COVID-19 Vaccine (1 - 2025-26 season) Never done   Flu Shot  10/21/2024*   Zoster (Shingles) Vaccine (1 of 2) 10/26/2024*   DTaP/Tdap/Td vaccine (1 - Tdap) 07/28/2025*   Pneumococcal Vaccine for age over 50 (1 of 1 - PCV) 07/28/2025*   Medicare Annual Wellness Visit  07/28/2025   Hepatitis C Screening  Completed   Meningitis B Vaccine  Aged Out   Colon Cancer Screening  Discontinued  *Topic was postponed. The date shown is not the original due date.     Provider List Update: Patient Care Team    Relationship Specialty Notifications Start End  Theophilus Andrews, Tully GRADE, MD PCP - General Internal Medicine  07/05/18   Octavia Charleston, MD Referring Physician Ophthalmology  07/09/23   Bonney Melanie SAUNDERS, MD Consulting Physician Dermatology  07/09/23        I have personally reviewed and noted the following in the patients chart:   Medical and social history Use of alcohol, tobacco or  illicit drugs  Current medications and supplements Functional ability and status Nutritional status Physical activity Advanced directives List of other physicians Hospitalizations, surgeries, and ER visits in previous 12 months Vitals Screenings to include cognitive, depression, and falls Referrals and appointments  In addition, I have reviewed and discussed with patient certain preventive protocols, quality metrics, and best practice recommendations. A written personalized care plan for preventive services as well as general preventive health recommendations were provided to patient.   Impression and Plan:  Encounter for Medicare annual wellness exam -     Meloxicam ; Take 1 tablet (7.5 mg total) by mouth daily.  Dispense: 30 tablet; Refill: 0  Vitamin D  deficiency -     VITAMIN D  25 Hydroxy (Vit-D Deficiency, Fractures); Future  Hypertriglyceridemia -     Lipid panel; Future  Thumb pain, left  Postmenopausal estrogen deficiency -     DG Bone Density; Future  Urinary frequency -     Comprehensive metabolic panel with GFR; Future -     CBC with Differential/Platelet; Future -     Urinalysis, Routine w reflex microscopic  Idiopathic  peripheral neuropathy -     TSH; Future -     Vitamin B12; Future   -Recommend routine eye and dental care. -Healthy lifestyle discussed in detail. -Labs to be updated today. -Prostate cancer screening: N/A Health Maintenance  Topic Date Due   Osteoporosis screening with Bone Density Scan  06/08/2013   Breast Cancer Screening  12/17/2015   COVID-19 Vaccine (1 - 2025-26 season) Never done   Flu Shot  10/21/2024*   Zoster (Shingles) Vaccine (1 of 2) 10/26/2024*   DTaP/Tdap/Td vaccine (1 - Tdap) 07/28/2025*   Pneumococcal Vaccine for age over 69 (1 of 1 - PCV) 07/28/2025*   Medicare Annual Wellness Visit  07/28/2025   Hepatitis C Screening  Completed   Meningitis B Vaccine  Aged Out   Colon Cancer Screening  Discontinued  *Topic was  postponed. The date shown is not the original due date.     - DEXA requested. - Declines all cancer screening and immunizations. - For her left thumb pain will treat with meloxicam  for 10 days.    Tully Theophilus Andrews, MD [facility]     [1]  Allergies Allergen Reactions   Azithromycin Nausea And Vomiting   Prochlorperazine Edisylate Other (See Comments)    Tongue spasms(tongue would droop back in to the throat    Tape Rash  [2]  Current Outpatient Medications:    ALPRAZolam  (XANAX ) 0.25 MG tablet, TAKE 1 TABLET BY MOUTH UP TO 5 TIMES A DAY AS NEEDED, Disp: 150 tablet, Rfl: 0   B Complex Vitamins (B COMPLEX PO), Take by mouth daily., Disp: , Rfl:    estradiol  (ESTRACE ) 0.1 MG/GM vaginal cream, Place 1 Applicatorful vaginally 2 (two) times a week., Disp: 127.5 g, Rfl: 0   estradiol  (ESTRACE ) 1 MG tablet, TAKE 1 TABLET BY MOUTH EVERY DAY, Disp: 90 tablet, Rfl: 0   FLUoxetine  (PROZAC ) 10 MG tablet, Take 1 tablet (10 mg total) by mouth daily., Disp: 90 tablet, Rfl: 0   fluticasone  (FLONASE ) 50 MCG/ACT nasal spray, Place 2 sprays into both nostrils daily., Disp: 16 g, Rfl: 6   gabapentin  (NEURONTIN ) 100 MG capsule, TAKE 1 CAPSULE BY MOUTH TWICE A DAY, Disp: 180 capsule, Rfl: 1   ibuprofen (ADVIL,MOTRIN) 600 MG tablet, TAKE ONE TABLET BY MOUTH EVERY 6-8 HOURS AS NEEDED FOR PAIN, Disp: , Rfl: 0   meloxicam  (MOBIC ) 7.5 MG tablet, Take 1 tablet (7.5 mg total) by mouth daily., Disp: 30 tablet, Rfl: 0   traZODone  (DESYREL ) 50 MG tablet, TAKE 1/2 TABLET (25 MG TOTAL) BY MOUTH AT BEDTIME AS NEEDED., Disp: 45 tablet, Rfl: 1  "

## 2024-07-29 ENCOUNTER — Ambulatory Visit: Payer: Self-pay | Admitting: Internal Medicine

## 2024-07-29 DIAGNOSIS — E559 Vitamin D deficiency, unspecified: Secondary | ICD-10-CM

## 2024-07-29 LAB — URINALYSIS, ROUTINE W REFLEX MICROSCOPIC
Bilirubin Urine: NEGATIVE
Hgb urine dipstick: NEGATIVE
Ketones, ur: NEGATIVE
Leukocytes,Ua: NEGATIVE
Nitrite: NEGATIVE
RBC / HPF: NONE SEEN
Specific Gravity, Urine: 1.01 (ref 1.000–1.030)
Total Protein, Urine: NEGATIVE
Urine Glucose: NEGATIVE
Urobilinogen, UA: 0.2 (ref 0.0–1.0)
pH: 7 (ref 5.0–8.0)

## 2024-07-29 LAB — LIPID PANEL
Cholesterol: 165 mg/dL (ref 28–200)
HDL: 74.4 mg/dL
LDL Cholesterol: 73 mg/dL (ref 10–99)
NonHDL: 90.15
Total CHOL/HDL Ratio: 2
Triglycerides: 85 mg/dL (ref 10.0–149.0)
VLDL: 17 mg/dL (ref 0.0–40.0)

## 2024-07-29 LAB — CBC WITH DIFFERENTIAL/PLATELET
Basophils Absolute: 0.1 K/uL (ref 0.0–0.1)
Basophils Relative: 2.2 % (ref 0.0–3.0)
Eosinophils Absolute: 0.1 K/uL (ref 0.0–0.7)
Eosinophils Relative: 1 % (ref 0.0–5.0)
HCT: 37.6 % (ref 36.0–46.0)
Hemoglobin: 12.6 g/dL (ref 12.0–15.0)
Lymphocytes Relative: 27.4 % (ref 12.0–46.0)
Lymphs Abs: 1.6 K/uL (ref 0.7–4.0)
MCHC: 33.6 g/dL (ref 30.0–36.0)
MCV: 86.5 fl (ref 78.0–100.0)
Monocytes Absolute: 0.4 K/uL (ref 0.1–1.0)
Monocytes Relative: 7.6 % (ref 3.0–12.0)
Neutro Abs: 3.6 K/uL (ref 1.4–7.7)
Neutrophils Relative %: 61.8 % (ref 43.0–77.0)
Platelets: 219 K/uL (ref 150.0–400.0)
RBC: 4.35 Mil/uL (ref 3.87–5.11)
RDW: 14 % (ref 11.5–15.5)
WBC: 5.9 K/uL (ref 4.0–10.5)

## 2024-07-29 LAB — COMPREHENSIVE METABOLIC PANEL WITH GFR
ALT: 21 U/L (ref 3–35)
AST: 23 U/L (ref 5–37)
Albumin: 4.2 g/dL (ref 3.5–5.2)
Alkaline Phosphatase: 47 U/L (ref 39–117)
BUN: 18 mg/dL (ref 6–23)
CO2: 31 meq/L (ref 19–32)
Calcium: 9.2 mg/dL (ref 8.4–10.5)
Chloride: 99 meq/L (ref 96–112)
Creatinine, Ser: 0.8 mg/dL (ref 0.40–1.20)
GFR: 71.29 mL/min
Glucose, Bld: 85 mg/dL (ref 70–99)
Potassium: 4.1 meq/L (ref 3.5–5.1)
Sodium: 136 meq/L (ref 135–145)
Total Bilirubin: 0.4 mg/dL (ref 0.2–1.2)
Total Protein: 7.1 g/dL (ref 6.0–8.3)

## 2024-07-29 LAB — TSH: TSH: 1.69 u[IU]/mL (ref 0.35–5.50)

## 2024-07-29 LAB — VITAMIN D 25 HYDROXY (VIT D DEFICIENCY, FRACTURES): VITD: 23.43 ng/mL — ABNORMAL LOW (ref 30.00–100.00)

## 2024-07-29 LAB — VITAMIN B12: Vitamin B-12: 620 pg/mL (ref 211–911)

## 2024-07-29 MED ORDER — VITAMIN D (ERGOCALCIFEROL) 1.25 MG (50000 UNIT) PO CAPS: 50000.0000 [IU] | ORAL_CAPSULE | ORAL | 0 refills | Status: AC

## 2024-08-13 ENCOUNTER — Other Ambulatory Visit: Payer: Self-pay

## 2024-08-13 DIAGNOSIS — F411 Generalized anxiety disorder: Secondary | ICD-10-CM
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
## 2024-08-14 ENCOUNTER — Other Ambulatory Visit: Payer: Self-pay | Admitting: Adult Health

## 2024-08-14 DIAGNOSIS — F411 Generalized anxiety disorder: Secondary | ICD-10-CM

## 2024-08-14 MED ORDER — ALPRAZOLAM 0.25 MG PO TABS: ORAL_TABLET | ORAL | 0 refills | Status: AC

## 2024-08-14 NOTE — Telephone Encounter (Signed)
 Patient has called in to get an update on medication ALPRAZolam  (XANAX ) 0.25 MG tablet. I made her aware of Dr Theophilus states she will ask the pharmacy but patient says she does not want to be stuck without the medication and have withdraws and wants to know if Dr Theophilus can send another prescription in with maybe a smaller quanity until her regular refill. Please call patient back to discuss this matter.  Phone number: [phone]

## 2024-08-26 ENCOUNTER — Other Ambulatory Visit: Payer: Self-pay

## 2024-08-26 ENCOUNTER — Telehealth: Payer: Self-pay | Admitting: *Deleted

## 2024-08-26 ENCOUNTER — Telehealth: Payer: Self-pay

## 2024-08-26 DIAGNOSIS — F411 Generalized anxiety disorder: Secondary | ICD-10-CM

## 2024-08-26 NOTE — Telephone Encounter (Signed)
 Pt was calling back and states she called pharmacy and they told her it would be eligible by tomorrow. Pt just wanted to make Dr. Theophilus aware.

## 2024-08-26 NOTE — Addendum Note (Signed)
 Addended by: KATHRYNE MILLMAN B on: 08/26/2024 12:10 PM   Modules accepted: Orders

## 2024-08-26 NOTE — Addendum Note (Signed)
 Addended by: KATHRYNE MILLMAN B on: 08/26/2024 04:13 PM   Modules accepted: Orders

## 2024-08-26 NOTE — Telephone Encounter (Signed)
 Copied from CRM [id number]. Topic: Clinical - Medication Refill >> Aug 25, 2024 12:29 PM Chasity T wrote: Medication: ALPRAZolam  (XANAX ) 0.25 MG tablet  Has the patient contacted their pharmacy? Yes   This is the patient's preferred pharmacy:  [id number] - [place], [place] - [address] AT [facility] [address] [place] [address] Phone: [phone] Fax: [phone]  Is this the correct pharmacy for this prescription? Yes If no, delete pharmacy and type the correct one.   Has the prescription been filled recently? Yes  Is the patient out of the medication? Yes  Has the patient been seen for an appointment in the last year OR does the patient have an upcoming appointment? Yes  Can we respond through MyChart? Yes  Agent: Please be advised that Rx refills may take up to 3 business days. We ask that you follow-up with your pharmacy. >> Aug 25, 2024 12:31 PM Chasity T wrote: Patient is requesting for a 30 day supply of the medication

## 2024-08-27 ENCOUNTER — Other Ambulatory Visit: Payer: Self-pay | Admitting: Internal Medicine

## 2024-08-27 DIAGNOSIS — F411 Generalized anxiety disorder: Secondary | ICD-10-CM

## 2024-08-27 MED ORDER — ALPRAZOLAM 0.25 MG PO TABS: ORAL_TABLET | ORAL | 0 refills | Status: AC

## 2024-08-27 NOTE — Telephone Encounter (Signed)
 Refill sent
